# Patient Record
Sex: Female | Born: 1970 | Hispanic: Yes | Smoking: Never smoker
Health system: Southern US, Community
[De-identification: ages and names within clinical notes are randomized; demographics above are authoritative.]

## PROBLEM LIST (undated history)

## (undated) DIAGNOSIS — F419 Anxiety disorder, unspecified: Secondary | ICD-10-CM

## (undated) DIAGNOSIS — A6000 Herpesviral infection of urogenital system, unspecified: Secondary | ICD-10-CM

## (undated) DIAGNOSIS — F32A Depression, unspecified: Secondary | ICD-10-CM

## (undated) DIAGNOSIS — D649 Anemia, unspecified: Secondary | ICD-10-CM

## (undated) DIAGNOSIS — K219 Gastro-esophageal reflux disease without esophagitis: Secondary | ICD-10-CM

## (undated) DIAGNOSIS — F329 Major depressive disorder, single episode, unspecified: Secondary | ICD-10-CM

## (undated) HISTORY — DX: Anemia, unspecified: D64.9

## (undated) HISTORY — DX: Anxiety disorder, unspecified: F41.9

## (undated) HISTORY — PX: KNEE SURGERY: SHX244

## (undated) HISTORY — PX: PELVIC LAPAROSCOPY: SHX162

## (undated) HISTORY — DX: Gastro-esophageal reflux disease without esophagitis: K21.9

## (undated) HISTORY — DX: Depression, unspecified: F32.A

## (undated) HISTORY — DX: Herpesviral infection of urogenital system, unspecified: A60.00

## (undated) HISTORY — DX: Major depressive disorder, single episode, unspecified: F32.9

---

## 2001-07-13 ENCOUNTER — Other Ambulatory Visit: Admission: RE | Admit: 2001-07-13 | Discharge: 2001-07-13 | Payer: Self-pay | Admitting: Gynecology

## 2002-01-03 ENCOUNTER — Inpatient Hospital Stay (HOSPITAL_COMMUNITY): Admission: AD | Admit: 2002-01-03 | Discharge: 2002-01-05 | Payer: Self-pay | Admitting: Gynecology

## 2002-02-16 ENCOUNTER — Other Ambulatory Visit: Admission: RE | Admit: 2002-02-16 | Discharge: 2002-02-16 | Payer: Self-pay | Admitting: Gynecology

## 2003-02-25 ENCOUNTER — Other Ambulatory Visit: Admission: RE | Admit: 2003-02-25 | Discharge: 2003-02-25 | Payer: Self-pay | Admitting: Gynecology

## 2004-02-26 ENCOUNTER — Other Ambulatory Visit: Admission: RE | Admit: 2004-02-26 | Discharge: 2004-02-26 | Payer: Self-pay | Admitting: Gynecology

## 2005-03-02 ENCOUNTER — Other Ambulatory Visit: Admission: RE | Admit: 2005-03-02 | Discharge: 2005-03-02 | Payer: Self-pay | Admitting: Gynecology

## 2005-08-31 ENCOUNTER — Encounter (INDEPENDENT_AMBULATORY_CARE_PROVIDER_SITE_OTHER): Payer: Self-pay | Admitting: Specialist

## 2005-08-31 ENCOUNTER — Ambulatory Visit (HOSPITAL_BASED_OUTPATIENT_CLINIC_OR_DEPARTMENT_OTHER): Admission: RE | Admit: 2005-08-31 | Discharge: 2005-08-31 | Payer: Self-pay | Admitting: Gynecology

## 2006-04-27 ENCOUNTER — Other Ambulatory Visit: Admission: RE | Admit: 2006-04-27 | Discharge: 2006-04-27 | Payer: Self-pay | Admitting: Gynecology

## 2006-06-23 ENCOUNTER — Encounter: Admission: RE | Admit: 2006-06-23 | Discharge: 2006-06-23 | Payer: Self-pay | Admitting: Gynecology

## 2007-05-01 ENCOUNTER — Other Ambulatory Visit: Admission: RE | Admit: 2007-05-01 | Discharge: 2007-05-01 | Payer: Self-pay | Admitting: Gynecology

## 2007-06-26 ENCOUNTER — Encounter: Admission: RE | Admit: 2007-06-26 | Discharge: 2007-06-26 | Payer: Self-pay | Admitting: Gynecology

## 2008-07-12 HISTORY — PX: INTRAUTERINE DEVICE INSERTION: SHX323

## 2008-11-28 ENCOUNTER — Encounter: Payer: Self-pay | Admitting: Gynecology

## 2008-11-28 ENCOUNTER — Ambulatory Visit: Payer: Self-pay | Admitting: Gynecology

## 2008-11-28 ENCOUNTER — Other Ambulatory Visit: Admission: RE | Admit: 2008-11-28 | Discharge: 2008-11-28 | Payer: Self-pay | Admitting: Gynecology

## 2009-03-31 ENCOUNTER — Ambulatory Visit: Payer: Self-pay | Admitting: Gynecology

## 2009-04-28 ENCOUNTER — Ambulatory Visit: Payer: Self-pay | Admitting: Gynecology

## 2010-02-16 ENCOUNTER — Encounter: Admission: RE | Admit: 2010-02-16 | Discharge: 2010-02-16 | Payer: Self-pay | Admitting: Family Medicine

## 2010-08-13 ENCOUNTER — Other Ambulatory Visit (HOSPITAL_COMMUNITY)
Admission: RE | Admit: 2010-08-13 | Discharge: 2010-08-13 | Disposition: A | Discharge status: Home / Self Care | Payer: BC Managed Care – PPO | Source: Ambulatory Visit | Attending: Gynecology | Admitting: Gynecology

## 2010-08-13 ENCOUNTER — Encounter: Payer: BC Managed Care – PPO | Admitting: Gynecology

## 2010-08-13 ENCOUNTER — Other Ambulatory Visit: Payer: Self-pay | Admitting: Gynecology

## 2010-08-13 DIAGNOSIS — Z13220 Encounter for screening for lipoid disorders: Secondary | ICD-10-CM

## 2010-08-13 DIAGNOSIS — Z124 Encounter for screening for malignant neoplasm of cervix: Secondary | ICD-10-CM | POA: Insufficient documentation

## 2010-08-13 DIAGNOSIS — R5381 Other malaise: Secondary | ICD-10-CM

## 2010-08-13 DIAGNOSIS — B3731 Acute candidiasis of vulva and vagina: Secondary | ICD-10-CM

## 2010-08-13 DIAGNOSIS — R5383 Other fatigue: Secondary | ICD-10-CM

## 2010-08-13 DIAGNOSIS — Z01419 Encounter for gynecological examination (general) (routine) without abnormal findings: Secondary | ICD-10-CM

## 2010-08-13 DIAGNOSIS — Z833 Family history of diabetes mellitus: Secondary | ICD-10-CM

## 2010-08-13 DIAGNOSIS — B373 Candidiasis of vulva and vagina: Secondary | ICD-10-CM

## 2010-09-21 ENCOUNTER — Other Ambulatory Visit: Payer: Self-pay | Admitting: Gastroenterology

## 2010-09-21 DIAGNOSIS — R7989 Other specified abnormal findings of blood chemistry: Secondary | ICD-10-CM

## 2010-09-25 ENCOUNTER — Ambulatory Visit
Admission: RE | Admit: 2010-09-25 | Discharge: 2010-09-25 | Disposition: A | Discharge status: Home / Self Care | Payer: BC Managed Care – PPO | Source: Ambulatory Visit | Attending: Gastroenterology | Admitting: Gastroenterology

## 2010-09-25 DIAGNOSIS — R7989 Other specified abnormal findings of blood chemistry: Secondary | ICD-10-CM

## 2010-11-27 NOTE — Discharge Summary (Signed)
   Christy Haley, Christy Haley                         ACCOUNT NO.:  [id number]   MEDICAL RECORD NO.:  [id number]                   PATIENT TYPE:  NP   LOCATION:  9133                                 FACILITY:  [facility]   PHYSICIAN:  Devin M. Ciliberti, M.D.            DATE OF BIRTH:  Jan 15, 1971   DATE OF ADMISSION:  01/03/2002  DATE OF DISCHARGE:  01/05/2002                                 DISCHARGE SUMMARY   DISCHARGE DIAGNOSES:  1. Intrauterine pregnancy at term.  2. Spontaneous onset of labor.   PROCEDURE:  Normal spontaneous vaginal delivery of viable infant, over an  intact perineum with repair of second degree vaginal perineal tear.   HISTORY OF PRESENT ILLNESS:  The patient is a 40 year old gravida 3, para 1-  1-0-1, LMP April 05, 2001.  [facility] January 10, 2002.   LABORATORY DATA:  Blood type O-positive.  Antibody screen negative.  RPR and  HBsAg have been nonreactive and AFP normal.   HOSPITAL COURSE:  The patient was admitted on January 03, 2002, with  spontaneous onset of labor.  She progressed to complete dilatation,  delivered an Apgar 9/10 female infant, weight 7 pounds 6 ounces over intact  perineum with repair of second degree vaginal perineal tear.  Postpartum  course was benign.  She remained afebrile and had no difficulty voiding.  Was able to be discharged in satisfactory condition on her second postpartum  day.  CBC: hematocrit 31.6, hemoglobin 10.4, WBC 10.4, platelets 199.   DISPOSITION:  Followup in six weeks.  Continue prenatal vitamins, Motrin,  and Tylox for pain.     Elwyn Lade . Hancock, N.P.                Devin M. Ciliberti, M.D.    MKH/MEDQ  D:  02/08/2002  T:  02/14/2002  Job:  [id number]

## 2010-11-27 NOTE — Op Note (Signed)
NAME:  Christy Haley, Christy Haley     ACCOUNT NO.:  [id number]   MEDICAL RECORD NO.:  [id number]          PATIENT TYPE:  AMB   LOCATION:  [facility]                         FACILITY:  [facility]   PHYSICIAN:  Juan H. Lily Peer, M.D.DATE OF BIRTH:  1971/06/22   DATE OF PROCEDURE:  08/31/2005  DATE OF DISCHARGE:                                 OPERATIVE REPORT   SURGEON:  Juan H. Lily Peer, M.D.   ASSISTANT:  Ivor Costa. Farrel Gobble, M.D.   INDICATIONS:  A 40 year old, gravida 3, para 2, AB 1 with a persistent left  adnexal mass measuring 6.6 x 6.9 x 4.6 cm, echo-free and avascular.   PREOPERATIVE DIAGNOSIS:  Left adnexal mass.   POSTOPERATIVE DIAGNOSIS:  Left ovarian cyst.   OPERATION/PROCEDURE:  1.  Pelvic washings.  2.  Left ovarian cyst aspiration.  3.  Left ovarian cystectomy.   DESCRIPTION OF PROCEDURE:  After the patient was adequately counseled, she  was taken to the operating room where she underwent a successful general  endotracheal anesthesia.  Her abdomen, vagina and perineum were prepped and  draped in the usual sterile fashion.  A Foley catheter was then placed and  in an effort to monitor uterine output.  A ring forceps with a gauze was  placed into the vaginal vault due to the fact that the patient has a Mirena  IUD so a Hulka tenaculum was not utilized.  The patient was placed in the  high lithotomy position.  A small stab incision was made at the umbilicus  followed by insertion of a Veress needle.  Opening intraabdominal pressure  was 80 mmHg and approximately 3.5 L of carbon dioxide was placed in the  peritoneal cavity.  The Veress needle was removed and a 10 mm trocar was  inserted and under laparoscopic guidance, two additional 5 mm ports were  introduced approximately four fingerbreadths from the midline in the lower  abdomen.  A systematic inspection demonstrated a left ovarian cyst that  measured approximately 5 cm in size, normal tubes bilaterally with lush  fimbriated  end.  Contralateral ovary was normal.  There was amber-colored  fluid in the cul-de-sac which was aspirated after copious irrigation of the  pelvic cavity and fluid submitted for cytology.   Attention was then placed to the left utero-ovarian ligament which was  placed under tension and the ovary capsule was incised after the cyst had  been aspirated for approximately 7 mL of amber-colored fluid.  After the  cyst decompressed, the cyst wall was removed and excised and passed off the  operative field for histologic evaluation.  In the remaining ovarian bed,  small bleeders were cauterized.  After ascertaining adequate hemostasis, the  remainder of the pelvic cavity was copiously irrigated with normal saline  solution.  After the carbon dioxide was removed, the instruments were  removed.  The subumbilical fascia was closed with figure-of-eight and 0  Vicryl suture and a subcuticular stitch.  An 0 plain catgut was utilized.  Two 5 mm ports sites were reapproximated with Dermabond glue.  Marcaine  0.25% was utilized for postoperative analgesia for a total of 10 mL.  Of  note, the patient  had PSA  stockings.  She received 1 g of Cefotan for prophylaxis.  The patient  received 30 mg of Toradol and was transferred to the recovery room with  stable vital signs.  Blood loss was minimal.  Fluid resuscitation consisted  of 1500 mL of lactated Ringer's for an outpatient of approximately 200 mL.      Juan H. Lily Peer, M.D.  Electronically Signed     JHF/MEDQ  D:  08/31/2005  T:  08/31/2005  Job:  [id number]

## 2010-11-27 NOTE — H&P (Signed)
NAME:  Christy Haley, Christy Haley NO.:  [id number]   MEDICAL RECORD NO.:  [id number]          PATIENT TYPE:   LOCATION:                                 FACILITY:   PHYSICIAN:  Juan H. Lily Peer, M.D.     DATE OF BIRTH:   DATE OF ADMISSION:  08/31/2005  DATE OF DISCHARGE:                                HISTORY & PHYSICAL   DATE OF SCHEDULED SURGERY:  Tuesday, August 31, 2005, at 1 p.m. at [facility].   CHIEF COMPLAINT:  Left adnexal  mass.   HISTORY:  The patient is a 40 year old gravida 3, para 2, Ab 1 who was seen  in the office on August 22 for annual gynecological examination. The patient  with a Mirena IUD placed in September 2005 for contraception. At the time of  her exam, a left adnexal fullness was noted and the patient subsequently was  asked to return for an ultrasound, which she did on September 7. The  ultrasound demonstrated a thin-walled echo-free cyst measuring 30 x 35 x 35  mm and avascular on the left. The right ovary was normal and the IUD was in  place. She was asked to return back in 3 months, thinking that this perhaps  is a functional cyst. She returned back to the office on January 9  complaining of chronic left lower quadrant pain and the ultrasound had  demonstrated that the cyst had increased in size to a measurement of 66 x 69  x 46 mm, echo-free, and avascular. The patient is scheduled to undergo  laparoscopic left ovarian cystectomy with possible left salpingo-  oophorectomy.   PAST MEDICAL HISTORY:  She has been pregnant three times, has two children  delivered vaginally, one miscarriage. She has an IUD in place. She denies  any allergies. Grandmother with history of diabetes. Otherwise, she has been  healthy.   PHYSICAL EXAMINATION:  VITAL SIGNS:  The patient weighs 160 pounds, height 5  feet 3 inches tall. Blood pressure 120/62.  HEENT:  Unremarkable.  NECK:  Supple, trachea midline. No carotid bruits, no  thyromegaly.  LUNGS:  Clear to auscultation without rhonchi or wheezes.  HEART:  Regular rate and rhythm, no murmurs or gallop.  BREAST:  Exam not done.  ABDOMEN:  Soft and nontender without rebound or guarding.  PELVIC:  Bartholin, urethra, Skene glands within normal limits. Vagina and  cervix:  No gross lesion on inspection. Uterus:  Anteverted; normal size,  shape and consistency, and left adnexal.  RECTAL:  Confirmatory.   ASSESSMENT:  A 40 year old gravida 3 para 2 abortus 1 with Mirena  intrauterine device in place. At time of annual gynecological examination on  March 02, 2005, was found to have a left adnexal fullness, followed up with  an ultrasound on September 7 with dimensions consistent with a 3.5 cm left  ovarian cyst and avascular. The patient returned to the office on July 20, 2005, for a follow-up ultrasound and the cyst had doubled in size. It was  measuring 66 x 69 x 46 mm, echo-free, and avascular. The patient is  scheduled to undergo a laparoscopic left ovarian  cystectomy, possible left  salpingo-oophorectomy. The risks, benefits, and pros and cons were discussed  with the patient to include the risk of infection although she will receive  prophylactic antibiotic; the risk for deep venous thrombosis, she will have  PSA stockings; also, the risk for hemorrhage and possible need for blood  products and blood transfusion with a potential risk of anaphylactic  reaction, hepatitis, and AIDS; and finally, in the event of any technical  difficulty gaining entrance to the abdominal cavity laparoscopically, an  open laparotomy technique may need to be utilized to complete the surgery;  also, potential risk for trauma to internal organs as discussed as well. The  patient could potentially lose her left tube and ovary and will still have  her right tube and ovary remaining. All these issues were discussed with the  patient, all questions were answered, and will follow  accordingly.   PLAN:  The patient is scheduled for laparoscopic left ovarian cystectomy,  possible left salpingo-oophorectomy, Tuesday, August 31, 2005, at 1 p.m.  at [facility].      Juan H. Lily Peer, M.D.  Electronically Signed     JHF/MEDQ  D:  08/30/2005  T:  08/30/2005  Job:  [id number]

## 2011-04-12 ENCOUNTER — Encounter: Payer: Self-pay | Admitting: Gynecology

## 2011-04-12 ENCOUNTER — Other Ambulatory Visit (HOSPITAL_COMMUNITY)
Admission: RE | Admit: 2011-04-12 | Discharge: 2011-04-12 | Disposition: A | Discharge status: Home / Self Care | Payer: BC Managed Care – PPO | Source: Ambulatory Visit | Attending: Gynecology | Admitting: Gynecology

## 2011-04-12 ENCOUNTER — Ambulatory Visit (INDEPENDENT_AMBULATORY_CARE_PROVIDER_SITE_OTHER): Payer: BC Managed Care – PPO | Admitting: Gynecology

## 2011-04-12 ENCOUNTER — Encounter: Payer: Self-pay | Admitting: Anesthesiology

## 2011-04-12 VITALS — BP 114/72

## 2011-04-12 DIAGNOSIS — R87619 Unspecified abnormal cytological findings in specimens from cervix uteri: Secondary | ICD-10-CM

## 2011-04-12 DIAGNOSIS — Z01419 Encounter for gynecological examination (general) (routine) without abnormal findings: Secondary | ICD-10-CM | POA: Insufficient documentation

## 2011-04-12 NOTE — Progress Notes (Signed)
Patient 40 year old gravida 3 para 2 AB 1 who presented to the office today is instructed as a result of her Pap smear done at time of her annual exam with every second 2012 which demonstrated benign reactive reparative changes although negative for intraepithelial lesions or malignancy. She is doing well otherwise with regular cycles and has a Mirena IUD that was placed in 04/05/2009. She is to schedule mammogram the next few weeks. She is instructed to continue monthly self breast examination. Her Pap smear was repeated today. She'll return back in 6 months for routine schedule annual gynecological examination or when necessary. Of note the IUD string was seen at time of the Pap smear today.

## 2011-04-12 NOTE — Patient Instructions (Signed)
Recuerdate de hacer cita para el mammograma. Proxima cita para el annual con Dr. Lily Peer en Abril 2013

## 2011-04-14 ENCOUNTER — Other Ambulatory Visit: Payer: Self-pay | Admitting: Gynecology

## 2011-04-14 DIAGNOSIS — Z1231 Encounter for screening mammogram for malignant neoplasm of breast: Secondary | ICD-10-CM

## 2011-05-13 ENCOUNTER — Ambulatory Visit
Admission: RE | Admit: 2011-05-13 | Discharge: 2011-05-13 | Disposition: A | Discharge status: Home / Self Care | Payer: BC Managed Care – PPO | Source: Ambulatory Visit | Attending: Gynecology | Admitting: Gynecology

## 2011-05-13 ENCOUNTER — Ambulatory Visit: Payer: BC Managed Care – PPO

## 2011-05-13 DIAGNOSIS — Z1231 Encounter for screening mammogram for malignant neoplasm of breast: Secondary | ICD-10-CM

## 2011-10-12 ENCOUNTER — Encounter: Payer: BC Managed Care – PPO | Admitting: Gynecology

## 2012-01-06 DIAGNOSIS — K219 Gastro-esophageal reflux disease without esophagitis: Secondary | ICD-10-CM | POA: Insufficient documentation

## 2012-01-14 ENCOUNTER — Ambulatory Visit (INDEPENDENT_AMBULATORY_CARE_PROVIDER_SITE_OTHER): Payer: BC Managed Care – PPO | Admitting: Gynecology

## 2012-01-14 ENCOUNTER — Encounter: Payer: Self-pay | Admitting: Gynecology

## 2012-01-14 VITALS — BP 120/78 | Ht 62.25 in | Wt 142.0 lb

## 2012-01-14 DIAGNOSIS — Z8489 Family history of other specified conditions: Secondary | ICD-10-CM

## 2012-01-14 DIAGNOSIS — Z833 Family history of diabetes mellitus: Secondary | ICD-10-CM | POA: Insufficient documentation

## 2012-01-14 DIAGNOSIS — N898 Other specified noninflammatory disorders of vagina: Secondary | ICD-10-CM

## 2012-01-14 DIAGNOSIS — D72819 Decreased white blood cell count, unspecified: Secondary | ICD-10-CM

## 2012-01-14 DIAGNOSIS — R635 Abnormal weight gain: Secondary | ICD-10-CM

## 2012-01-14 DIAGNOSIS — Z01419 Encounter for gynecological examination (general) (routine) without abnormal findings: Secondary | ICD-10-CM

## 2012-01-14 LAB — COMPREHENSIVE METABOLIC PANEL
ALT: 12 U/L (ref 0–35)
AST: 15 U/L (ref 0–37)
Albumin: 4.2 g/dL (ref 3.5–5.2)
Alkaline Phosphatase: 34 U/L — ABNORMAL LOW (ref 39–117)
BUN: 22 mg/dL (ref 6–23)
CO2: 28 mEq/L (ref 19–32)
Calcium: 9.3 mg/dL (ref 8.4–10.5)
Chloride: 105 mEq/L (ref 96–112)
Creat: 0.68 mg/dL (ref 0.50–1.10)
Glucose, Bld: 87 mg/dL (ref 70–99)
Potassium: 4.6 mEq/L (ref 3.5–5.3)
Sodium: 139 mEq/L (ref 135–145)
Total Bilirubin: 0.5 mg/dL (ref 0.3–1.2)
Total Protein: 6.3 g/dL (ref 6.0–8.3)

## 2012-01-14 LAB — LIPID PANEL
Cholesterol: 139 mg/dL (ref 0–200)
HDL: 55 mg/dL (ref >39)
LDL Cholesterol: 76 mg/dL (ref 0–99)
Total CHOL/HDL Ratio: 2.5 Ratio
Triglycerides: 41 mg/dL (ref <150)
VLDL: 8 mg/dL (ref 0–40)

## 2012-01-14 LAB — WET PREP FOR TRICH, YEAST, CLUE
Trich, Wet Prep: NONE SEEN
Yeast Wet Prep HPF POC: NONE SEEN

## 2012-01-14 LAB — CBC WITH DIFFERENTIAL/PLATELET
Basophils Absolute: 0 10*3/uL (ref 0.0–0.1)
Basophils Relative: 0 % (ref 0–1)
Eosinophils Absolute: 0.1 10*3/uL (ref 0.0–0.7)
Eosinophils Relative: 2 % (ref 0–5)
HCT: 36.6 % (ref 36.0–46.0)
Hemoglobin: 12.1 g/dL (ref 12.0–15.0)
Lymphocytes Relative: 30 % (ref 12–46)
Lymphs Abs: 1 10*3/uL (ref 0.7–4.0)
MCH: 27.7 pg (ref 26.0–34.0)
MCHC: 33.1 g/dL (ref 30.0–36.0)
MCV: 83.8 fL (ref 78.0–100.0)
Monocytes Absolute: 0.3 10*3/uL (ref 0.1–1.0)
Monocytes Relative: 9 % (ref 3–12)
Neutro Abs: 1.9 10*3/uL (ref 1.7–7.7)
Neutrophils Relative %: 59 % (ref 43–77)
Platelets: 283 10*3/uL (ref 150–400)
RBC: 4.37 MIL/uL (ref 3.87–5.11)
RDW: 13.8 % (ref 11.5–15.5)
WBC: 3.1 10*3/uL — ABNORMAL LOW (ref 4.0–10.5)

## 2012-01-14 LAB — TSH: TSH: 1.246 u[IU]/mL (ref 0.350–4.500)

## 2012-01-14 NOTE — Patient Instructions (Addendum)
Mantenimiento de Engineer, maintenance (IT) las mujeres (Health Maintenance, Females) Un estilo de vida saludable y los cuidados preventivos pueden favorecer la salud y [place].   Haga exmenes regulares de la salud en general, dentales y de los ojos.   Consuma una dieta saludable. Los [employer], frutas, granos enteros, productos lcteos descremados y protenas magras contienen los nutrientes que usted necesita sin necesidad de consumir muchas caloras. Disminuya el consumo de alimentos con alto contenido de grasas slidas, azcar y sal agregadas. Si es necesario, pdaleinformacin acerca de una dieta [place] a su mdico.   La actividad fsica regular es una de las cosas ms importantes que puede hacer por su salud. Los adultos deben hacer al menos 150 minutos de ejercicios de intensidad moderada (cualquier actividad que aumente la frecuencia cardaca y lo haga transpirar) cada semana. Adems, la [employer]son de los adultos necesita ejercicios de fortalecimiento muscular 2  ms [employer].    Mantenga un peso saludable. El ndice de masa corporal [facility]) es una herramienta que identifica posibles problemas con [place]. Proporciona una estimacin de la grasa corporal basndose en el peso y la altura. El mdico podr determinar su [facility] y podr ayudarlo a Personnel officer o Pharmacologist un peso saludable. Para los adultos de 20 aos o ms:   Un [facility] menor a 18,5 se considera bajo peso.   Un [facility] entre 18,5 y 24,9 es normal.   Un [facility] entre 25 y 29,9 es sobrepeso.   Un IMC entre 30 o ms es obesidad.   Mantenga un nivel normal de lpidos y colesterol en sangre practicando actividad fsica y minimizando la ingesta de grasas saturadas. Consuma una dieta balanceada e incluya variedad de frutas y vegetales. Los [employer] de lpidos y Oncologist en sangre deben Games developer a los 20 aos y repetirse cada 5 aos. Si los niveles de colesterol son altos, tiene ms de 50 aos o tiene riesgo elevado de sufrir enfermedades  cardacas, Pension scheme manager controlarse con ms frecuencia.Si tiene [employer] de lpidos y colesterol, debe recibir tratamiento con medicamentos, si la dieta y el ejercicio no son efectivos.   Si fuma, consulte con [place] [employer] de las opciones para dejar de [place]. Si no lo hace, no comience.   Si est embarazada no beba alcohol. Si est amamantando, beba alcohol con prudencia. Si elige beber alcohol, no se exceda de 1 medida por da. Se considera una medida a 12 onzas (355 ml) de cerveza, 5 onzas (148 ml) de vino, o 1,5 onzas (44 ml) de licor.   Evite el alcohol y el consumo de drogas. No comparta agujas. Pida ayuda si necesita asistencia o instrucciones con respecto a abandonar el consumo de alcohol, cigarrillos o drogas.   La hipertensin arterial causa enfermedades cardacas y [place] el riesgo de ictus. Debe controlar su presin arterial al menos cada 1 o 2 aos. La presin arterial elevada que persiste debe tratarse con medicamentos si la prdida de peso y el ejercicio no son efectivos.   Si tiene entre 55 y 63 aos, consulte a su mdico si debe tomar aspirina para prevenir enfermedades cardacas.   Los anlisis para la diabetes incluyen la toma de [place] de sangre para controlar el nivel de azcar en la sangre durante el [employer]. Debe hacerlo cada 3 aos despus de los 45 aos si est dentro de su peso normal y sin factores de riesgo para la diabetes. Las pruebas deben comenzar a edades tempranas o llevarse a cabo con ms frecuencia  si tiene sobrepeso y al menos 1 factor de riesgo para la diabetes.   Las evaluaciones para Market researcher de mama son un mtodo preventivo fundamental para las mujeres. Debe practicar la "autoconciencia de las mamas". Esto significa que Product/process development scientist apariencia normal de sus mamas y [employer] siente y pudiendo incluir un autoexamen de Building control surveyor. Si detecta algn cambio, no importa cun pequeo sea, debe informarlo a su mdico. Las mujeres entre 20 y  40 aos deben hacer un examen clnico de las mamas como parte del examen regular de [place], cada 1 a 3 aos. Despus de los 40 aos deben [employer]. Deben hacerse una mamografa radografa de mamas ) cada ao, comenzando a los 40 aos. Las mujeres con Brewing technologist de cncer de mama deben hablar con el mdico para hacer un estudio gentico. Las que tienen ms riesgo deben Geographical information systems officer resonancia magntica y [employer] todos [place].   Un test de Pap se realiza para diagnosticar cncer de cuello de tero. Las mujeres deben [employer] un test de Pap a partir de los [address]. [employer] 21 y los 29 aos debe repetirse [address]. Luego de los 30 aos, debe realizarse un test de Pap cada tres aos siempre que los 3 estudios anteriores sean normales. Si le han realizado una histerectoma por un problema que no era cncer u otra enfermedad que podra causar cncer, ya no necesitar un test de Pap. Si tiene entre 65 y 43 aos y ha tenido Scientist, research (physical sciences) de Pap normal en los ltimos 10 aos, ya no ser Music therapist. Si ha recibido un tratamiento para Management consultant cervical o para una enfermedad que podra causar cncer, Musician un test de Pap y controles durante al menos 20 aos de concluir el Olivia Lopez de Gutierrez. Si no se ha Patent attorney con regularidad, Writer a evaluarse los factores de riesgo (como el tener un nuevo compaero sexual) para Occupational psychologist a Printmaker. Algunas mujeres sufren problemas mdicos que aumentan la probabilidad de Research officer, political party cervical. En estos casos, el mdico podr indicar que se realice el test de Pap con ms frecuencia.   La prueba del virus del Geneticist, molecular (VPH) es un anlisis adicional que puede usarse para Engineer, site de cuello de tero. Esta prueba busca la presencia del virus que causa los cambios en el cuello. Las clulas que se recolectan durante el test de Pap pueden usarse para el VPH. La prueba para el VPH puede  usarse para Development worker, community a mujeres de ms de 30 aos y debe usarse en mujeres de cualquier edad Cisco del test de Pap no sean claros. Despus de los 30 aos, las mujeres deben hacerse el anlisis para el VPH con la misma frecuencia que el test de Pap.   El cncer colorectal puede detectarse y con frecuencia puede prevenirse. La mayor parte de los estudios de rutina comienzan a los 50 aos y [place] 75 aos. Sin embargo, el mdico podr aconsejarle que lo haga antes, si tiene factores de riesgo para el cncer de colon. Una vez por ao, el profesional le dar un kit de prueba para Scientist, product/process development en la materia fecal. La utilizacin de un tubo con una pequea cmara en su extremo para examinar directamente el colon (sigmoidoscopa o colonoscopa), puede detectar formas temprana de cncer colorectal. Hable con su mdico si tiene 50 aos, cuando comience con los estudios de [place]. El examen directo del  colon debe repetirse cada 5 a 10 aos, hasta los 75 aos, excepto que se encuentren formas tempranas de plipos precancerosos o pequeos bultos.   Se recomienda realizar un anlisis de sangre para Engineer, manufacturing hepatitis C a todas las personas [address] 1945 y 1965, y a todo aquel que tenga un riesgo conocido de haber contrado esta enfermedad.   Practique el sexo seguro. Use condones y evite las prcticas sexuales riesgosas para disminuir el contagio de enfermedades de transmisin sexual. Las mujeres sexualmente activas de 25 aos o menos deben controlarse para descartar clamidia, que es una infeccin de transmisin sexual frecuente. Las [employer] que tengan mltiples compaeros tambin deben hacerse el anlisis para Engineer, manufacturing clamidia. Se recomienda realizar anlisis para detectar otras enfermedades de transmisin sexual si es sexualmente [place] y tiene riesgos.   La osteoporosis es una enfermedad en la que los huesos pierden los minerales y la fuerza por el avance de la edad. El  resultado pueden ser fracturas graves en los [place]. El riesgo de osteoporosis puede identificarse con Neomia Dear prueba de densidad sea. Las mujeres de ms de 65 aos y las que tengan riesgos de sufrir fracturas u osteoporosis deben pedir consejo a su mdico. Consulte a su mdico si debe tomar un suplemento de calcio o de vitamina D para reducir el riesgo de osteoporosis.  La menopausia se asocia a sntomas y riesgos fsicos. Se dispone de una terapia de reemplazo hormonal para disminuir los sntomas y [place]. Consulte a su mdico para saber si laVaginosis bacteriana (Bacterial Vaginosis) La vaginosis bacteriana es una infeccin vaginal en la que el equilibrio normal de las bacterias de la vagina se modifica. Este equilibrio normal se ve afectado por un desarrollo excesivo de ciertas bacterias. Hay diferentes tipos de bacteria que causan la vaginosis bacteriana. Es el problema vaginal ms comn en las mujeres de edad frtil. CAUSAS La causa de este trastorno no se conoce bien. Se produce como consecuencia de un aumento o desequilibrio de las bacterias nocivas.  Algunas actividades o conductas pueden poner en peligro el equilibrio normal de las bacterias en la vagina, y Astronomer. Entre ellas:  Tener un compaero sexual o mltiples compaeros sexuales.  Las duchas vaginales  Usar un dispositivo intrauterino (DIU) como mtodo anticonceptivo.  No se conoce el papel que juega la actividad sexual en el desarrollo de [place] VB. Sin embargo, las mujeres que nunca tuvieron relaciones sexuales raramente se infectan.  El contagio no se produce en asientos de baos, camas, piscinas o por tocar objetos.  SNTOMAS Flujo vaginal grisceo.  Olor parecido al pescado con la secrecin, en especial despus de Management consultant.  Picazn o irritacin de la vagina y la vulva.  Ardor o dolor al [employer].  Algunas mujeres no presentan ningn sntoma.  DIAGNSTICO El mdico realizar un examen vaginal para  diagnosticar una vaginosis bacteriana. El mdico le indicar anlisis de laboratorio y observar las muestras del lquido vaginal en el microscopio. Buscar bacterias y clulas anormales (clulas clave), pH mayor a 4.5 y [place] prueba de aminas positivo, todos ellos asociados al BV.  RIESGOS Y COMPLICACIONES Enfermedad plvica inflamatoria (EPI).  Infecciones luego de una ciruga ginecolgica.  VIH.  Virus del Herpes  TRATAMIENTO En algunos casos, la infeccin desaparece sin tratamiento. Sin embargo, todas las mujeres con sntomas de VB deben tratarse para evitar complicaciones, especialmente si se ha planificado una ciruga ginecolgica. Los compaeros varones generalmente no necesitan tratamiento. Sin embargo, puede contagiarse entre parejas femeninas, de [place]  el tratamiento se realiza para Dietitian.  La VB puede tratarse con medicamentos que destruyen grmenes (antibiticos). Estos se presentan en pldoras o en cremas vaginales. Tanto mujeres embarazadas como no embarazadas pueden usar ambos, pero se indican en dosis diferentes. Estos antibiticos no daan al beb.  La VB puede recurrir [employer]. Si esto ocurre, se prescribir un segundo tratamiento con antibiticos.  El tratamiento es importante en el caso de las mujeres [place]. Si no se trata, la VB puede causar Coca[employer]a, especialmente en [employer] que ha tenido un parto prematuro en el pasado. Todas las mujeres embarazadas que tienen sntomas de VB deben ser controladas y tratadas.  En los casos de recurrencia crnica, se prescribe un tratamiento con un gel vaginal dos veces por semana  INSTRUCCIONES PARA EL CUIDADO DOMICILIARIO Tome los medicamentos que le indic el mdico.  No mantenga relaciones sexuales Librarian, academic.  Comunique a sus compaeros sexuales que sufre una infeccin vaginal. Ellos deben concurrir para un control mdico si tienen problemas como una urticaria leve o picazn.    Practique el sexo seguro. Use preservativos. Tenga un solo compaero sexual.  PREVENCIN Algunos pasos bsicos de prevencin pueden ayudar a reducir el riesgo de desequilibrio de las bacterias vaginales y de sufrir VB. No mantener relaciones sexuales (abstinencia)  No utilice duchas vaginales.  Utilice todos los Cardinal Health han prescripto para el [place], aunque los sntomas hayan desaparecido.  Comunique a su compaero sexual que sufre una VB. De ese modo podr tratase, si es necesario, y podr Economist.  SOLICITE ATENCIN MDICA SI: Los sntomas no mejoran luego de [address]  [address] de [place].  Aumentan la secrecin, el dolor o la fiebre.  ASEGRESE QUE:  Comprende estas instrucciones.  Controlar su enfermedad.  Solicitar ayuda de inmediato si no mejora o empeora.  PARA MS INFORMACIN: Division de STD Prevention (DSTDP), Centers for Disease Control and Prevention (Centros para el control y la prevencin de enfermedades, CDC): SolutionApps.co.za American Social Health Association (ASHA): www.ashastd.org  Document Released: 10/05/2007 Document Revised: 06/17/2011  [facility] Patient Information 2012 [place], [place]. terapia de reemplazo hormonal es conveniente para usted.   Use una pantalla solar con un factor SPF de 30 o mayor. Aplique pantalla de Pietro Cassis y repetida a lo largo del Futures trader. Pngase al resguardo del sol cuando la sombra sea ms pequea que usted. Protjase usando mangas y [employer], un sombrero de ala ancha y gafas para el sol todo el ao, siempre que se encuentre en el exterior.   Informe a su mdico si aparecen nuevos lunares o los que tiene se modifican, especialmente en forma y color. Tambin notifique al mdico si un lunar es ms grande que el tamao de una goma de Paramedic.   Mantngase al da con las vacunas.  Document Released: 06/17/2011 [facility] Patient Information 2012 [place], [place].

## 2012-01-14 NOTE — Progress Notes (Signed)
Christy Haley 05-03-1971 [id number]   History:    41 y.o.  for annual gyn exam who stated that and she had recently seen her gastroenterologist and was told that she had leukopenia. We do not have those results. Patient has a Mirena IUD placed in 2010. She at time suffers from leukorrhea. Her cycles otherwise regular. She does suffer from mild melasma which she has for many years even before she had a Mirena IUD placed. She is exposed sunlight quite often. Her last mammogram was normal November 2012. Patient does her monthly self breast examination. Review of her record indicated she was weighing 129 is up to 142 now.  Past medical history,surgical history, family history and social history were all reviewed and documented in the EPIC chart.  Gynecologic History No LMP recorded. Patient is not currently having periods (Reason: IUD). Contraception: IUD Last Pap: 2012. Results were: normal Last mammogram: November 2012. Results were: normal  Obstetric History OB History    Grav Para Term Preterm Abortions TAB SAB Ect Mult Living   3 2   1  1   2      # Outc Date GA Lbr Len/2nd Wgt Sex Del Anes PTL Lv   1 PAR     F SVD      2 PAR     F SVD      3 SAB                ROS: A ROS was performed and pertinent positives and negatives are included in the history.  GENERAL: No fevers or chills. HEENT: No change in vision, no earache, sore throat or sinus congestion. NECK: No pain or stiffness. CARDIOVASCULAR: No chest pain or pressure. No palpitations. PULMONARY: No shortness of breath, cough or wheeze. GASTROINTESTINAL: No abdominal pain, nausea, vomiting or diarrhea, melena or bright red blood per rectum. GENITOURINARY: No urinary frequency, urgency, hesitancy or dysuria. MUSCULOSKELETAL: No joint or muscle pain, no back pain, no recent trauma. DERMATOLOGIC: No rash, no itching, no lesions. ENDOCRINE: No polyuria, polydipsia, no heat or cold intolerance. No recent change in weight. HEMATOLOGICAL: No  anemia or easy bruising or bleeding, leukopenia?Marland Kitchen NEUROLOGIC: No headache, seizures, numbness, tingling or weakness. PSYCHIATRIC: No depression, no loss of interest in normal activity or change in sleep pattern.     Exam: chaperone present  BP 120/78  Ht 5' 2.25" (1.581 m)  Wt 142 lb (64.411 kg)  BMI 25.76 kg/m2  Body mass index is 25.76 kg/(m^2).  General appearance : Well developed well nourished female. No acute distress HEENT: Neck supple, trachea midline, no carotid bruits, no thyroidmegaly Lungs: Clear to auscultation, no rhonchi or wheezes, or rib retractions  Heart: Regular rate and rhythm, no murmurs or gallops Breast:Examined in sitting and supine position were symmetrical in appearance, no palpable masses or tenderness,  no skin retraction, no nipple inversion, no nipple discharge, no skin discoloration, no axillary or supraclavicular lymphadenopathy Abdomen: no palpable masses or tenderness, no rebound or guarding Extremities: no edema or skin discoloration or tenderness  Pelvic:  Bartholin, Urethra, Skene Glands: Within normal limits             Vagina: No gross lesions or discharge, odor  Cervix: No gross lesions or discharge, IUD string seen  Uterus  anteverted, normal size, shape and consistency, non-tender and mobile  Adnexa  Without masses or tenderness  Anus and perineum  normal   Rectovaginal  normal sphincter tone without palpated masses or tenderness  Hemoccult not done   Wet prep: Is is to count bacteria and positive amine  Assessment/Plan:  41 y.o. female for annual exam we discussed today the new Pap smear screening guidelines and will not need one for 2 more years. Because of her weight gain in her family history of diabetes we'll do a fasting lipid profile today along with a comprehensive metabolic panel, CBC, urinalysis, and TSH. She was concerned about potential long-term affects from Nexium such as fractures and bone instability. We discussed  importance of calcium and vitamin D for osteoporosis prevention and her to continue to engage in weightbearing exercises for 45 minutes 3-4 times a week. She wanted to be referred to a different gastroenterologist in the name will be provided as well as for a new internist since her previous one recently retired. Patient will be prescribed Flagyl 500 mg to take 1 by mouth twice a day for 5 days for bacterial vaginosis. We'll wait for the results of the CBC and look at her white blood count and if it does indeed demonstrate severe leukopenia she will need to be referred to hematologist.   Ok Edwards MD, 9:04 AM 01/14/2012

## 2012-01-15 LAB — URINALYSIS W MICROSCOPIC + REFLEX CULTURE
Bacteria, UA: NONE SEEN
Bilirubin Urine: NEGATIVE
Casts: NONE SEEN
Crystals: NONE SEEN
Glucose, UA: NEGATIVE mg/dL
Hgb urine dipstick: NEGATIVE
Ketones, ur: NEGATIVE mg/dL
Leukocytes, UA: NEGATIVE
Nitrite: NEGATIVE
Protein, ur: NEGATIVE mg/dL
Specific Gravity, Urine: 1.017 (ref 1.005–1.030)
Squamous Epithelial / LPF: NONE SEEN
Urobilinogen, UA: 0.2 mg/dL (ref 0.0–1.0)
pH: 6.5 (ref 5.0–8.0)

## 2012-01-19 ENCOUNTER — Telehealth: Payer: Self-pay | Admitting: *Deleted

## 2012-01-19 MED ORDER — METRONIDAZOLE 500 MG PO TABS: 500.0000 mg | ORAL_TABLET | Freq: Two times a day (BID) | ORAL | Status: AC

## 2012-01-19 NOTE — Telephone Encounter (Signed)
pharmacy never received flagyl 500 mg 1 po x 5 days.

## 2012-01-26 LAB — HM PAP SMEAR: HM Pap smear: NORMAL

## 2012-01-27 ENCOUNTER — Encounter: Payer: Self-pay | Admitting: Physician Assistant

## 2012-01-27 DIAGNOSIS — K219 Gastro-esophageal reflux disease without esophagitis: Secondary | ICD-10-CM

## 2012-04-05 ENCOUNTER — Ambulatory Visit: Payer: BC Managed Care – PPO | Admitting: Family Medicine

## 2012-04-26 LAB — HM MAMMOGRAPHY: HM Mammogram: NORMAL

## 2012-04-27 ENCOUNTER — Other Ambulatory Visit: Payer: Self-pay | Admitting: Gynecology

## 2012-04-27 DIAGNOSIS — Z1231 Encounter for screening mammogram for malignant neoplasm of breast: Secondary | ICD-10-CM

## 2012-05-30 ENCOUNTER — Ambulatory Visit
Admission: RE | Admit: 2012-05-30 | Discharge: 2012-05-30 | Disposition: A | Discharge status: Home / Self Care | Payer: BC Managed Care – PPO | Source: Ambulatory Visit | Attending: Gynecology | Admitting: Gynecology

## 2012-05-30 DIAGNOSIS — Z1231 Encounter for screening mammogram for malignant neoplasm of breast: Secondary | ICD-10-CM

## 2012-12-07 ENCOUNTER — Encounter: Payer: Self-pay | Admitting: Internal Medicine

## 2012-12-07 ENCOUNTER — Ambulatory Visit (INDEPENDENT_AMBULATORY_CARE_PROVIDER_SITE_OTHER): Payer: BC Managed Care – PPO | Admitting: Internal Medicine

## 2012-12-07 ENCOUNTER — Ambulatory Visit (INDEPENDENT_AMBULATORY_CARE_PROVIDER_SITE_OTHER)
Admission: RE | Admit: 2012-12-07 | Discharge: 2012-12-07 | Disposition: A | Discharge status: Home / Self Care | Payer: BC Managed Care – PPO | Source: Ambulatory Visit | Attending: Internal Medicine | Admitting: Internal Medicine

## 2012-12-07 ENCOUNTER — Other Ambulatory Visit (INDEPENDENT_AMBULATORY_CARE_PROVIDER_SITE_OTHER): Payer: BC Managed Care – PPO

## 2012-12-07 VITALS — BP 104/70 | HR 55 | Temp 98.6°F | Resp 16 | Ht 62.0 in | Wt 140.0 lb

## 2012-12-07 DIAGNOSIS — D72819 Decreased white blood cell count, unspecified: Secondary | ICD-10-CM

## 2012-12-07 DIAGNOSIS — Z0000 Encounter for general adult medical examination without abnormal findings: Secondary | ICD-10-CM

## 2012-12-07 DIAGNOSIS — IMO0002 Reserved for concepts with insufficient information to code with codable children: Secondary | ICD-10-CM

## 2012-12-07 DIAGNOSIS — D509 Iron deficiency anemia, unspecified: Secondary | ICD-10-CM

## 2012-12-07 DIAGNOSIS — Z23 Encounter for immunization: Secondary | ICD-10-CM

## 2012-12-07 DIAGNOSIS — M5416 Radiculopathy, lumbar region: Secondary | ICD-10-CM

## 2012-12-07 LAB — CBC WITH DIFFERENTIAL/PLATELET
Basophils Absolute: 0 10*3/uL (ref 0.0–0.1)
Basophils Relative: 0.2 % (ref 0.0–3.0)
Eosinophils Absolute: 0 10*3/uL (ref 0.0–0.7)
Eosinophils Relative: 0.7 % (ref 0.0–5.0)
HCT: 36.1 % (ref 36.0–46.0)
Hemoglobin: 12.3 g/dL (ref 12.0–15.0)
Lymphocytes Relative: 20.8 % (ref 12.0–46.0)
Lymphs Abs: 1.1 10*3/uL (ref 0.7–4.0)
MCHC: 34.1 g/dL (ref 30.0–36.0)
MCV: 83.5 fl (ref 78.0–100.0)
Monocytes Absolute: 0.4 10*3/uL (ref 0.1–1.0)
Monocytes Relative: 7.5 % (ref 3.0–12.0)
Neutro Abs: 3.6 10*3/uL (ref 1.4–7.7)
Neutrophils Relative %: 70.8 % (ref 43.0–77.0)
Platelets: 253 10*3/uL (ref 150.0–400.0)
RBC: 4.32 Mil/uL (ref 3.87–5.11)
RDW: 13.1 % (ref 11.5–14.6)
WBC: 5.1 10*3/uL (ref 4.5–10.5)

## 2012-12-07 LAB — COMPREHENSIVE METABOLIC PANEL
ALT: 20 U/L (ref 0–35)
AST: 23 U/L (ref 0–37)
Albumin: 4.2 g/dL (ref 3.5–5.2)
Alkaline Phosphatase: 37 U/L — ABNORMAL LOW (ref 39–117)
BUN: 19 mg/dL (ref 6–23)
CO2: 27 mEq/L (ref 19–32)
Calcium: 9.3 mg/dL (ref 8.4–10.5)
Chloride: 105 mEq/L (ref 96–112)
Creatinine, Ser: 0.8 mg/dL (ref 0.4–1.2)
GFR: 85 mL/min (ref >60.00)
Glucose, Bld: 92 mg/dL (ref 70–99)
Potassium: 3.8 mEq/L (ref 3.5–5.1)
Sodium: 138 mEq/L (ref 135–145)
Total Bilirubin: 0.7 mg/dL (ref 0.3–1.2)
Total Protein: 6.7 g/dL (ref 6.0–8.3)

## 2012-12-07 LAB — LIPID PANEL
Cholesterol: 154 mg/dL (ref 0–200)
HDL: 75.6 mg/dL (ref >39.00)
LDL Cholesterol: 74 mg/dL (ref 0–99)
Total CHOL/HDL Ratio: 2
Triglycerides: 22 mg/dL (ref 0.0–149.0)
VLDL: 4.4 mg/dL (ref 0.0–40.0)

## 2012-12-07 LAB — FERRITIN: Ferritin: 52.8 ng/mL (ref 10.0–291.0)

## 2012-12-07 LAB — TSH: TSH: 0.8 u[IU]/mL (ref 0.35–5.50)

## 2012-12-07 LAB — IBC PANEL
Iron: 107 ug/dL (ref 42–145)
Saturation Ratios: 35.7 % (ref 20.0–50.0)
Transferrin: 213.9 mg/dL (ref 212.0–360.0)

## 2012-12-07 NOTE — Assessment & Plan Note (Signed)
I will check her plain film to see if she has an occult fracture, ddd, spurring, etc She will continue nsaids I have asked her to start PT

## 2012-12-07 NOTE — Assessment & Plan Note (Signed)
Exam done Vaccines were updated Labs ordered Pt ed material was given 

## 2012-12-07 NOTE — Patient Instructions (Signed)
Back Pain, Adult Low back pain is very common. About 1 in 5 people have back pain.The cause of low back pain is rarely dangerous. The pain often gets better over time.About half of people with a sudden onset of back pain feel better in just 2 weeks. About 8 in 10 people feel better by 6 weeks.  CAUSES Some common causes of back pain include:  Strain of the muscles or ligaments supporting the spine.  Wear and tear (degeneration) of the spinal discs.  Arthritis.  Direct injury to the back. DIAGNOSIS Most of the time, the direct cause of low back pain is not known.However, back pain can be treated effectively even when the exact cause of the pain is unknown.Answering your caregiver's questions about your overall health and symptoms is one of the most accurate ways to make sure the cause of your pain is not dangerous. If your caregiver needs more information, he or she may order lab work or imaging tests (X-rays or MRIs).However, even if imaging tests show changes in your back, this usually does not require surgery. HOME CARE INSTRUCTIONS For many people, back pain returns.Since low back pain is rarely dangerous, it is often a condition that people can learn to manageon their own.   Remain active. It is stressful on the back to sit or stand in one place. Do not sit, drive, or stand in one place for more than 30 minutes at a time. Take short walks on level surfaces as soon as pain allows.Try to increase the length of time you walk each day.  Do not stay in bed.Resting more than 1 or 2 days can delay your recovery.  Do not avoid exercise or work.Your body is made to move.It is not dangerous to be active, even though your back may hurt.Your back will likely heal faster if you return to being active before your pain is gone.  Pay attention to your body when you bend and lift. Many people have less discomfortwhen lifting if they bend their knees, keep the load close to their bodies,and  avoid twisting. Often, the most comfortable positions are those that put less stress on your recovering back.  Find a comfortable position to sleep. Use a firm mattress and lie on your side with your knees slightly bent. If you lie on your back, put a pillow under your knees.  Only take over-the-counter or prescription medicines as directed by your caregiver. Over-the-counter medicines to reduce pain and inflammation are often the most helpful.Your caregiver may prescribe muscle relaxant drugs.These medicines help dull your pain so you can more quickly return to your normal activities and healthy exercise.  Put ice on the injured area.  Put ice in a plastic bag.  Place a towel between your skin and the bag.  Leave the ice on for 15-20 minutes, 3-4 times a day for the first 2 to 3 days. After that, ice and heat may be alternated to reduce pain and spasms.  Ask your caregiver about trying back exercises and gentle massage. This may be of some benefit.  Avoid feeling anxious or stressed.Stress increases muscle tension and can worsen back pain.It is important to recognize when you are anxious or stressed and learn ways to manage it.Exercise is a great option. SEEK MEDICAL CARE IF:  You have pain that is not relieved with rest or medicine.  You have pain that does not improve in 1 week.  You have new symptoms.  You are generally not feeling well. SEEK   IMMEDIATE MEDICAL CARE IF:   You have pain that radiates from your back into your legs.  You develop new bowel or bladder control problems.  You have unusual weakness or numbness in your arms or legs.  You develop nausea or vomiting.  You develop abdominal pain.  You feel faint. Document Released: 06/28/2005 Document Revised: 12/28/2011 Document Reviewed: 11/16/2010 [facility] Patient Information 2014 [place], [place]. Preventive Care for Adults, Female A healthy lifestyle and preventive care can promote health and wellness.  Preventive health guidelines for women include the following key practices.  A routine yearly physical is a good way to check with your caregiver about your health and preventive screening. It is a chance to share any concerns and updates on your health, and to receive a thorough exam.  Visit your dentist for a routine exam and preventive care every 6 months. Brush your teeth twice a day and floss once a day. Good oral hygiene prevents tooth decay and gum disease.  The frequency of eye exams is based on your age, health, family medical history, use of contact lenses, and other factors. Follow your caregiver's recommendations for frequency of eye exams.  Eat a healthy diet. Foods like vegetables, fruits, whole grains, low-fat dairy products, and lean protein foods contain the nutrients you need without too many calories. Decrease your intake of foods high in solid fats, added sugars, and salt. Eat the right amount of calories for you.Get information about a proper diet from your caregiver, if necessary.  Regular physical exercise is one of the most important things you can do for your health. Most adults should get at least 150 minutes of moderate-intensity exercise (any activity that increases your heart rate and causes you to sweat) each week. In addition, most adults need muscle-strengthening exercises on 2 or more days a week.  Maintain a healthy weight. The body mass index (BMI) is a screening tool to identify possible weight problems. It provides an estimate of body fat based on height and weight. Your caregiver can help determine your BMI, and can help you achieve or maintain a healthy weight.For adults 20 years and older:  A BMI below 18.5 is considered underweight.  A BMI of 18.5 to 24.9 is normal.  A BMI of 25 to 29.9 is considered overweight.  A BMI of 30 and above is considered obese.  Maintain normal blood lipids and cholesterol levels by exercising and minimizing your intake of  saturated fat. Eat a balanced diet with plenty of fruit and vegetables. Blood tests for lipids and cholesterol should begin at age 57 and be repeated every 5 years. If your lipid or cholesterol levels are high, you are over 50, or you are at high risk for heart disease, you may need your cholesterol levels checked more frequently.Ongoing high lipid and cholesterol levels should be treated with medicines if diet and exercise are not effective.  If you smoke, find out from your caregiver how to quit. If you do not use tobacco, do not start.  If you are pregnant, do not drink alcohol. If you are breastfeeding, be very cautious about drinking alcohol. If you are not pregnant and choose to drink alcohol, do not exceed 1 drink per day. One drink is considered to be 12 ounces (355 mL) of beer, 5 ounces (148 mL) of wine, or 1.5 ounces (44 mL) of liquor.  Avoid use of street drugs. Do not share needles with anyone. Ask for help if you need support or instructions about stopping  the use of drugs.  High blood pressure causes heart disease and increases the risk of stroke. Your blood pressure should be checked at least every 1 to 2 years. Ongoing high blood pressure should be treated with medicines if weight loss and exercise are not effective.  If you are 43 to 42 years old, ask your caregiver if you should take aspirin to prevent strokes.  Diabetes screening involves taking a blood sample to check your fasting blood sugar level. This should be done once every 3 years, after age 1, if you are within normal weight and without risk factors for diabetes. Testing should be considered at a younger age or be carried out more frequently if you are overweight and have at least 1 risk factor for diabetes.  Breast cancer screening is essential preventive care for women. You should practice "breast self-awareness." This means understanding the normal appearance and feel of your breasts and may include breast  self-examination. Any changes detected, no matter how small, should be reported to a caregiver. Women in their 86s and 30s should have a clinical breast exam (CBE) by a caregiver as part of a regular health exam every 1 to 3 years. After age 70, women should have a CBE every year. Starting at age 69, women should consider having a mammography (breast X-ray test) every year. Women who have a family history of breast cancer should talk to their caregiver about genetic screening. Women at a high risk of breast cancer should talk to their caregivers about having magnetic resonance imaging (MRI) and a mammography every year.  The Pap test is a screening test for cervical cancer. A Pap test can show cell changes on the cervix that might become cervical cancer if left untreated. A Pap test is a procedure in which cells are obtained and examined from the lower end of the uterus (cervix).  Women should have a Pap test starting at age 52.  Between ages 35 and 73, Pap tests should be repeated every 2 years.  Beginning at age 37, you should have a Pap test every 3 years as long as the past 3 Pap tests have been normal.  Some women have medical problems that increase the chance of getting cervical cancer. Talk to your caregiver about these problems. It is especially important to talk to your caregiver if a new problem develops soon after your last Pap test. In these cases, your caregiver may recommend more frequent screening and Pap tests.  The above recommendations are the same for women who have or have not gotten the vaccine for human papillomavirus (HPV).  If you had a hysterectomy for a problem that was not cancer or a condition that could lead to cancer, then you no longer need Pap tests. Even if you no longer need a Pap test, a regular exam is a good idea to make sure no other problems are starting.  If you are between ages 30 and 49, and you have had normal Pap tests going back 10 years, you no longer  need Pap tests. Even if you no longer need a Pap test, a regular exam is a good idea to make sure no other problems are starting.  If you have had past treatment for cervical cancer or a condition that could lead to cancer, you need Pap tests and screening for cancer for at least 20 years after your treatment.  If Pap tests have been discontinued, risk factors (such as a new sexual partner) need to  be reassessed to determine if screening should be resumed.  The HPV test is an additional test that may be used for cervical cancer screening. The HPV test looks for the virus that can cause the cell changes on the cervix. The cells collected during the Pap test can be tested for HPV. The HPV test could be used to screen women aged 8 years and older, and should be used in women of any age who have unclear Pap test results. After the age of 50, women should have HPV testing at the same frequency as a Pap test.  Colorectal cancer can be detected and often prevented. Most routine colorectal cancer screening begins at the age of 57 and continues through age 97. However, your caregiver may recommend screening at an earlier age if you have risk factors for colon cancer. On a yearly basis, your caregiver may provide home test kits to check for hidden blood in the stool. Use of a small camera at the end of a tube, to directly examine the colon (sigmoidoscopy or colonoscopy), can detect the earliest forms of colorectal cancer. Talk to your caregiver about this at age 53, when routine screening begins. Direct examination of the colon should be repeated every 5 to 10 years through age 5, unless early forms of pre-cancerous polyps or small growths are found.  Hepatitis C blood testing is recommended for all people born from [birth year] through 1965 and any individual with known risks for hepatitis C.  Practice safe sex. Use condoms and avoid high-risk sexual practices to reduce the spread of sexually transmitted infections  (STIs). STIs include gonorrhea, chlamydia, syphilis, trichomonas, herpes, HPV, and human immunodeficiency virus (HIV). Herpes, HIV, and HPV are viral illnesses that have no cure. They can result in disability, cancer, and death. Sexually active women aged 5 and younger should be checked for chlamydia. Older women with new or multiple partners should also be tested for chlamydia. Testing for other STIs is recommended if you are sexually active and at increased risk.  Osteoporosis is a disease in which the bones lose minerals and strength with aging. This can result in serious bone fractures. The risk of osteoporosis can be identified using a bone density scan. Women ages 55 and over and women at risk for fractures or osteoporosis should discuss screening with their caregivers. Ask your caregiver whether you should take a calcium supplement or vitamin D to reduce the rate of osteoporosis.  Menopause can be associated with physical symptoms and risks. Hormone replacement therapy is available to decrease symptoms and risks. You should talk to your caregiver about whether hormone replacement therapy is right for you.  Use sunscreen with sun protection factor (SPF) of 30 or more. Apply sunscreen liberally and repeatedly throughout the day. You should seek shade when your shadow is shorter than you. Protect yourself by wearing long sleeves, pants, a wide-brimmed hat, and sunglasses year round, whenever you are outdoors.  Once a month, do a whole body skin exam, using a mirror to look at the skin on your back. Notify your caregiver of new moles, moles that have irregular borders, moles that are larger than a pencil eraser, or moles that have changed in shape or color.  Stay current with required immunizations.  Influenza. You need a dose every fall (or winter). The composition of the flu vaccine changes each year, so being vaccinated once is not enough.  Pneumococcal polysaccharide. You need 1 to 2 doses if  you smoke cigarettes or if you  have certain chronic medical conditions. You need 1 dose at age 41 (or older) if you have never been vaccinated.  Tetanus, diphtheria, pertussis (Tdap, Td). Get 1 dose of Tdap vaccine if you are younger than age 80, are over 48 and have contact with an infant, are a Research scientist (physical sciences), are pregnant, or simply want to be protected from whooping cough. After that, you need a Td booster dose every 10 years. Consult your caregiver if you have not had at least 3 tetanus and diphtheria-containing shots sometime in your life or have a deep or dirty wound.  HPV. You need this vaccine if you are a woman age 69 or younger. The vaccine is given in 3 doses over 6 months.  Measles, mumps, rubella (MMR). You need at least 1 dose of MMR if you were born in 1957 or later. You may also need a second dose.  Meningococcal. If you are age 52 to 28 and a first-year college student living in a residence hall, or have one of several medical conditions, you need to get vaccinated against meningococcal disease. You may also need additional booster doses.  Zoster (shingles). If you are age 14 or older, you should get this vaccine.  Varicella (chickenpox). If you have never had chickenpox or you were vaccinated but received only 1 dose, talk to your caregiver to find out if you need this vaccine.  Hepatitis A. You need this vaccine if you have a specific risk factor for hepatitis A virus infection or you simply wish to be protected from this disease. The vaccine is usually given as 2 doses, 6 to 18 months apart.  Hepatitis B. You need this vaccine if you have a specific risk factor for hepatitis B virus infection or you simply wish to be protected from this disease. The vaccine is given in 3 doses, usually over 6 months. Preventive Services / Frequency Ages 85 to 47  Blood pressure check.** / Every 1 to 2 years.  Lipid and cholesterol check.** / Every 5 years beginning at age 66.  Clinical  breast exam.** / Every 3 years for women in their 15s and 30s.  Pap test.** / Every 2 years from ages 64 through 88. Every 3 years starting at age 73 through age 55 or 83 with a history of 3 consecutive normal Pap tests.  HPV screening.** / Every 3 years from ages 58 through ages 38 to 72 with a history of 3 consecutive normal Pap tests.  Hepatitis C blood test.** / For any individual with known risks for hepatitis C.  Skin self-exam. / Monthly.  Influenza immunization.** / Every year.  Pneumococcal polysaccharide immunization.** / 1 to 2 doses if you smoke cigarettes or if you have certain chronic medical conditions.  Tetanus, diphtheria, pertussis (Tdap, Td) immunization. / A one-time dose of Tdap vaccine. After that, you need a Td booster dose every 10 years.  HPV immunization. / 3 doses over 6 months, if you are 69 and younger.  Measles, mumps, rubella (MMR) immunization. / You need at least 1 dose of MMR if you were born in 1957 or later. You may also need a second dose.  Meningococcal immunization. / 1 dose if you are age 39 to 67 and a first-year college student living in a residence hall, or have one of several medical conditions, you need to get vaccinated against meningococcal disease. You may also need additional booster doses.  Varicella immunization.** / Consult your caregiver.  Hepatitis A immunization.** / Consult  your caregiver. 2 doses, 6 to 18 months apart.  Hepatitis B immunization.** / Consult your caregiver. 3 doses usually over 6 months. Ages 40 to 75  Blood pressure check.** / Every 1 to 2 years.  Lipid and cholesterol check.** / Every 5 years beginning at age 63.  Clinical breast exam.** / Every year after age 8.  Mammogram.** / Every year beginning at age 58 and continuing for as long as you are in good health. Consult with your caregiver.  Pap test.** / Every 3 years starting at age 73 through age 29 or 59 with a history of 3 consecutive normal Pap  tests.  HPV screening.** / Every 3 years from ages 71 through ages 7 to 75 with a history of 3 consecutive normal Pap tests.  Fecal occult blood test (FOBT) of stool. / Every year beginning at age 43 and continuing until age 54. You may not need to do this test if you get a colonoscopy every 10 years.  Flexible sigmoidoscopy or colonoscopy.** / Every 5 years for a flexible sigmoidoscopy or every 10 years for a colonoscopy beginning at age 33 and continuing until age 49.  Hepatitis C blood test.** / For all people born from [birth year] through 1965 and any individual with known risks for hepatitis C.  Skin self-exam. / Monthly.  Influenza immunization.** / Every year.  Pneumococcal polysaccharide immunization.** / 1 to 2 doses if you smoke cigarettes or if you have certain chronic medical conditions.  Tetanus, diphtheria, pertussis (Tdap, Td) immunization.** / A one-time dose of Tdap vaccine. After that, you need a Td booster dose every 10 years.  Measles, mumps, rubella (MMR) immunization. / You need at least 1 dose of MMR if you were born in 1957 or later. You may also need a second dose.  Varicella immunization.** / Consult your caregiver.  Meningococcal immunization.** / Consult your caregiver.  Hepatitis A immunization.** / Consult your caregiver. 2 doses, 6 to 18 months apart.  Hepatitis B immunization.** / Consult your caregiver. 3 doses, usually over 6 months. Ages 38 and over  Blood pressure check.** / Every 1 to 2 years.  Lipid and cholesterol check.** / Every 5 years beginning at age 39.  Clinical breast exam.** / Every year after age 54.  Mammogram.** / Every year beginning at age 6 and continuing for as long as you are in good health. Consult with your caregiver.  Pap test.** / Every 3 years starting at age 16 through age 39 or 19 with a 3 consecutive normal Pap tests. Testing can be stopped between 65 and 70 with 3 consecutive normal Pap tests and no abnormal Pap or HPV  tests in the past 10 years.  HPV screening.** / Every 3 years from ages 54 through ages 75 or 9 with a history of 3 consecutive normal Pap tests. Testing can be stopped between 65 and 70 with 3 consecutive normal Pap tests and no abnormal Pap or HPV tests in the past 10 years.  Fecal occult blood test (FOBT) of stool. / Every year beginning at age 50 and continuing until age 50. You may not need to do this test if you get a colonoscopy every 10 years.  Flexible sigmoidoscopy or colonoscopy.** / Every 5 years for a flexible sigmoidoscopy or every 10 years for a colonoscopy beginning at age 78 and continuing until age 37.  Hepatitis C blood test.** / For all people born from [birth year] through 1965 and any individual with known risks for hepatitis  C.  Osteoporosis screening.** / A one-time screening for women ages 1 and over and women at risk for fractures or osteoporosis.  Skin self-exam. / Monthly.  Influenza immunization.** / Every year.  Pneumococcal polysaccharide immunization.** / 1 dose at age 34 (or older) if you have never been vaccinated.  Tetanus, diphtheria, pertussis (Tdap, Td) immunization. / A one-time dose of Tdap vaccine if you are over 65 and have contact with an infant, are a Research scientist (physical sciences), or simply want to be protected from whooping cough. After that, you need a Td booster dose every 10 years.  Varicella immunization.** / Consult your caregiver.  Meningococcal immunization.** / Consult your caregiver.  Hepatitis A immunization.** / Consult your caregiver. 2 doses, 6 to 18 months apart.  Hepatitis B immunization.** / Check with your caregiver. 3 doses, usually over 6 months. ** Family history and personal history of risk and conditions may change your caregiver's recommendations. Document Released: 08/24/2001 Document Revised: 09/20/2011 Document Reviewed: 11/23/2010 [facility] Patient Information 2014 [place], [place].

## 2012-12-07 NOTE — Progress Notes (Signed)
Subjective:    Patient ID: Christy Haley, female    DOB: June 05, 1971, 42 y.o.   MRN: [id number]  Anemia Presents for follow-up visit. Symptoms include malaise/fatigue. There has been no abdominal pain, anorexia, bruising/bleeding easily, confusion, fever, leg swelling, light-headedness, pallor, palpitations, paresthesias, pica or weight loss. Signs of blood loss that are not present include hematemesis, hematochezia, melena, menorrhagia and vaginal bleeding. Compliance problems include medication side effects.  Compliance with medications is 0-25%. Side effects of medications include GI discomfort.  Back Pain This is a recurrent problem. Episode onset: 3 months. The problem occurs intermittently. The problem has been gradually worsening since onset. The pain is present in the lumbar spine. Quality: "pinching" The pain radiates to the left thigh. The pain is at a severity of 3/10. The pain is mild. The pain is worse during the day. The symptoms are aggravated by position. Pertinent negatives include no abdominal pain, bladder incontinence, bowel incontinence, chest pain, dysuria, fever, headaches, leg pain, numbness, paresis, paresthesias, pelvic pain, perianal numbness, tingling, weakness or weight loss. Risk factors include recent trauma (low back injury 3 months ago). She has tried NSAIDs for the symptoms. The treatment provided mild relief.      Review of Systems  Constitutional: Positive for malaise/fatigue and fatigue. Negative for fever, chills, weight loss, diaphoresis, activity change, appetite change and unexpected weight change.  HENT: Negative.   Eyes: Negative.   Respiratory: Negative.  Negative for cough and shortness of breath.   Cardiovascular: Negative.  Negative for chest pain, palpitations and leg swelling.  Gastrointestinal: Negative.  Negative for nausea, vomiting, abdominal pain, diarrhea, constipation, blood in stool, melena, hematochezia, anorexia, hematemesis and bowel  incontinence.  Endocrine: Negative.   Genitourinary: Negative.  Negative for bladder incontinence, dysuria, hematuria, vaginal bleeding, enuresis, difficulty urinating, pelvic pain and menorrhagia.  Musculoskeletal: Positive for back pain.  Skin: Negative.  Negative for pallor.  Allergic/Immunologic: Negative.   Neurological: Negative.  Negative for dizziness, tingling, weakness, light-headedness, numbness, headaches and paresthesias.  Hematological: Negative.  Negative for adenopathy. Does not bruise/bleed easily.  Psychiatric/Behavioral: Positive for sleep disturbance. Negative for suicidal ideas, hallucinations, behavioral problems, confusion, self-injury, decreased concentration and agitation. The patient is nervous/anxious. The patient is not hyperactive.        Objective:   Physical Exam  Vitals reviewed. Constitutional: She is oriented to person, place, and time. She appears well-developed and well-nourished. No distress.  HENT:  Head: Normocephalic and atraumatic.  Mouth/Throat: Oropharynx is clear and moist. No oropharyngeal exudate.  Eyes: Conjunctivae are normal. Right eye exhibits no discharge. Left eye exhibits no discharge. No scleral icterus.  Neck: Normal range of motion. Neck supple. No JVD present. No tracheal deviation present. No thyromegaly present.  Cardiovascular: Normal rate, regular rhythm, normal heart sounds and intact distal pulses.  Exam reveals no gallop and no friction rub.   No murmur heard. Pulmonary/Chest: Effort normal and breath sounds normal. No stridor. No respiratory distress. She has no wheezes. She has no rales. She exhibits no tenderness.  Abdominal: Soft. Bowel sounds are normal. She exhibits no distension and no mass. There is no tenderness. There is no rebound and no guarding.  Musculoskeletal: Normal range of motion. She exhibits no edema and no tenderness.  Lymphadenopathy:    She has no cervical adenopathy.  Neurological: She is oriented to  person, place, and time.  Skin: Skin is warm and dry. No rash noted. She is not diaphoretic. No erythema. No pallor.  Psychiatric: Her speech is normal and  behavior is normal. Judgment and thought content normal. Her mood appears anxious. Her affect is not angry, not blunt, not labile and not inappropriate. Cognition and memory are normal. She does not exhibit a depressed mood.     Lab Results  Component Value Date   WBC 3.1* 01/14/2012   HGB 12.1 01/14/2012   HCT 36.6 01/14/2012   PLT 283 01/14/2012   GLUCOSE 87 01/14/2012   CHOL 139 01/14/2012   TRIG 41 01/14/2012   HDL 55 01/14/2012   LDLCALC 76 01/14/2012   ALT 12 01/14/2012   AST 15 01/14/2012   NA 139 01/14/2012   K 4.6 01/14/2012   CL 105 01/14/2012   CREATININE 0.68 01/14/2012   BUN 22 01/14/2012   CO2 28 01/14/2012   TSH 1.246 01/14/2012       Assessment & Plan:

## 2012-12-07 NOTE — Assessment & Plan Note (Signed)
I will recheck her CBC and her iron level today 

## 2012-12-15 ENCOUNTER — Telehealth: Payer: Self-pay | Admitting: Internal Medicine

## 2012-12-15 NOTE — Telephone Encounter (Signed)
Received 2 page Office Note from [facility] for Dr. Sanda Linger on 12/15/2012  asw  Sent up for Dr. Yetta Barre to review 12/15/2012 asw

## 2013-04-12 ENCOUNTER — Encounter: Payer: Self-pay | Admitting: Gynecology

## 2013-04-12 ENCOUNTER — Ambulatory Visit (INDEPENDENT_AMBULATORY_CARE_PROVIDER_SITE_OTHER): Payer: BC Managed Care – PPO | Admitting: Gynecology

## 2013-04-12 VITALS — BP 120/78 | Ht 62.0 in | Wt 135.4 lb

## 2013-04-12 DIAGNOSIS — L659 Nonscarring hair loss, unspecified: Secondary | ICD-10-CM | POA: Insufficient documentation

## 2013-04-12 DIAGNOSIS — N951 Menopausal and female climacteric states: Secondary | ICD-10-CM | POA: Insufficient documentation

## 2013-04-12 DIAGNOSIS — Z01419 Encounter for gynecological examination (general) (routine) without abnormal findings: Secondary | ICD-10-CM

## 2013-04-12 DIAGNOSIS — R29898 Other symptoms and signs involving the musculoskeletal system: Secondary | ICD-10-CM

## 2013-04-12 DIAGNOSIS — M6289 Other specified disorders of muscle: Secondary | ICD-10-CM

## 2013-04-12 DIAGNOSIS — R634 Abnormal weight loss: Secondary | ICD-10-CM | POA: Insufficient documentation

## 2013-04-12 DIAGNOSIS — Z23 Encounter for immunization: Secondary | ICD-10-CM

## 2013-04-12 LAB — HEMOGLOBIN A1C
Hgb A1c MFr Bld: 5.7 % — ABNORMAL HIGH (ref <5.7)
Mean Plasma Glucose: 117 mg/dL — ABNORMAL HIGH (ref <117)

## 2013-04-12 LAB — CBC WITH DIFFERENTIAL/PLATELET
Basophils Absolute: 0 10*3/uL (ref 0.0–0.1)
Basophils Relative: 0 % (ref 0–1)
Eosinophils Absolute: 0 10*3/uL (ref 0.0–0.7)
Eosinophils Relative: 1 % (ref 0–5)
HCT: 35.3 % — ABNORMAL LOW (ref 36.0–46.0)
Hemoglobin: 11.9 g/dL — ABNORMAL LOW (ref 12.0–15.0)
Lymphocytes Relative: 29 % (ref 12–46)
Lymphs Abs: 0.9 10*3/uL (ref 0.7–4.0)
MCH: 27.7 pg (ref 26.0–34.0)
MCHC: 33.7 g/dL (ref 30.0–36.0)
MCV: 82.3 fL (ref 78.0–100.0)
Monocytes Absolute: 0.3 10*3/uL (ref 0.1–1.0)
Monocytes Relative: 9 % (ref 3–12)
Neutro Abs: 1.8 10*3/uL (ref 1.7–7.7)
Neutrophils Relative %: 61 % (ref 43–77)
Platelets: 291 10*3/uL (ref 150–400)
RBC: 4.29 MIL/uL (ref 3.87–5.11)
RDW: 14.3 % (ref 11.5–15.5)
WBC: 3 10*3/uL — ABNORMAL LOW (ref 4.0–10.5)

## 2013-04-12 LAB — TSH: TSH: 1.274 u[IU]/mL (ref 0.350–4.500)

## 2013-04-12 NOTE — Progress Notes (Addendum)
Christy Haley [birth year] [id number]   History:    42 y.o.  for annual gyn exam with several complaints today. The patient stated the reason was she combs her hair she notices that she should some here at times. She also for the past several months has complained of times of night sweats. She denies any swings her ability. She does have a Marine IUD that was placing 2010 so she is not having a menstrual cycle. Patient would no prior history of abnormal Pap smears. Patient states that she has had history of anemia in the past. She also feels tiredness and fatigue at times. She is going to visit my colleague which is going to be her new PCP Dr.Paz next week. Patient's last mammogram was last November was normal with the exception of the breast were dense.  Patient with history of laparoscopic left ovarian cystectomy for benign ovarian cyst in 2007. The and a  Past medical history,surgical history, family history and social history were all reviewed and documented in the EPIC chart.  Gynecologic History No LMP recorded. Patient is not currently having periods (Reason: IUD). Contraception: IUD Last Pap: 2012. Results were: normal Last mammogram: see above. Results were: see above  Obstetric History OB History  Gravida Para Term Preterm AB SAB TAB Ectopic Multiple Living  3 2   1 1    2     # Outcome Date GA Lbr Len/2nd Weight Sex Delivery Anes PTL Lv  3 SAB           2 PAR     F SVD     1 PAR     F SVD          ROS: A ROS was performed and pertinent positives and negatives are included in the history.  GENERAL: No fevers or chills. HEENT: No change in vision, no earache, sore throat or sinus congestion. NECK: No pain or stiffness. CARDIOVASCULAR: No chest pain or pressure. No palpitations. PULMONARY: No shortness of breath, cough or wheeze. GASTROINTESTINAL: No abdominal pain, nausea, vomiting or diarrhea, melena or bright red blood per rectum. GENITOURINARY: No urinary frequency, urgency,  hesitancy or dysuria. MUSCULOSKELETAL: No joint or muscle pain, no back pain, no recent trauma. DERMATOLOGIC: No rash, no itching, no lesions. ENDOCRINE: No polyuria, polydipsia, no heat or cold intolerance. No recent change in weight. HEMATOLOGICAL: No anemia or easy bruising or bleeding. NEUROLOGIC: No headache, seizures, numbness, tingling or weakness. PSYCHIATRIC: No depression, no loss of interest in normal activity or change in sleep pattern.   complaining of tiredness, fatigue, hair loss and occasional night sweats  Exam: chaperone present  BP 120/78  Ht 5\' 2"  (1.575 m)  Wt 135 lb 6.4 oz (61.417 kg)  BMI 24.76 kg/m2  Body mass index is 24.76 kg/(m^2).  General appearance : Well developed well nourished female. No acute distress HEENT: Neck supple, trachea midline, no carotid bruits, no thyroidmegaly Lungs: Clear to auscultation, no rhonchi or wheezes, or rib retractions  Heart: Regular rate and rhythm, no murmurs or gallops Breast:Examined in sitting and supine position were symmetrical in appearance, no palpable masses or tenderness,  no skin retraction, no nipple inversion, no nipple discharge, no skin discoloration, no axillary or supraclavicular lymphadenopathy Abdomen: no palpable masses or tenderness, no rebound or guarding Extremities: no edema or skin discoloration or tenderness  Pelvic:  Bartholin, Urethra, Skene Glands: Within normal limits             Vagina: No gross lesions or discharge  Cervix: No gross lesions or discharge, IUD string seen  Uterus  anteverted, normal size, shape and consistency, non-tender and mobile  Adnexa  Without masses or tenderness  Anus and perineum  normal   Rectovaginal  normal sphincter tone without palpated masses or tenderness             Hemoccult none indicated     Assessment/Plan:  42 y.o. female for annual exam with vague symptoms of tiredness, fatigue, occasional night sweats and alopecia. Patient today is nonfasting. We will  rule out the possibility of hypothyroidism by taking her to states today. Patient states her mother recent menopausal in her mid 12s. We'll get a baseline FSH in the event that she may be perimenopausal early. Literature and information on the perimenopause was provided in [place]. We will check her CBC to rule out anemia. We'll check a vitamin D level because of her muscle tiredness and fatigue. We'll also check a hemoglobin A1c. She was reminded to go next week to see her primary care physician the fasting state that he may do a full lipid profile at that time and he will have access to these office note as well. Patient received the flu vaccine today. She was reminded to schedule her mammogram for next month and to request a 3-D because review of her last mammogram in 2013 demonstrated that her breasts were heterogeneously dense and had a call back for additional views which were normal.    Ok Edwards MD, 8:59 AM 04/12/2013

## 2013-04-12 NOTE — Patient Instructions (Addendum)
Vacuna antigripal (vacuna antigripal inactivada) 2013 2014, Lo que debe saber  (Influenza Vaccine [Flu Vaccine, Inactivated] 2013 2014, What You Need to Know) PORQU VACUNARSE?   La influenza ("gripe") es una enfermedad contagiosa que se propaga por los [place] en invierno, por lo general entre octubre y [place].  La causa de la gripe es el virus de la influenza, y se puede contagiar por la tos, al estornudar y por el contacto cercano.  Cualquier persona puede Writer gripe, Biomedical engineer el riesgo es mayor entre los nios. Los sntomas aparecen rpidamente y pueden durar [address]. Pueden ser:  Grant Ruts o escalofros.  Dolor de Advertising copywriter.  Dolores musculares.  La fatiga.  Tos.  Dolor de [place].  Secrecin o congestin nasal. La gripe puede hacer que algunas personas se enfermen ms que otros. Entre [employer] se incluyen a los nios pequeos, las Smith International de 65 aos, las mujeres embarazadas y las personas con Runner, broadcasting/film/video, como enfermedades cardacas, pulmonares o renales, o que tienen un sistema inmunolgico debilitado. La vacuna contra la gripe es especialmente importante para estas personas y para todos los que estn en estrecho contacto con ellos.  La gripe tambin puede causar neumona y Theme park manager las afecciones existentes. En los nios, puede provocar diarrea y convulsiones.  Cada ao miles de [employer] [place] debido a la gripe y muchos ms deben ser hospitalizados.  La vacuna contra la gripe es la mejor proteccin que existe contra la gripe y sus complicaciones. La vacuna contra la gripe tambin ayuda a prevenir la propagacin de la gripe de Neomia Dear persona a Educational psychologist.  VACUNA INACTIVADA CONTRA LA GRIPE  Hay dos tipos de vacunas contra la gripe:   Usted recibir la vacuna de la gripe inactivada, que no contiene virus vivo. Se administra en forma de inyeccin con Marella Bile y se llama la "vacuna antigripal".  Otro tipo de vacuna con virus vivos,  atenuados (debilitados), se aplica en forma de aerosol en las fosas nasales. Esta vacuna se describe en el apartado Informacin sobre las vacunas. Se recomienda aplicarse la vacuna contra la gripe todos los [place]. Los nios [employer] 6 meses y los 8 aos de edad deben recibir 2 dosis Dispensing optician que se vacunen.  Los virus de la gripe [place] constantemente. Cada ao, la vacuna contra la gripe se actualiza para proteger contra los virus que tienen ms probabilidades de causar la enfermedad ese ao. Aunque la vacuna no puede prevenir todos los casos de gripe, es nuestra mejor defensa contra la enfermedad. Vacuna contra la gripe inactivada protege contra 3 o 4 virus diferentes.  Se tarda aproximadamente 2 semanas para desarrollar la proteccin despus de la vacunacin y la proteccin dura entre algunos meses y un ao.  Muchas veces se confunden con la gripe algunas enfermedades que no son causadas por el virus de la gripe. La vacuna contra la gripe no previene estas enfermedades. Slo se puede prevenir la gripe.  Para las personas de ms de 65 aos, se dispone de una vacuna contra la gripe de "dosis elevada". La persona que aplica la vacuna puede darle ms informacin al respecto.  Algunas de las vacunas contra la gripe inactivada contienen una cantidad muy pequea de un conservante a base de mercurio llamado timerosal. Algunos estudios han demostrado que el timerosal en las vacunas no es perjudicial, pero se dispone de vacunas contra la gripe que no contienen el conservante.  ALGUNAS PERSONAS NO DEBEN RECIBIR ESTA VACUNA Informe a la  persona que le aplica la vacuna:   Si sufre alguna alergia grave (que pone en peligro la vida). Si alguna vez tuvo una reaccin alrgica potencialmente mortal despus de Neomia Dear dosis de la vacuna contra la gripe, o tuvo una alergia grave a cualquiera de los componentes de [place], es posible que se le recomiende no recibir una dosis. La [employer]son de las vacunas contra la gripe,  aunque no todas, contienen una pequea cantidad de [place].  Si alguna vez ha sufrido el sndrome de Pension scheme manager (una enfermedad paralizante grave tambin llamada GBS). Algunas personas con antecedentes de GBS no deben recibir esta vacuna. Debe comentarlo con su mdico.  Si no se siente bien. Podran sugerirle que espere hasta sentirse mejor. Pero debe volver. RIESGOS DE UNA REACCIN A LA VACUNA Con la vacuna, como cualquier medicamento, existe la posibilidad de sufrir efectos secundarios. Suelen ser leves y desaparecen por s solos.  Los efectos secundarios graves son [place], pero son Lynnae Sandhoff raros. Vacuna de la gripe inactivada no contiene el virus vivo de la gripe, la gripe por lo tanto enfermarse por recibir la vacuna no es posible.  Episodios de desmayo leves y sntomas relacionados (tales como sacudidas) pueden presentarse despus de cualquier procedimiento mdico, incluyendo la vacunacin. Si permanece sentado o recostado durante 15 minutos despus de la vacunacin puede ayudar a [employer] Corporation y las lesiones causadas por las cadas. Informe al mdico si se siente mareado o aturdido, tiene Allied Waste Industries visin o zumbidos en los odos.  Problemas leves luego de recibir la vacuna de la gripe inactivada:   Barista, enrojecimiento o Paramedic en el que le aplicaron la vacuna.  Ronquera; dolor, inflamacin o picazn en los ojos o tos.  Grant Ruts.  Dolores.  Dolor de [place].  Picazn.  Fatiga. Si estos problemas ocurren, en general comienzan poco despus de vacunarse y duran 1  2 das.  Problemas moderados luego de recibir la vacuna de la gripe inactivada:   Los nios que reciben la vacuna contra la gripe inactivada y Research scientist (medical) antineumoccica (PCV13) al mismo tiempo, pueden tener un mayor riesgo de sufrir convulsiones causadas por fiebre. Consulte a su mdico para obtener ms informacin. Informe a su mdico si un nio que est recibiendo la vacuna contra  la gripe ha tenido una convulsin. Problemas graves luego de recibir la vacuna inactivada contra la gripe:   Neomia Dear reaccin alrgica grave puede ocurrir despus de la administracin de cualquier vacuna (se estima en menos de 1 en un milln de dosis).  Hay una pequea posibilidad de que la vacuna de la gripe inactivada est asociada con el sndrome de Guillain-Barr (GBS), no ms de 1 o 2 casos por milln de personas vacunadas. Es Chief Operating Officer que el riesgo de sufrir complicaciones graves por la gripe, que puede prevenirse con la vacunacin. Se controla permanentemente la seguridad de las vacunas. Para obtener ms informacin, consulte [email or web address]/  QU PASA SI HAY UNA REACCIN GRAVE?  Qu signos debo buscar?   Observe todo lo que le preocupe, como signos de una reaccin alrgica grave, fiebre muy alta o cambios en el comportamiento. Los signos de Runner, broadcasting/film/video grave pueden incluir urticaria, hinchazn de la cara y la garganta, dificultad para respirar, ritmo cardaco acelerado, mareos y debilidad. Pueden comenzar entre unos pocos minutos y algunas horas despus de la vacunacin.  Qu debo hacer?   Si usted piensa que se trata de una reaccin alrgica grave o de otra emergencia  que no puede esperar, llame al 911 o lleve a la persona al hospital ms cercano. De lo contrario, llame a su mdico.  Despus, la reaccin debe informarse a la "Vaccine Adverse Event Reporting System" (Sistema de informacin sobre efectos adversos de las vacunas -VAERS). Su mdico puede presentar este informe, o puede hacerlo usted mismo a travs del sitio web de VAERS, en www.vaers.LAgents.no, o llamando al 253-010-8719. VAERS es slo para informar reacciones. No brindan consejo mdico.  PROGRAMA NACIONAL DE COMPENSACIN DE DAOS POR VACUNAS  El National Vaccine Injury Compensation Program (VICP) es un programa federal que fue creado para compensar a las personas que puedan haber sufrido daos al  recibir ciertas vacunas.  Aquellas personas que consideren que han sufrido un dao como consecuencia de una vacuna y quieren saber ms acerca del programa y como presentar Roslynn Amble, [place] llamar al [phone] o visitar su sitio web en SpiritualWord.at.  CMO PUEDO OBTENER MS INFORMACIN?   Consulte a su mdico.  Comunquese con el servicio de salud de su localidad o [address].  Comunquese con los Centros para el control y la prevencin de Child psychotherapist for Disease Control and Prevention , CDC).  Llame al 219-193-3973 (1-800-CDC-INFO) o  Visite la pgina web de los CDC en BiotechRoom.com.cy. CDC Inactivated Influenza Vaccine Interim VIS (02/04/12)  Document Released: 09/24/2008 Document Revised: 03/22/2012 ExitCare Patient Information 2014 [place], [place]. Perimenopausia (Perimenopause) La perimenopausia es el perodo en el que el organismo se prepara para la menopausia (ausencia de perodo menstrual durante 12 meses). Es un proceso natural. Puede comenzar entre 2 y 8 aos antes y Engineer, technical sales 1 ao despus de la menopausia. Durante este perodo los ovarios pueden o no producir vulos. Cada mes, los ovarios varan en la produccin de estrgenos y Education officer, museum. Esto causa perodos menstruales irregulares, dificultad para quedar embarazada, hemorragias vaginales entre perodos y sntomas molestos. CAUSAS  Produccin irregular de hormonas ovricas, estrgenos y Education officer, museum, y falta de ovulacin.  Entre otras causas se incluyen:  Tumor en la glndula pituitaria, ubicada en el cerebro.  Enfermedades que A[employer]ren Corporation ovarios.  Tratamientos de radioterapia.  Quimioterapia.  Causas desconocidas.  El fumar y consumir alcohol en exceso puede ocasionar una menopausia prematura. SNTOMAS  Sofocos.  Sudoracin nocturna.  Perodos menstruales irregulares.  Disminucin del impulso sexual.  Sequedad vaginal.  Cefaleas.  Trastornos del  [place] de nimo.  Depresin.  Problemas de memoria.  Irritabilidad.  Cansancio.  Aumento de [place].  Dificultad para quedar embarazada.  Comienza a perderse la masa sea (osteoporosis).  Comienzan a endurecerse las arterias (aterosclerosis). DIAGNSTICO El medico har el diagnstico basndose en su edad, la historia de los ciclos menstruales y sus sntomas. Realizar un examen fsico para notar cualquier cambio en el cuerpo, especialmente en los rganos femeninos. Los [employer] Corporation de las hormonas femeninas pueden o no ser de [place], segn la cantidad y Research officer, trade union en que se producen. Sin embargo, otros anlisis de hormonas pueden ser de [place] (por ejemplo el de hormona tiroidea) para Armed forces logistics/support/administrative officer. TRATAMIENTO La decisin de recibir tratamiento durante la perimenopausia debe tomarse en conjunto entre usted y su mdico, segn el grado en que los sntomas la afecten a usted y a su estilo de vida. Existen varios tipos de tratamiento disponibles como:  Tratamiento de los sntomas individuales con un medicamento especfico para ese sntoma (por ejemplo un tranquilizante para la depresin).  Hierbas que pueden ayudar en algunos sntomas especficos.  Psicoterapia.  Terapia grupal.  No recibir tratamiento. INSTRUCCIONES  PARA EL CUIDADO DOMICILIARIO  Antes de Science writer con su mdico haga una lista con los [place] de sus perodos menstruales (cuando ocurrieron, cun abundantes son, cuanto tiempo pasa entre perodos y cunto duran), con sus sntomas y Airline pilot en que comenzaron.  Tome los otros medicamentos que le indic el profesional que lo asiste.  Duerma y descanse.  Practique ejercicios.  Consuma una dieta rica en calcio (buena para los [place]) y soja (acta como un estrgeno).  No fume.  Evite las bebidas alcohlicas.  El consumo de vitamina E puede ayudar en ciertos casos.  Tome suplementos de calcio y vitamina D para evitar la prdida de masa sea.  En algunos  casos es de gran ayuda la terapia grupal.  En algunos casos puede ser de utilidad la acupuntura. SOLICITE ANTENCIN MDICA SI:  Tiene alguno de los sntomas mencionados y quiere saber si est en la perimenopausia.  Quiere consejo y tratamiento para alguno de los sntomas mencionados.  Necesita ser derivada a un especialista (gineclogo, psiquiatra o psiclogo). SOLICITE ATENCIN MDICA DE INMEDIATO SI:  Presenta una hemorragia vaginal abundante.  Presenta el perodo dura ms de 8 das.  Presenta los perodos aparecen en intervalos menores a 21 das.  Presenta tiene una hemorragia despus de Sales promotion account executive.  Presenta sufre una depresin severa.  Presenta siente dolor al [employer].  Presenta tiene dolores de [place] intensos.  Presenta desarrolla trastornos visuales. Document Released: 06/28/2005 Document Revised: 09/20/2011 [facility] Patient Information 2014 [place], [place].

## 2013-04-12 NOTE — Addendum Note (Signed)
Addended by: Bertram Savin A on: 04/12/2013 09:37 AM   Modules accepted: Orders

## 2013-04-13 ENCOUNTER — Encounter: Payer: Self-pay | Admitting: Gynecology

## 2013-04-13 ENCOUNTER — Other Ambulatory Visit: Payer: Self-pay | Admitting: Gynecology

## 2013-04-13 DIAGNOSIS — D649 Anemia, unspecified: Secondary | ICD-10-CM

## 2013-04-13 DIAGNOSIS — R7309 Other abnormal glucose: Secondary | ICD-10-CM

## 2013-04-13 LAB — VITAMIN D 25 HYDROXY (VIT D DEFICIENCY, FRACTURES): Vit D, 25-Hydroxy: 45 ng/mL (ref 30–89)

## 2013-04-13 LAB — FOLLICLE STIMULATING HORMONE: FSH: 13.6 m[IU]/mL

## 2013-04-23 ENCOUNTER — Telehealth: Payer: Self-pay

## 2013-04-23 NOTE — Telephone Encounter (Signed)
No answer/no voice mail  See Dr Lily Peer for gyn [facility] 05/2012 PAP 01/2012 Flu vaccine UTD

## 2013-04-24 ENCOUNTER — Encounter: Payer: Self-pay | Admitting: Internal Medicine

## 2013-04-24 ENCOUNTER — Ambulatory Visit (INDEPENDENT_AMBULATORY_CARE_PROVIDER_SITE_OTHER): Payer: BC Managed Care – PPO | Admitting: Internal Medicine

## 2013-04-24 VITALS — BP 106/68 | HR 83 | Temp 98.3°F | Wt 134.0 lb

## 2013-04-24 DIAGNOSIS — R7309 Other abnormal glucose: Secondary | ICD-10-CM

## 2013-04-24 DIAGNOSIS — D72819 Decreased white blood cell count, unspecified: Secondary | ICD-10-CM

## 2013-04-24 DIAGNOSIS — D509 Iron deficiency anemia, unspecified: Secondary | ICD-10-CM

## 2013-04-24 DIAGNOSIS — R739 Hyperglycemia, unspecified: Secondary | ICD-10-CM

## 2013-04-24 DIAGNOSIS — K219 Gastro-esophageal reflux disease without esophagitis: Secondary | ICD-10-CM

## 2013-04-24 MED ORDER — ESOMEPRAZOLE MAGNESIUM 40 MG PO CPDR: 40.0000 mg | DELAYED_RELEASE_CAPSULE | Freq: Every day | ORAL | Status: DC

## 2013-04-24 NOTE — Progress Notes (Signed)
  Subjective:    Patient ID: Christy Haley, female    DOB: 07/04/1971, 42 y.o.   MRN: [id number]  HPI New patient to me. Patient is concerned about all that labs drawn throughout the year and would like to review them w/ me. (was told she had leukopenia, suppressed TSH, anemia and needed to take  iron) Also has a long history of GERD, used to see Dr. Loreta Ave, was taking Nexium 40 mg with very good results; she is somehow concerned because she never had EGD although Dr Loreta Ave told pt that was not really necessary. Lately, she tried OTC Nexium which is only 20 mg and did not do as well as the 40 mg tablet.  Past Medical History  Diagnosis Date  . GERD (gastroesophageal reflux disease)    Past Surgical History  Procedure Laterality Date  . Pelvic laparoscopy      ovarian cystectomy  . Intrauterine device insertion  2010    Mirena   History   Social History  . Marital Status: Married    Spouse Name: N/A    Number of Children: 2  . Years of Education: N/A   Occupational History  . Arts administrator     Social History Main Topics  . Smoking status: Never Smoker   . Smokeless tobacco: Never Used  . Alcohol Use: No  . Drug Use: No  . Sexual Activity: Yes    Birth Control/ Protection: IUD     Comment: Mirena IUD 03/31/2009   Other Topics Concern  . Not on file   Social History Narrative   Original from [place]   Family History  Problem Relation Age of Onset  . Diabetes Maternal Grandmother   . Arthritis Mother   . Hypertension Mother   . Heart disease Neg Hx   . Hyperlipidemia Neg Hx   . Kidney disease Neg Hx   . Stroke Neg Hx   . Alcohol abuse Neg Hx   . Cancer Neg Hx   . COPD Neg Hx   . Early death Neg Hx     Review of Systems Denies dysphasia or odynophagia No nausea, vomiting, diarrhea blood in the stools Patient has no periods, has an IUD.    Objective:   Physical Exam BP 106/68  Pulse 83  Temp(Src) 98.3 F (36.8 C)  Wt 134 lb (60.782 kg)  BMI 24.5 kg/m2   SpO2 98% General -- alert, well-developed, NAD.  Neck --no thyromegaly  Lungs -- normal respiratory effort, no intercostal retractions, no accessory muscle use, and normal breath sounds.  Heart-- normal rate, regular rhythm, no murmur.  Abdomen-- Not distended, good bowel sounds,soft, non-tender.  Extremities-- no pretibial edema bilaterally  Neurologic--  alert & oriented X3. Speech normal, gait normal, strength normal in all extremities.  Psych-- Cognition and judgment appear intact. Cooperative with normal attention span and concentration. No anxious appearing , no depressed appearing.       Assessment & Plan:   All labs available in the computer review with her.Today , I spent more than 30 min with the patient, >50% of the time counseling about  all her lab results, multiple questions answered to the best of my ability

## 2013-04-24 NOTE — Patient Instructions (Signed)
Next visit in 6 to 8 months  for a check up

## 2013-04-24 NOTE — Assessment & Plan Note (Addendum)
  Hemoglobin is a slightly low, iron is normal, she does not have iron deficiency anemia, I don't recommend iron supplementation.

## 2013-04-24 NOTE — Assessment & Plan Note (Signed)
Recent A1c 5.7,  the patient's daughter has prediabetes and she knows very well how to eat healthy (went to a class). Plan:  Exercise Eat healthy Retake an A1c when she comes back

## 2013-04-24 NOTE — Assessment & Plan Note (Signed)
Symptoms we'll control as long as she takes Nexium, no red flag symptoms, I don't think she needs EGD at this point.  Plan: refill Nexium, follow up in 6-8 months, will call if she has dysphagia or increased symptoms

## 2013-04-24 NOTE — Assessment & Plan Note (Signed)
Has mild leukopenia,  stable, I recommend to check a CBC yearly.

## 2013-05-08 ENCOUNTER — Ambulatory Visit: Payer: BC Managed Care – PPO

## 2013-05-08 ENCOUNTER — Ambulatory Visit (INDEPENDENT_AMBULATORY_CARE_PROVIDER_SITE_OTHER): Payer: BC Managed Care – PPO | Admitting: Family Medicine

## 2013-05-08 VITALS — BP 100/60 | HR 54 | Temp 98.5°F | Resp 18 | Ht 62.0 in | Wt 133.0 lb

## 2013-05-08 DIAGNOSIS — M25572 Pain in left ankle and joints of left foot: Secondary | ICD-10-CM

## 2013-05-08 DIAGNOSIS — M25579 Pain in unspecified ankle and joints of unspecified foot: Secondary | ICD-10-CM

## 2013-05-08 DIAGNOSIS — S93409A Sprain of unspecified ligament of unspecified ankle, initial encounter: Secondary | ICD-10-CM

## 2013-05-08 MED ORDER — MELOXICAM 7.5 MG PO TABS: 7.5000 mg | ORAL_TABLET | Freq: Every day | ORAL | Status: DC

## 2013-05-08 MED ORDER — TRAMADOL HCL 50 MG PO TABS: 50.0000 mg | ORAL_TABLET | Freq: Three times a day (TID) | ORAL | Status: DC | PRN

## 2013-05-08 NOTE — Progress Notes (Signed)
[facility] [address] [place] [address] [phone]  Date:  05/08/2013   Name:  Alverta Caccamo   DOB:  1971-01-16   MRN:  [id number]  PCP:  Willow Ora, MD    Chief Complaint: Ankle Injury   History of Present Illness:  Chanley Mcenery is a 42 y.o. very pleasant female patient who presents with the following:  Last night she was jogging outdoors and twisted her left ankle. She inverted the ankle when she stpeed on something uneven.  She did fall down and also sustained a few scrapes on her hands.  However nothing hurts now except for her ankle.  She is able to walk but she has pain and is moving slowly.  She thinks she has sprained her ankle  IUD, no chance of pregnancy   Patient Active Problem List   Diagnosis Date Noted  . Hyperglycemia 04/24/2013  . Muscle tiredness 04/12/2013  . Alopecia 04/12/2013  . Loss of weight 04/12/2013  . Hot flushes, perimenopausal 04/12/2013  . Iron deficiency anemia 12/07/2012  . Left lumbar radiculitis 12/07/2012  . Routine general medical examination at a health care facility 12/07/2012  . Leukopenia 01/14/2012  . GERD 01/06/2012    Past Medical History  Diagnosis Date  . GERD (gastroesophageal reflux disease)     Past Surgical History  Procedure Laterality Date  . Pelvic laparoscopy      ovarian cystectomy  . Intrauterine device insertion  2010    Mirena    History  Substance Use Topics  . Smoking status: Never Smoker   . Smokeless tobacco: Never Used  . Alcohol Use: No    Family History  Problem Relation Age of Onset  . Diabetes Maternal Grandmother   . Arthritis Mother   . Hypertension Mother   . Heart disease Neg Hx   . Hyperlipidemia Neg Hx   . Kidney disease Neg Hx   . Stroke Neg Hx   . Alcohol abuse Neg Hx   . Cancer Neg Hx   . COPD Neg Hx   . Early death Neg Hx     No Known Allergies  Medication list has been reviewed and updated.  Current Outpatient  Prescriptions on File Prior to Visit  Medication Sig Dispense Refill  . esomeprazole (NEXIUM) 40 MG capsule Take 1 capsule (40 mg total) by mouth daily before breakfast.  90 capsule  2  . levonorgestrel (MIRENA) 20 MCG/24HR IUD 1 each by Intrauterine route once.        . Multiple Vitamin (MULTIVITAMIN) tablet Take 1 tablet by mouth daily.         No current facility-administered medications on file prior to visit.    Review of Systems:  As per HPI- otherwise negative.   Physical Examination: Filed Vitals:   05/08/13 0902  BP: 100/60  Pulse: 54  Temp: 98.5 F (36.9 C)  Resp: 18   Filed Vitals:   05/08/13 0902  Height: 5\' 2"  (1.575 m)  Weight: 133 lb (60.328 kg)   Body mass index is 24.32 kg/(m^2). Ideal Body Weight: Weight in (lb) to have BMI = 25: 136.4  GEN: WDWN, NAD, Non-toxic, A & O x 3, looks well HEENT: Atraumatic, Normocephalic. Neck supple. No masses, No LAD. Ears and Nose: No external deformity. CV: RRR, No M/G/R. No JVD. No thrill. No extra heart sounds. PULM: CTA B, no wheezes, crackles, rhonchi. No retractions. No resp. distress. No accessory muscle use. ABD: S, NT, ND, +BS.  No rebound. No HSM. EXTR: No c/c/e NEURO slow gait, favoring left ankle PSYCH: Normally interactive. Conversant. Not depressed or anxious appearing.  Calm demeanor.  Left ankle: tender and slightly swollen over the lateral malleolus.  Foot is normal, achilles intact.  Knee is normal.  No bruise, wound, or redness  [facility] reading (PRIMARY) by  Dr. Patsy Lager. Left ankle: tiny avulsion lateral ankle.  Otherwise negative FINDINGS: There is mild soft tissue swelling involving the lateral aspect of the ankle. Joint spaces are preserved. No disruption of mortise is evident. No fracture of tibia or fibula is seen. There is a tiny calcific density seen adjacent to the lateral aspect of the talus. This could be small accessory ossicle but I cannot exclude a small chip avulsion fracture. No evidence  of fracture seen. No dislocation is evident.  IMPRESSION: Mild soft tissue swelling involving lateral aspect of the ankle. There is a tiny calcific density seen adjacent to the lateral aspect of the talus. This could be small accessory ossicle but I cannot exclude a small chip avulsion fracture. Suggest conservative management for small chip avulsion fracture.  Assessment and Plan: Sprain of ankle, unspecified site - Plan: DG Ankle Complete Left, meloxicam (MOBIC) 7.5 MG tablet, traMADol (ULTRAM) 50 MG tablet  Pain in left ankle  Left ankle sprain and possible small avulsion fracture.  Placed in a tall CAM boot, given crutches and rx for mobic and tramadol.  Cautioned regarding sedation with tramadol.   Plan recheck in one week.   See patient instructions for more details.     Signed Abbe Amsterdam, MD

## 2013-05-08 NOTE — Patient Instructions (Addendum)
Use crutches as needed for support.  Ice and elevate your leg as needed.  If you are not feeling better in the next week or so please let me know.  Wear the boot when you are up and around   Use the mobic as needed for swelling and pain.  You may also use the tramadol for pain- however remember this can cause drowsiness so do not use it when you need to drive.    Please come and see [place] in about one week for a recheck

## 2013-05-11 ENCOUNTER — Other Ambulatory Visit: Payer: Self-pay

## 2013-05-11 DIAGNOSIS — Z1231 Encounter for screening mammogram for malignant neoplasm of breast: Secondary | ICD-10-CM

## 2013-05-15 ENCOUNTER — Ambulatory Visit: Payer: BC Managed Care – PPO

## 2013-05-15 ENCOUNTER — Ambulatory Visit (INDEPENDENT_AMBULATORY_CARE_PROVIDER_SITE_OTHER): Payer: BC Managed Care – PPO | Admitting: Family Medicine

## 2013-05-15 VITALS — BP 114/60 | HR 54 | Temp 98.1°F | Resp 18

## 2013-05-15 DIAGNOSIS — S99912D Unspecified injury of left ankle, subsequent encounter: Secondary | ICD-10-CM

## 2013-05-15 DIAGNOSIS — Z5189 Encounter for other specified aftercare: Secondary | ICD-10-CM

## 2013-05-15 DIAGNOSIS — S93402D Sprain of unspecified ligament of left ankle, subsequent encounter: Secondary | ICD-10-CM

## 2013-05-15 DIAGNOSIS — S93409A Sprain of unspecified ligament of unspecified ankle, initial encounter: Secondary | ICD-10-CM

## 2013-05-15 NOTE — Progress Notes (Signed)
Reviewed documentation and xray.  Radiology overread states small acute versus chronic avulsion fracture fragment suspected lateral to the talus near the lateral malleolus.  UTD states that "Small chip fractures (Type I) of the lateral process of the talus that are non-displaced can be treated by non-surgical clinicians experienced in the management of lower extremity fractures. These fractures are treated with a short-leg cast and non-weightbearing or limited weightbearing for four weeks, followed by a weight-bearing cast for two weeks. Thereafter, patients can be weaned from the cast boot as their symptoms improve. Once the patient is pain-free, the patient should begin an appropriate rehabilitation program. If pain persists, a CT scan should be obtained to assess the fracture for possible nonunion. If the CT demonstrates healing, the patient continues with weight bearing as tolerated in the cast boot. If signs of healing are not evident or pain persists at three months, the patient should be referred to an appropriate surgeon."  Cons switching pt from tall to short CAM boot but continue limited weightbearing x 3 additional wks. Recheck xray in 3 wks and then restart weight-bearing in short CAM x 2 additional weeks. Norberto Sorenson, MD MPH

## 2013-05-15 NOTE — Progress Notes (Signed)
Subjective:    Patient ID: Christy Haley, female    DOB: 10/15/1970, 42 y.o.   MRN: [id number]  HPI   Christy Haley is a very pleasant 42 yr old female here for follow up on a left ankle injury that occurred 1 wk ago.  At that time xrays indicated possible tiny chip fracture of the talus vs accessory ossicle.  Christy Haley has been in a tall cam walker all week.  She reports for the first 3 days she basically just elevated the ankle and it felt better.  For the last 3-4 days she's been trying to bear more weight but still has pain - primarily in achilles area.  Feels like she has improved, but not has much as she had hoped.  Admits, though, that she is nervous to bear weight and may be able to do more than she thinks she can.  She currently has no swelling or bruising of the ankle.  She is using meloxicam and tramadol prn pain.  Admits that the cam walker is causing her to alter her gait which has increased pain in her right knee.  She is interested in physical therapy as soon as possible.     Review of Systems  Constitutional: Negative.   Respiratory: Negative.   Cardiovascular: Negative.   Musculoskeletal: Positive for arthralgias (left ankle) and gait problem. Negative for joint swelling.  Skin: Negative.   Neurological: Negative.        Objective:   Physical Exam  Vitals reviewed. Constitutional: She is oriented to person, place, and time. She appears well-developed and well-nourished. No distress.  HENT:  Head: Normocephalic and atraumatic.  Pulmonary/Chest: Effort normal.  Musculoskeletal:       Left knee: Normal.       Left ankle: She exhibits decreased range of motion. She exhibits no swelling, no ecchymosis and normal pulse. Tenderness (mild, particularly over achilles). No lateral malleolus, no medial malleolus, no AITFL, no CF ligament, no head of 5th metatarsal and no proximal fibula tenderness found. Achilles tendon normal. Achilles tendon exhibits normal Thompson's  test results.  Neurological: She is alert and oriented to person, place, and time. She has normal strength. No sensory deficit.  Skin: Skin is warm and dry.  Psychiatric: She has a normal mood and affect. Her behavior is normal.    [facility] reading (PRIMARY) by  Dr. Clelia Croft - accessory ossicle still visible, unchanged from 1 wk ago; no other abnormality      Assessment & Plan:  Left ankle sprain, subsequent encounter - Plan: Ambulatory referral to Physical Therapy  Ankle injury, left, subsequent encounter - Plan: DG Ankle Complete Left   Christy Haley is a very pleasant 42 yr old female here for follow up on LEFT ankle sprain that occurred 1 wk ago.  At that time xray overread could not rule out a small chip fx vs accessory ossicle at the lateral ankle.  Repeat x-rays today show no change in this area.  She is completely non-tender in this area.  Clinically, I am more inclined to think this is an accessory ossicle rather than fracture.  On exam, she is mildly tender over the achilles, but I think this may be due to immobilization in the CAM walker.  She is able to bear weight but is nervous to do so.  Will transition her to sweedo brace.  Encouraged weight bearing and increasing activity as tolerated.  She definitely needs to be doing gentle ROM and stretching daily.  Christy Haley requests Christy Haley  with Christy Haley, and I have placed a referral for this.  Provided her with theraband and detailed instructions on home exercises.  Christy Haley to follow up if worsening or not improving greatly within the next 2 wks.    Loleta Dicker MHS, PA-C [facility] 11/4/20148:42 PM

## 2013-05-16 ENCOUNTER — Telehealth: Payer: Self-pay | Admitting: Physician Assistant

## 2013-05-16 NOTE — Telephone Encounter (Signed)
Spoke with pt regarding radiology over read.  "IMPRESSION:  Stable radiographic appearance of the ankle.  Small acute versus chronic avulsion fracture fragment suspected  lateral to the talus near the lateral malleolus."  Will keep her minimally weight bearing in CAM walker, and will refer to ortho.  Pt is already established at [facility] with Dr. Dion Saucier.  Will have TL call and schedule evaluation.

## 2013-06-13 ENCOUNTER — Ambulatory Visit: Payer: BC Managed Care – PPO

## 2013-06-13 ENCOUNTER — Ambulatory Visit
Admission: RE | Admit: 2013-06-13 | Discharge: 2013-06-13 | Disposition: A | Discharge status: Home / Self Care | Payer: BC Managed Care – PPO | Source: Ambulatory Visit

## 2013-06-13 DIAGNOSIS — Z1231 Encounter for screening mammogram for malignant neoplasm of breast: Secondary | ICD-10-CM

## 2013-07-28 ENCOUNTER — Ambulatory Visit (INDEPENDENT_AMBULATORY_CARE_PROVIDER_SITE_OTHER): Payer: BC Managed Care – PPO | Admitting: Physician Assistant

## 2013-07-28 VITALS — BP 120/70 | HR 61 | Temp 98.7°F | Resp 16 | Ht 62.0 in | Wt 128.0 lb

## 2013-07-28 DIAGNOSIS — G4700 Insomnia, unspecified: Secondary | ICD-10-CM

## 2013-07-28 DIAGNOSIS — F32A Depression, unspecified: Secondary | ICD-10-CM

## 2013-07-28 DIAGNOSIS — F3289 Other specified depressive episodes: Secondary | ICD-10-CM

## 2013-07-28 DIAGNOSIS — F329 Major depressive disorder, single episode, unspecified: Secondary | ICD-10-CM

## 2013-07-28 DIAGNOSIS — R51 Headache: Secondary | ICD-10-CM

## 2013-07-28 MED ORDER — SERTRALINE HCL 50 MG PO TABS: 50.0000 mg | ORAL_TABLET | Freq: Every day | ORAL | Status: DC

## 2013-07-28 MED ORDER — CLONAZEPAM 0.5 MG PO TABS: 0.5000 mg | ORAL_TABLET | Freq: Every day | ORAL | Status: DC

## 2013-07-28 NOTE — Progress Notes (Signed)
   Subjective:    Patient ID: Christy Haley, female    DOB: 09/15/1970, [age] y.o.   MRN: [id number]  HPI 43 year old female presents for evaluation of 1 month history of worsening depression, anxiety, and insomnia.  Symptoms started about 1 month ago when she started with divorce proceedings with her husband.  She has 1 child - a daughter who is [id number].  Admits she has been having a lot of trouble falling asleep, staying asleep, and has had decreased appetite.  No hx of depression or anxiety - has never been on any medications for this.  Does believe she needs to start something today.  Admits she is crying at least daily, sometimes more than once a day.  Has lost her appetite and does not enjoy going out with her daughter anymore.  Denies SI/HI.  Has tried a "sleeping pill" of her mother in laws that did not work. States she still woke up after 3 hours of sleep.  No other treatments tried.  Complains of headaches that are related to stress. Intermittent in nature and are relieved with OTC medications.  Patient is otherwise healthy with no other concerns today.      Review of Systems  Constitutional: Positive for appetite change (decreased).  Gastrointestinal: Negative for nausea and vomiting.  Psychiatric/Behavioral: Negative for suicidal ideas and self-injury. The patient is nervous/anxious.        Objective:   Physical Exam  Constitutional: She is oriented to person, place, and time. She appears well-developed and well-nourished.  HENT:  Head: Normocephalic and atraumatic.  Right Ear: External ear normal.  Left Ear: External ear normal.  Eyes: Conjunctivae are normal.  Neck: Normal range of motion.  Cardiovascular: Normal rate.   Pulmonary/Chest: Effort normal.  Neurological: She is alert and oriented to person, place, and time.  Psychiatric: Her behavior is normal. Judgment and thought content normal.  Tearful through exam          Assessment & Plan:  Depression - Plan:  clonazePAM (KLONOPIN) 0.5 MG tablet, sertraline (ZOLOFT) 50 MG tablet  Plan to start Zoloft 50 mg daily  Klonopin qhs for insomnia. If not allowing her to stay asleep through the whole night, can either increase dose or add trazodone.  Recheck in 4-6 weeks, sooner if worse.

## 2013-08-24 ENCOUNTER — Ambulatory Visit (INDEPENDENT_AMBULATORY_CARE_PROVIDER_SITE_OTHER): Payer: BC Managed Care – PPO | Admitting: Physician Assistant

## 2013-08-24 VITALS — BP 124/80 | HR 60 | Temp 98.1°F | Resp 16 | Ht 62.5 in | Wt 127.2 lb

## 2013-08-24 DIAGNOSIS — F3289 Other specified depressive episodes: Secondary | ICD-10-CM

## 2013-08-24 DIAGNOSIS — G4700 Insomnia, unspecified: Secondary | ICD-10-CM

## 2013-08-24 DIAGNOSIS — F329 Major depressive disorder, single episode, unspecified: Secondary | ICD-10-CM

## 2013-08-24 DIAGNOSIS — F32A Depression, unspecified: Secondary | ICD-10-CM

## 2013-08-24 MED ORDER — CLONAZEPAM 0.5 MG PO TABS: 0.5000 mg | ORAL_TABLET | Freq: Every day | ORAL | Status: DC

## 2013-08-24 MED ORDER — BUPROPION HCL ER (XL) 150 MG PO TB24: 150.0000 mg | ORAL_TABLET | Freq: Every day | ORAL | Status: DC

## 2013-08-24 NOTE — Progress Notes (Signed)
   Subjective:    Patient ID: Christy Haley, female    DOB: 06-Jul-1971, 43 y.o.   MRN: [id number]  HPI 43 year old female presents for recheck on depression after starting Zoloft 50 mg daily and Klonopin 0.5 mg qhs for sleep. Started medications on 1/17 - states overall she does think they are helping, especially the Klonopin. She is able to sleep well through the night and is waking rested.  Tried for 1 night to not take the medicine and was up all night with racing thoughts and anxiety.   While the zoloft does seem to be helping her mood, she admits that it does seem to make her jittery and feel like she is "on caffeine."  Does admit that she is crying less and has her appetite back. Still has "bad days" but admits there are fewer of these.   She has 2 sisters who have depression treated with Wellbutrin XL 150 mg daily.  She is interested in trying this. Despite the fact that the Zoloft seems to be helping her mood, she does not like how it is making her feel.  She has taken it daily for 1 month and the symptoms are not improving.    Divorce is still progressing - she does feel like this is going to be a healthy change but admits it is a hardship since she has been married for 23 years. They have 1 daughter who is at college at [employer].  Has a mother and sister who liver in [place].   Is interested in counseling but can't afford it due to high deductible plan. She is instead going to church more which is strengthening her support system.  No SI/HI.     Review of Systems  Cardiovascular: Negative for chest pain.  Gastrointestinal: Negative for nausea and vomiting.  Psychiatric/Behavioral: Negative for suicidal ideas and sleep disturbance.       Objective:   Physical Exam  Constitutional: She is oriented to person, place, and time. She appears well-developed and well-nourished.  HENT:  Head: Normocephalic and atraumatic.  Right Ear: External ear normal.  Left Ear: External ear  normal.  Eyes: Conjunctivae are normal.  Neck: Normal range of motion.  Cardiovascular: Normal rate.   Pulmonary/Chest: Effort normal.  Neurological: She is alert and oriented to person, place, and time.  Psychiatric: She has a normal mood and affect. Her behavior is normal. Judgment and thought content normal.          Assessment & Plan:   Depression - Plan: buPROPion (WELLBUTRIN XL) 150 MG 24 hr tablet, clonazePAM (KLONOPIN) 0.5 MG tablet  Insomnia  D/C Zoloft by taken 1/2 tablet until gone. Then start Wellbutrin 150 mg XL daily.  Ok to switch to immediate release if not covered by insurance. Trial for 4-6 weeks then recheck.  Refilled Klonopin 0.5 mg to take at bedtime. Ok to refill x 1 month then needs recheck.

## 2013-09-20 ENCOUNTER — Telehealth: Payer: Self-pay

## 2013-09-20 DIAGNOSIS — F329 Major depressive disorder, single episode, unspecified: Secondary | ICD-10-CM

## 2013-09-20 DIAGNOSIS — F32A Depression, unspecified: Secondary | ICD-10-CM

## 2013-09-20 MED ORDER — CLONAZEPAM 0.5 MG PO TABS: 0.5000 mg | ORAL_TABLET | Freq: Every day | ORAL | Status: DC

## 2013-09-20 NOTE — Telephone Encounter (Signed)
Rx refilled. Need to recheck in 1 month

## 2013-09-20 NOTE — Telephone Encounter (Signed)
NEEDS REFILL OF CLONAZEPAM

## 2013-09-21 NOTE — Telephone Encounter (Signed)
Faxed. Notified pt done and needs OV next mos for RF on VM.

## 2013-10-05 ENCOUNTER — Ambulatory Visit (INDEPENDENT_AMBULATORY_CARE_PROVIDER_SITE_OTHER): Payer: BC Managed Care – PPO | Admitting: Physician Assistant

## 2013-10-05 VITALS — BP 118/64 | HR 64 | Temp 98.7°F | Resp 16 | Ht 61.0 in | Wt 126.0 lb

## 2013-10-05 DIAGNOSIS — F32A Depression, unspecified: Secondary | ICD-10-CM

## 2013-10-05 DIAGNOSIS — F3289 Other specified depressive episodes: Secondary | ICD-10-CM

## 2013-10-05 DIAGNOSIS — F329 Major depressive disorder, single episode, unspecified: Secondary | ICD-10-CM

## 2013-10-05 NOTE — Progress Notes (Signed)
   Subjective:    Patient ID: Christy Haley, female    DOB: November 06, 1970, 43 y.o.   MRN: [id number]  HPI [age] year old female presents for recheck of anxiety/depression due to ongoing divorce.  At last OV she was switched from Zoloft to Wellbutrin per patient's request.  Admits she is doing well on Wellbutrin - is tolerated without any side effects.  Does still have "sad days" and is "crying a lot" but does think the medicine is working.   Has not seen a counselor yet because she is concerned about her high deductible, but does think that she needs to make an appointment.  Would like names of counselors in the area because she believes this will help her.  She is sleeping well.   Had a recent visit from her older daughter who came home from college for her spring break. Admits she felt bad because her daughter noticed that being in the house "feels depressing." Patient is upset because her symptoms seem to be affecting her children. Does still have her 43 year old daughter at home.       Review of Systems  Constitutional: Negative for unexpected weight change.  Psychiatric/Behavioral: Negative for suicidal ideas, behavioral problems and confusion. The patient is not nervous/anxious.        Objective:   Physical Exam  Constitutional: She is oriented to person, place, and time. She appears well-developed and well-nourished.  HENT:  Head: Normocephalic and atraumatic.  Right Ear: External ear normal.  Left Ear: External ear normal.  Eyes: Conjunctivae are normal.  Neck: Normal range of motion.  Cardiovascular: Normal rate.   Pulmonary/Chest: Effort normal.  Neurological: She is alert and oriented to person, place, and time.  Psychiatric: She has a normal mood and affect. Her behavior is normal. Judgment and thought content normal.          Assessment & Plan:   Depression  Continue current treatment plan. I recommended that she increase Wellbutrin from 150 mg to 300 mg daily.  She does not wish to do that right now.  Would like to try counseling first and will let me know via phone call how she is doing.  Ok to increase to Wellbutrin XL 300 mg if patient desires. Continue Klonopin 0.5 mg at bedtime as this is working well.  Recheck in 6 months, sooner if worse.

## 2013-10-05 NOTE — Patient Instructions (Addendum)
[facility] [place] [phone]  Vonna Kotykancy Guttman [phone]  [facility] [phone]  [facility] -Alan RipperClaire Huprich [phone]

## 2013-10-22 ENCOUNTER — Telehealth: Payer: Self-pay

## 2013-10-22 DIAGNOSIS — F329 Major depressive disorder, single episode, unspecified: Secondary | ICD-10-CM

## 2013-10-22 DIAGNOSIS — F32A Depression, unspecified: Secondary | ICD-10-CM

## 2013-10-22 MED ORDER — CLONAZEPAM 0.5 MG PO TABS: 0.5000 mg | ORAL_TABLET | Freq: Every day | ORAL | Status: DC

## 2013-10-22 MED ORDER — BUPROPION HCL ER (XL) 150 MG PO TB24: 150.0000 mg | ORAL_TABLET | Freq: Every day | ORAL | Status: DC

## 2013-10-22 NOTE — Telephone Encounter (Signed)
Pt is calling for a refill on klonpin and wellbutrin

## 2013-10-22 NOTE — Telephone Encounter (Signed)
Rx printed

## 2013-10-22 NOTE — Telephone Encounter (Signed)
Faxed klonopin Rx. Notified pt.

## 2013-11-29 ENCOUNTER — Telehealth: Payer: Self-pay

## 2013-11-29 DIAGNOSIS — F32A Depression, unspecified: Secondary | ICD-10-CM

## 2013-11-29 DIAGNOSIS — F329 Major depressive disorder, single episode, unspecified: Secondary | ICD-10-CM

## 2013-11-29 NOTE — Telephone Encounter (Signed)
Pt is calling for refill on klonopin

## 2013-12-04 ENCOUNTER — Ambulatory Visit (INDEPENDENT_AMBULATORY_CARE_PROVIDER_SITE_OTHER): Payer: BC Managed Care – PPO | Admitting: Emergency Medicine

## 2013-12-04 VITALS — BP 120/70 | HR 60 | Temp 98.0°F | Resp 18 | Ht 61.0 in | Wt 118.0 lb

## 2013-12-04 DIAGNOSIS — F3289 Other specified depressive episodes: Secondary | ICD-10-CM

## 2013-12-04 DIAGNOSIS — F411 Generalized anxiety disorder: Secondary | ICD-10-CM

## 2013-12-04 DIAGNOSIS — F329 Major depressive disorder, single episode, unspecified: Secondary | ICD-10-CM

## 2013-12-04 DIAGNOSIS — F32A Depression, unspecified: Secondary | ICD-10-CM

## 2013-12-04 MED ORDER — CLONAZEPAM 0.5 MG PO TABS: 0.5000 mg | ORAL_TABLET | Freq: Every day | ORAL | Status: DC

## 2013-12-04 MED ORDER — BUPROPION HCL ER (XL) 150 MG PO TB24: 300.0000 mg | ORAL_TABLET | Freq: Every day | ORAL | Status: DC

## 2013-12-04 NOTE — Progress Notes (Addendum)
   Subjective:    Patient ID: Christy Haley, female    DOB: [birth year], 44 y.o.   MRN: [id number]  HPI 43 yo female with complaint of anxiety increasing despite current medications.  On Wellbutrin and Clonazepam for past 5-6 months with improved sleep and less depression, but continued anxiety.  Tolerating medications wihtout other issues.  No SI/HI.  PPMH:  GERD, Anemia  SH:  Divorced, non smoker   Review of Systems  Constitutional: Negative for fever and chills.  Psychiatric/Behavioral: Negative for suicidal ideas, behavioral problems, confusion, sleep disturbance, self-injury, decreased concentration and agitation. The patient is nervous/anxious.        Objective:   Physical Exam Blood pressure 120/70, pulse 60, temperature 98 F (36.7 C), temperature source Oral, resp. rate 18, height 5\' 1"  (1.549 m), weight 118 lb (53.524 kg), SpO2 100.00%. Body mass index is 22.31 kg/(m^2). Well-developed, well nourished female who is awake, alert and oriented, in NAD. HEENT: [place]/AT, PERRL, EOMI.  Sclera and conjunctiva are clear.   OP is clear. Neck: supple, non-tender, no lymphadenopathy, thyromegaly. Heart: RRR, no murmur Lungs: normal effort, CTA Extremities: no cyanosis, clubbing or edema. Skin: warm and dry without rash. Psychologic: good mood and appropriate affect, normal speech and behavior.     Assessment & Plan:  Depression and anxiety.  Recommended increase wellbutrin dose to 300mg .  Follow up in 2-4 weeks.  Continue going to therapist.   I have reviewed and agree with documentation. Robert P. Merla Riches, M.D.

## 2013-12-04 NOTE — Telephone Encounter (Signed)
Rx printed.  Meds ordered this encounter  Medications  . clonazePAM (KLONOPIN) 0.5 MG tablet    Sig: Take 1 tablet (0.5 mg total) by mouth at bedtime.    Dispense:  30 tablet    Refill:  0    Order Specific Question:  Supervising Provider    Answer:  DOOLITTLE, ROBERT P [[id number]]

## 2013-12-04 NOTE — Telephone Encounter (Signed)
Will one of you please print this klonopin refill for Christy Haley's patient?  Pt called 4 days ago, but message was just now routed to [place].  Per last OV note - continue current medications, pt to follow up in Sept 2015.  Thanks

## 2013-12-06 NOTE — Telephone Encounter (Signed)
Called in Rx and notified pt on VM. 

## 2014-01-02 ENCOUNTER — Telehealth: Payer: Self-pay

## 2014-01-02 DIAGNOSIS — F32A Depression, unspecified: Secondary | ICD-10-CM

## 2014-01-02 DIAGNOSIS — F329 Major depressive disorder, single episode, unspecified: Secondary | ICD-10-CM

## 2014-01-02 MED ORDER — CLONAZEPAM 0.5 MG PO TABS: 0.5000 mg | ORAL_TABLET | Freq: Every day | ORAL | Status: DC

## 2014-01-02 NOTE — Telephone Encounter (Signed)
Pt would like a refill on clonazepam. Best#[phone]

## 2014-01-02 NOTE — Telephone Encounter (Signed)
Called in. Notified pt done and needs OV. Pt agreed.

## 2014-01-02 NOTE — Telephone Encounter (Signed)
Rx printed.  Please advise patient that she needs to be seen before this can be filled again.  Meds ordered this encounter  Medications  . clonazePAM (KLONOPIN) 0.5 MG tablet    Sig: Take 1 tablet (0.5 mg total) by mouth at bedtime. Need office visit for additional refills.    Dispense:  30 tablet    Refill:  0    Order Specific Question:  Supervising Provider    Answer:  DOOLITTLE, ROBERT P [[id number]]

## 2014-02-15 ENCOUNTER — Ambulatory Visit (INDEPENDENT_AMBULATORY_CARE_PROVIDER_SITE_OTHER): Payer: BC Managed Care – PPO | Admitting: Physician Assistant

## 2014-02-15 VITALS — BP 126/80 | HR 60 | Temp 98.5°F | Resp 17 | Ht 61.5 in | Wt 130.0 lb

## 2014-02-15 DIAGNOSIS — F3289 Other specified depressive episodes: Secondary | ICD-10-CM

## 2014-02-15 DIAGNOSIS — F329 Major depressive disorder, single episode, unspecified: Secondary | ICD-10-CM

## 2014-02-15 DIAGNOSIS — F32A Depression, unspecified: Secondary | ICD-10-CM

## 2014-02-15 MED ORDER — BUPROPION HCL ER (XL) 150 MG PO TB24: 150.0000 mg | ORAL_TABLET | Freq: Every day | ORAL | Status: DC

## 2014-02-15 MED ORDER — CLONAZEPAM 0.5 MG PO TABS: 0.5000 mg | ORAL_TABLET | Freq: Every day | ORAL | Status: DC

## 2014-02-15 NOTE — Patient Instructions (Signed)
Porfirio Oarhelle Jeffery, PA-C Benny LennertSarah Weber, PA-C Deboraha Sprangebbie Gessner, NP

## 2014-02-16 NOTE — Progress Notes (Signed)
   Subjective:    Patient ID: Christy Haley, female    DOB: 06-May-1971, 43 y.o.   MRN: [id number]  HPI 43 year old female presents today for recheck of her anxiety/depression. She is taking Wellbutrin XL 150 mg daily. She was here on 12/04/13 and saw Dr. Lorri FrederickMcGrath for increased frequency of panic attacks. He recommend she increase her Wellbutrin to 150 mg bid. She reports she has not done this, but does seem to be doing better. She continues to see her counselor who she reports is helping her a great deal. She has learned many non-pharmacologic coping mechanisms that work well for her. Continues to use the Klonopin 0.25-0.5 mg daily. Generally only needs 0.25 mg, but will take the other half "if it's a bad day."  She is no longer crying and is now back to her normal weight due to appetite has returned. Overall, she is satisfied with the progress she is making. She does hope to be able to wean off medications at some point in the future, but she is not ready yet.     Review of Systems  Constitutional: Negative for appetite change.  Gastrointestinal: Negative for nausea and vomiting.  Neurological: Negative for headaches.  Psychiatric/Behavioral: Negative for suicidal ideas.       Objective:   Physical Exam  Constitutional: She is oriented to person, place, and time. She appears well-developed and well-nourished.  HENT:  Head: Normocephalic and atraumatic.  Right Ear: External ear normal.  Left Ear: External ear normal.  Eyes: Conjunctivae are normal.  Neck: Normal range of motion.  Cardiovascular: Normal rate.   Pulmonary/Chest: Effort normal.  Neurological: She is alert and oriented to person, place, and time.  Psychiatric: She has a normal mood and affect. Her behavior is normal. Judgment and thought content normal.          Assessment & Plan:  Depression - Plan: buPROPion (WELLBUTRIN XL) 150 MG 24 hr tablet, clonazePAM (KLONOPIN) 0.5 MG tablet  Plan to continue current  treatment plan. Wellbutrin XL 150 mg daily and Klonopin 0.25-0.5 mg daily Recheck in 6 months or sooner if needed.

## 2014-03-25 ENCOUNTER — Telehealth: Payer: Self-pay

## 2014-03-25 DIAGNOSIS — F329 Major depressive disorder, single episode, unspecified: Secondary | ICD-10-CM

## 2014-03-25 DIAGNOSIS — F32A Depression, unspecified: Secondary | ICD-10-CM

## 2014-03-25 MED ORDER — CLONAZEPAM 0.5 MG PO TABS: 0.5000 mg | ORAL_TABLET | Freq: Every day | ORAL | Status: DC

## 2014-03-25 NOTE — Telephone Encounter (Signed)
rx printed.  Meds ordered this encounter  Medications  . clonazePAM (KLONOPIN) 0.5 MG tablet    Sig: Take 1 tablet (0.5 mg total) by mouth at bedtime.    Dispense:  30 tablet    Refill:  0    Order Specific Question:  Supervising Provider    Answer:  DOOLITTLE, ROBERT P [[id number]]

## 2014-03-25 NOTE — Telephone Encounter (Signed)
Pt calling requesting refill on clonazepam rx.   Best# [phone]

## 2014-03-26 NOTE — Telephone Encounter (Signed)
rx faxed pt notified.

## 2014-05-01 ENCOUNTER — Telehealth: Payer: Self-pay

## 2014-05-01 DIAGNOSIS — F329 Major depressive disorder, single episode, unspecified: Secondary | ICD-10-CM

## 2014-05-01 DIAGNOSIS — F32A Depression, unspecified: Secondary | ICD-10-CM

## 2014-05-01 MED ORDER — CLONAZEPAM 0.5 MG PO TABS: 0.2500 mg | ORAL_TABLET | Freq: Every evening | ORAL | Status: DC | PRN

## 2014-05-01 NOTE — Telephone Encounter (Signed)
LMOM that rx was faxed and to f/u in Feb

## 2014-05-01 NOTE — Telephone Encounter (Signed)
Pt is calling for a refill on klonopin Please call pt to advise

## 2014-05-01 NOTE — Telephone Encounter (Signed)
Seen 02/2014 by Ms. Marte, PA-C. Plan follow-up in 6 months (08/2014).  Meds ordered this encounter  Medications  . clonazePAM (KLONOPIN) 0.5 MG tablet    Sig: Take 0.5-1 tablets (0.25-0.5 mg total) by mouth at bedtime as needed for anxiety.    Dispense:  30 tablet    Refill:  0    Order Specific Question:  Supervising Provider    Answer:  DOOLITTLE, ROBERT P [[id number]]

## 2014-05-06 ENCOUNTER — Other Ambulatory Visit: Payer: Self-pay

## 2014-05-06 DIAGNOSIS — Z1231 Encounter for screening mammogram for malignant neoplasm of breast: Secondary | ICD-10-CM

## 2014-05-24 ENCOUNTER — Encounter: Payer: BC Managed Care – PPO | Admitting: Gynecology

## 2014-05-28 ENCOUNTER — Encounter: Payer: Self-pay | Admitting: Gynecology

## 2014-05-28 ENCOUNTER — Other Ambulatory Visit (HOSPITAL_COMMUNITY)
Admission: RE | Admit: 2014-05-28 | Discharge: 2014-05-28 | Disposition: A | Discharge status: Home / Self Care | Payer: BC Managed Care – PPO | Source: Ambulatory Visit | Attending: Gynecology | Admitting: Gynecology

## 2014-05-28 ENCOUNTER — Ambulatory Visit (INDEPENDENT_AMBULATORY_CARE_PROVIDER_SITE_OTHER): Payer: BC Managed Care – PPO | Admitting: Gynecology

## 2014-05-28 VITALS — BP 122/78 | Ht 62.0 in | Wt 127.0 lb

## 2014-05-28 DIAGNOSIS — Z01419 Encounter for gynecological examination (general) (routine) without abnormal findings: Secondary | ICD-10-CM | POA: Diagnosis present

## 2014-05-28 DIAGNOSIS — Z1151 Encounter for screening for human papillomavirus (HPV): Secondary | ICD-10-CM | POA: Insufficient documentation

## 2014-05-28 DIAGNOSIS — Z23 Encounter for immunization: Secondary | ICD-10-CM

## 2014-05-28 LAB — TSH: TSH: 1.214 u[IU]/mL (ref 0.350–4.500)

## 2014-05-28 LAB — CBC WITH DIFFERENTIAL/PLATELET
Basophils Absolute: 0 10*3/uL (ref 0.0–0.1)
Basophils Relative: 0 % (ref 0–1)
Eosinophils Absolute: 0.1 10*3/uL (ref 0.0–0.7)
Eosinophils Relative: 3 % (ref 0–5)
HCT: 36.9 % (ref 36.0–46.0)
Hemoglobin: 12.1 g/dL (ref 12.0–15.0)
Lymphocytes Relative: 26 % (ref 12–46)
Lymphs Abs: 0.7 10*3/uL (ref 0.7–4.0)
MCH: 27.6 pg (ref 26.0–34.0)
MCHC: 32.8 g/dL (ref 30.0–36.0)
MCV: 84.2 fL (ref 78.0–100.0)
MPV: 9.5 fL (ref 9.4–12.4)
Monocytes Absolute: 0.3 10*3/uL (ref 0.1–1.0)
Monocytes Relative: 9 % (ref 3–12)
Neutro Abs: 1.7 10*3/uL (ref 1.7–7.7)
Neutrophils Relative %: 62 % (ref 43–77)
Platelets: 306 10*3/uL (ref 150–400)
RBC: 4.38 MIL/uL (ref 3.87–5.11)
RDW: 14.1 % (ref 11.5–15.5)
WBC: 2.8 10*3/uL — ABNORMAL LOW (ref 4.0–10.5)

## 2014-05-28 LAB — COMPREHENSIVE METABOLIC PANEL
ALT: 18 U/L (ref 0–35)
AST: 20 U/L (ref 0–37)
Albumin: 4.4 g/dL (ref 3.5–5.2)
Alkaline Phosphatase: 41 U/L (ref 39–117)
BUN: 17 mg/dL (ref 6–23)
CO2: 31 mEq/L (ref 19–32)
Calcium: 9.7 mg/dL (ref 8.4–10.5)
Chloride: 102 mEq/L (ref 96–112)
Creat: 0.88 mg/dL (ref 0.50–1.10)
Glucose, Bld: 85 mg/dL (ref 70–99)
Potassium: 4.3 mEq/L (ref 3.5–5.3)
Sodium: 139 mEq/L (ref 135–145)
Total Bilirubin: 0.6 mg/dL (ref 0.2–1.2)
Total Protein: 6.8 g/dL (ref 6.0–8.3)

## 2014-05-28 LAB — LIPID PANEL
Cholesterol: 155 mg/dL (ref 0–200)
HDL: 80 mg/dL (ref >39)
LDL Cholesterol: 64 mg/dL (ref 0–99)
Total CHOL/HDL Ratio: 1.9 Ratio
Triglycerides: 56 mg/dL (ref <150)
VLDL: 11 mg/dL (ref 0–40)

## 2014-05-28 NOTE — Patient Instructions (Signed)
Influenza Virus Vaccine injection (Fluarix) Qu es este medicamento? La VACUNA ANTIGRIPAL ayuda a disminuir el riesgo de contraer la influenza, tambin conocida como la gripe. La vacuna solo ayuda a protegerle contra algunas cepas de influenza. Esta vacuna no ayuda a reducir el riesgo de contraer influenza pandmica H1N1. Este medicamento puede ser utilizado para otros usos; si tiene alguna pregunta consulte con su proveedor de atencin mdica o con su farmacutico. MARCAS COMERCIALES DISPONIBLES: Fluarix, Fluzone Qu le debo informar a mi profesional de la salud antes de tomar este medicamento? Necesita saber si usted presenta alguno de los siguientes problemas o situaciones: -trastorno de sangrado como hemofilia -fiebre o infeccin -sndrome de Guillain-Barre u otros problemas neurolgicos -problemas del sistema inmunolgico -infeccin por el virus de la inmunodeficiencia humana (VIH) o SIDA -niveles bajos de plaquetas en la sangre -esclerosis mltiple -una reaccin alrgica o inusual a las vacunas antigripales, a los huevos, protenas de pollo, al ltex, a la gentamicina, a otros medicamentos, alimentos, colorantes o conservantes -si est embarazada o buscando quedar embarazada -si est amamantando a un beb Cmo debo utilizar este medicamento? Esta vacuna se administra mediante inyeccin por va intramuscular. Lo administra un profesional de la salud. Recibir una copia de informacin escrita sobre la vacuna antes de cada vacuna. Asegrese de leer este folleto cada vez cuidadosamente. Este folleto puede cambiar con frecuencia. Hable con su pediatra para informarse acerca del uso de este medicamento en nios. Puede requerir atencin especial. Sobredosis: Pngase en contacto inmediatamente con un centro toxicolgico o una sala de urgencia si usted cree que haya tomado demasiado medicamento. ATENCIN: Este medicamento es solo para usted. No comparta este medicamento con nadie. Qu sucede  si me olvido de una dosis? No se aplica en este caso. Qu puede interactuar con este medicamento? -quimioterapia o radioterapia -medicamentos que suprimen el sistema inmunolgico, tales como etanercept, anakinra, infliximab y adalimumab -medicamentos que tratan o previenen cogulos sanguneos, como warfarina -fenitona -medicamentos esteroideos, como la prednisona o la cortisona -teofilina -vacunas Puede ser que esta lista no menciona todas las posibles interacciones. Informe a su profesional de la salud de todos los productos a base de hierbas, medicamentos de venta libre o suplementos nutritivos que est tomando. Si usted fuma, consume bebidas alcohlicas o si utiliza drogas ilegales, indqueselo tambin a su profesional de la salud. Algunas sustancias pueden interactuar con su medicamento. A qu debo estar atento al usar este medicamento? Informe a su mdico o a su profesional de la salud sobre todos los efectos secundarios que persistan despus de 3 das. Llame a su proveedor de atencin mdica si se presentan sntomas inusuales dentro de las 6 semanas posteriores a la vacunacin. Es posible que todava pueda contraer la gripe, pero la enfermedad no ser tan fuerte como normalmente. No puede contraer la gripe de esta vacuna. La vacuna antigripal no le protege contra resfros u otras enfermedades que pueden causar fiebre. Debe vacunarse cada ao. Qu efectos secundarios puedo tener al utilizar este medicamento? Efectos secundarios que debe informar a su mdico o a su profesional de la salud tan pronto como sea posible: -reacciones alrgicas como erupcin cutnea, picazn o urticarias, hinchazn de la cara, labios o lengua Efectos secundarios que, por lo general, no requieren atencin mdica (debe informarlos a su mdico o a su profesional de la salud si persisten o si son molestos): -fiebre -dolor de cabeza -molestias y dolores musculares -dolor, sensibilidad, enrojecimiento o hinchazn en  el lugar de la inyeccin -cansancio o debilidad Puede ser que esta lista   no menciona todos los posibles efectos secundarios. Comunquese a su mdico por asesoramiento mdico sobre los efectos secundarios. Usted puede informar los efectos secundarios a la FDA por telfono al 1-800-FDA-1088. Dnde debo guardar mi medicina? Esta vacuna se administra solamente en clnicas, farmacias, consultorio mdico u otro consultorio de un profesional de la salud y no necesitar guardarlo en su domicilio. ATENCIN: Este folleto es un resumen. Puede ser que no cubra toda la posible informacin. Si usted tiene preguntas acerca de esta medicina, consulte con su mdico, su farmacutico o su profesional de la salud.  2015, Elsevier/Gold Standard. (2009-12-30 15:31:40)  

## 2014-05-28 NOTE — Addendum Note (Signed)
Addended by: Berna SpareASTILLO, Etienne Millward A on: 05/28/2014 08:34 AM   Modules accepted: Orders, SmartSet

## 2014-05-28 NOTE — Progress Notes (Signed)
Christy Haley 1970-12-07 [id number]   History:    43 y.o.  for annual gyn exam with no complaints today.patient was recently started on Klonopin for depression and anxiety by her PCP early this year. She is doing well otherwise. She is fasting today for blood work. Patient with no past history of any abnormal Pap smears. She had a Mirena IUD placed in 2010 days to be removed within the next month. She's having very light if any menstrual cycles.patient the past has had history of anemia. She has not followed up with her PCP is Dr.Paz.Patient with history of laparoscopic left ovarian cystectomy for benign ovarian cyst in 2007    Past medical history,surgical history, family history and social history were all reviewed and documented in the EPIC chart.  Gynecologic History No LMP recorded. Patient is not currently having periods (Reason: IUD). Contraception: IUD Last Pap: 2012. Results were: normal Last mammogram: 2014. Results were: dense she will have three-dimensional mammogram next week  Obstetric History OB History  Gravida Para Term Preterm AB SAB TAB Ectopic Multiple Living  3 2   1 1    2     # Outcome Date GA Lbr Len/2nd Weight Sex Delivery Anes PTL Lv  3 SAB           2 Para     F Vag-Spont     1 Para     F Vag-Spont          ROS: A ROS was performed and pertinent positives and negatives are included in the history.  GENERAL: No fevers or chills. HEENT: No change in vision, no earache, sore throat or sinus congestion. NECK: No pain or stiffness. CARDIOVASCULAR: No chest pain or pressure. No palpitations. PULMONARY: No shortness of breath, cough or wheeze. GASTROINTESTINAL: No abdominal pain, nausea, vomiting or diarrhea, melena or bright red blood per rectum. GENITOURINARY: No urinary frequency, urgency, hesitancy or dysuria. MUSCULOSKELETAL: No joint or muscle pain, no back pain, no recent trauma. DERMATOLOGIC: No rash, no itching, no lesions. ENDOCRINE: No  polyuria, polydipsia, no heat or cold intolerance. No recent change in weight. HEMATOLOGICAL: No anemia or easy bruising or bleeding. NEUROLOGIC: No headache, seizures, numbness, tingling or weakness. PSYCHIATRIC: No depression, no loss of interest in normal activity or change in sleep pattern.     Exam: chaperone present  BP 122/78 mmHg  Ht 5\' 2"  (1.575 m)  Wt 127 lb (57.607 kg)  BMI 23.22 kg/m2  Body mass index is 23.22 kg/(m^2).  General appearance : Well developed well nourished female. No acute distress HEENT: Neck supple, trachea midline, no carotid bruits, no thyroidmegaly Lungs: Clear to auscultation, no rhonchi or wheezes, or rib retractions  Heart: Regular rate and rhythm, no murmurs or gallops Breast:Examined in sitting and supine position were symmetrical in appearance, no palpable masses or tenderness,  no skin retraction, no nipple inversion, no nipple discharge, no skin discoloration, no axillary or supraclavicular lymphadenopathy Abdomen: no palpable masses or tenderness, no rebound or guarding Extremities: no edema or skin discoloration or tenderness  Pelvic:  Bartholin, Urethra, Skene Glands: Within normal limits             Vagina: No gross lesions or discharge  Cervix: pigmented lesion at 10:00 position of the ectocervix, IUD string seen  Uterus  anteverted, normal size, shape and consistency, non-tender and mobile  Adnexa  Without masses or tenderness  Anus and perineum  normal   Rectovaginal  normal sphincter tone without palpated masses  or tenderness             Hemoccult not indicated     Assessment/Plan:  43 y.o. female for annual exam Return back to the office next week for colposcopic evaluation of her cervix. A Pap smear was done today as well. This appears to be a pigmented area at the 10:00 position of the cervix. Questionable blood vessel questionable endometriosis questionable melanoma. Patient will return back in December to change her Mirena IUD. The  following labs were ordered today: CBC, fasting lipid profile, compresses a metabolic panel, TSH, and urinalysis. Patient did receive the vaccine today. Patient scheduled for mammogram next week. She will request a three-dimensional mammogram due to the fact her breasts on previous mammogram had demonstrated that there was dense. We discussed importance of calcium vitamin D and regular exercise for osteoporosis prevention.   Ok EdwardsFERNANDEZ,Christy Haley, 8:24 AM 05/28/2014

## 2014-05-29 ENCOUNTER — Telehealth: Payer: Self-pay | Admitting: *Deleted

## 2014-05-29 LAB — URINALYSIS W MICROSCOPIC + REFLEX CULTURE
Bilirubin Urine: NEGATIVE
Casts: NONE SEEN
Crystals: NONE SEEN
Glucose, UA: NEGATIVE mg/dL
Hgb urine dipstick: NEGATIVE
Ketones, ur: NEGATIVE mg/dL
Leukocytes, UA: NEGATIVE
Nitrite: NEGATIVE
Protein, ur: NEGATIVE mg/dL
Specific Gravity, Urine: 1.02 (ref 1.005–1.030)
Urobilinogen, UA: 0.2 mg/dL (ref 0.0–1.0)
pH: 6 (ref 5.0–8.0)

## 2014-05-29 LAB — CYTOLOGY - PAP

## 2014-05-29 NOTE — Telephone Encounter (Signed)
-----   Message from Keenan BachelorKatherine R Annas sent at 05/29/2014 12:25 PM EST ----- Regarding: referral to Hematologist Per Dr. Glenetta HewJF "Please inform patient that I would like to refer her to the hematologist/oncologist for further evaluation of her leukopenia (low white blood cell count.) Please make consult appointment for her. Debarah CrapeClaudia or Vassar CollegeBlanca may need to translate information. Thanks"  Patient informed. Patient will wait for them to call her.

## 2014-05-29 NOTE — Telephone Encounter (Signed)
referral faxed to cancer center they will contact pt to schedule.

## 2014-05-30 LAB — URINE CULTURE: Colony Count: 25000

## 2014-06-03 ENCOUNTER — Telehealth: Payer: Self-pay | Admitting: Hematology and Oncology

## 2014-06-03 NOTE — Telephone Encounter (Signed)
LEFT MESSGAE FOR PATIENT AND GAVE NP APPT FOR 12/01 @ 9:45 W/DR. GORSUCH.  WELCOME PACKET MAILED W/[facility].

## 2014-06-04 ENCOUNTER — Telehealth: Payer: Self-pay

## 2014-06-04 DIAGNOSIS — F32A Depression, unspecified: Secondary | ICD-10-CM

## 2014-06-04 DIAGNOSIS — F329 Major depressive disorder, single episode, unspecified: Secondary | ICD-10-CM

## 2014-06-04 NOTE — Telephone Encounter (Signed)
Patient needs refill of Clonazepam faxed to her pharmacy [facility]   Her number is [phone]

## 2014-06-05 MED ORDER — CLONAZEPAM 0.5 MG PO TABS: 0.2500 mg | ORAL_TABLET | Freq: Every evening | ORAL | Status: DC | PRN

## 2014-06-05 NOTE — Telephone Encounter (Signed)
rx printed.  Meds ordered this encounter  Medications  . clonazePAM (KLONOPIN) 0.5 MG tablet    Sig: Take 0.5-1 tablets (0.25-0.5 mg total) by mouth at bedtime as needed for anxiety.    Dispense:  30 tablet    Refill:  0    Order Specific Question:  Supervising Provider    Answer:  DOOLITTLE, ROBERT P [[id number]]

## 2014-06-05 NOTE — Telephone Encounter (Signed)
rx faxed  Pt notified

## 2014-06-10 NOTE — Telephone Encounter (Signed)
Appointment 06/11/14 @ 9:45 pm

## 2014-06-11 ENCOUNTER — Ambulatory Visit: Payer: BC Managed Care – PPO

## 2014-06-11 ENCOUNTER — Ambulatory Visit (HOSPITAL_BASED_OUTPATIENT_CLINIC_OR_DEPARTMENT_OTHER): Payer: BC Managed Care – PPO | Admitting: Hematology and Oncology

## 2014-06-11 ENCOUNTER — Telehealth: Payer: Self-pay | Admitting: Hematology and Oncology

## 2014-06-11 ENCOUNTER — Encounter: Payer: Self-pay | Admitting: Hematology and Oncology

## 2014-06-11 VITALS — BP 124/71 | HR 72 | Temp 98.4°F | Resp 18 | Ht 62.0 in | Wt 129.9 lb

## 2014-06-11 DIAGNOSIS — D72819 Decreased white blood cell count, unspecified: Secondary | ICD-10-CM

## 2014-06-11 LAB — CBC WITH DIFFERENTIAL/PLATELET
BASO%: 0.3 % (ref 0.0–2.0)
Basophils Absolute: 0 10*3/uL (ref 0.0–0.1)
EOS%: 1.2 % (ref 0.0–7.0)
Eosinophils Absolute: 0 10*3/uL (ref 0.0–0.5)
HCT: 38.7 % (ref 34.8–46.6)
HGB: 12.5 g/dL (ref 11.6–15.9)
LYMPH%: 22.7 % (ref 14.0–49.7)
MCH: 27.6 pg (ref 25.1–34.0)
MCHC: 32.2 g/dL (ref 31.5–36.0)
MCV: 85.7 fL (ref 79.5–101.0)
MONO#: 0.3 10*3/uL (ref 0.1–0.9)
MONO%: 7.9 % (ref 0.0–14.0)
NEUT#: 2.5 10*3/uL (ref 1.5–6.5)
NEUT%: 67.9 % (ref 38.4–76.8)
Platelets: 302 10*3/uL (ref 145–400)
RBC: 4.51 10*6/uL (ref 3.70–5.45)
RDW: 13.3 % (ref 11.2–14.5)
WBC: 3.7 10*3/uL — ABNORMAL LOW (ref 3.9–10.3)
lymph#: 0.8 10*3/uL — ABNORMAL LOW (ref 0.9–3.3)

## 2014-06-11 LAB — MORPHOLOGY
PLT EST: ADEQUATE
RBC Comments: NORMAL

## 2014-06-11 LAB — CHCC SMEAR

## 2014-06-11 NOTE — Telephone Encounter (Signed)
Pt confirmed labs/ov per 12/01 POF, gave pt AVS.... KJ °

## 2014-06-11 NOTE — Assessment & Plan Note (Signed)
The cause of leukopenia is unknown. I will order additional workup for this. In the meantime, she is not symptomatic

## 2014-06-11 NOTE — Progress Notes (Signed)
[place] CONSULT NOTE  Patient Care Team: Colon Branch, MD as PCP - General (Internal Medicine)  CHIEF COMPLAINTS/PURPOSE OF CONSULTATION:   chronic leukopenia  HISTORY OF PRESENTING ILLNESS:  Christy Haley 43 y.o. female is here because of chronic leukopenia.  She was found to have abnormal CBC from routine blood work. The  White blood cell count ranged from 2.8-5.1 She denies prior blood or platelet transfusions.  denies history of recurrent infection.  Denies history of atypical infections She denies diagnosis of autoimmune disease or connective tissue disorder.   MEDICAL HISTORY:  Past Medical History  Diagnosis Date  . GERD (gastroesophageal reflux disease)   . Depression   . Anxiety     SURGICAL HISTORY: Past Surgical History  Procedure Laterality Date  . Pelvic laparoscopy      ovarian cystectomy  . Intrauterine device insertion  2010    Mirena    SOCIAL HISTORY: History   Social History  . Marital Status: Married    Spouse Name: N/A    Number of Children: 2  . Years of Education: N/A   Occupational History  . Public librarian     Social History Main Topics  . Smoking status: Never Smoker   . Smokeless tobacco: Never Used  . Alcohol Use: No  . Drug Use: No  . Sexual Activity: Yes    Birth Control/ Protection: IUD     Comment: Mirena IUD 03/31/2009   Other Topics Concern  . Not on file   Social History Narrative   Original from [place]    FAMILY HISTORY: Family History  Problem Relation Age of Onset  . Diabetes Maternal Grandmother   . Arthritis Mother   . Hypertension Mother   . Heart disease Neg Hx   . Hyperlipidemia Neg Hx   . Kidney disease Neg Hx   . Stroke Neg Hx   . Alcohol abuse Neg Hx   . Cancer Neg Hx   . COPD Neg Hx   . Early death Neg Hx     ALLERGIES:  has No Known Allergies.  MEDICATIONS:  Current Outpatient Prescriptions  Medication Sig Dispense Refill  . buPROPion (WELLBUTRIN XL) 150 MG 24 hr  tablet Take 1 tablet (150 mg total) by mouth daily. 90 tablet 1  . clonazePAM (KLONOPIN) 0.5 MG tablet Take 0.5-1 tablets (0.25-0.5 mg total) by mouth at bedtime as needed for anxiety. 30 tablet 0  . levonorgestrel (MIRENA) 20 MCG/24HR IUD 1 each by Intrauterine route once.      . mirtazapine (REMERON) 7.5 MG tablet Take 7.5 mg by mouth at bedtime.    . Multiple Vitamin (MULTIVITAMIN) tablet Take 1 tablet by mouth daily.       No current facility-administered medications for this visit.    REVIEW OF SYSTEMS:   Constitutional: Denies fevers, chills or abnormal night sweats Eyes: Denies blurriness of vision, double vision or watery eyes Ears, nose, mouth, throat, and face: Denies mucositis or sore throat Respiratory: Denies cough, dyspnea or wheezes Cardiovascular: Denies palpitation, chest discomfort or lower extremity swelling Gastrointestinal:  Denies nausea, heartburn or change in bowel habits Skin: Denies abnormal skin rashes Lymphatics: Denies new lymphadenopathy or easy bruising Neurological:Denies numbness, tingling or new weaknesses Behavioral/Psych: Mood is stable, no new changes  All other systems were reviewed with the patient and are negative.  PHYSICAL EXAMINATION: ECOG PERFORMANCE STATUS: 0 - Asymptomatic  Filed Vitals:   06/11/14 1022  BP: 124/71  Pulse: 72  Temp: 98.4 F (  36.9 C)  Resp: 18   Filed Weights   06/11/14 1022  Weight: 129 lb 14.4 oz (58.922 kg)    GENERAL:alert, no distress and comfortable SKIN: skin color, texture, turgor are normal, no rashes or significant lesions EYES: normal, conjunctiva are pink and non-injected, sclera clear OROPHARYNX:no exudate, no erythema and lips, buccal mucosa, and tongue normal  NECK: supple, thyroid normal size, non-tender, without nodularity LYMPH:  no palpable lymphadenopathy in the cervical, axillary or inguinal LUNGS: clear to auscultation and percussion with normal breathing effort HEART: regular rate &  rhythm and no murmurs and no lower extremity edema ABDOMEN:abdomen soft, non-tender and normal bowel sounds Musculoskeletal:no cyanosis of digits and no clubbing  PSYCH: alert & oriented x 3 with fluent speech NEURO: no focal motor/sensory deficits  LABORATORY DATA:  I have reviewed the data as listed Recent Results (from the past 2160 hour(s))  Cytology - PAP     Status: None   Collection Time: 05/28/14 12:00 AM  Result Value Ref Range   CYTOLOGY - PAP PAP RESULT   CBC with Differential     Status: Abnormal   Collection Time: 05/28/14  8:27 AM  Result Value Ref Range   WBC 2.8 (L) 4.0 - 10.5 K/uL   RBC 4.38 3.87 - 5.11 MIL/uL   Hemoglobin 12.1 12.0 - 15.0 g/dL   HCT 36.9 36.0 - 46.0 %   MCV 84.2 78.0 - 100.0 fL   MCH 27.6 26.0 - 34.0 pg   MCHC 32.8 30.0 - 36.0 g/dL   RDW 14.1 11.5 - 15.5 %   Platelets 306 150 - 400 K/uL   MPV 9.5 9.4 - 12.4 fL   Neutrophils Relative % 62 43 - 77 %   Neutro Abs 1.7 1.7 - 7.7 K/uL   Lymphocytes Relative 26 12 - 46 %   Lymphs Abs 0.7 0.7 - 4.0 K/uL   Monocytes Relative 9 3 - 12 %   Monocytes Absolute 0.3 0.1 - 1.0 K/uL   Eosinophils Relative 3 0 - 5 %   Eosinophils Absolute 0.1 0.0 - 0.7 K/uL   Basophils Relative 0 0 - 1 %   Basophils Absolute 0.0 0.0 - 0.1 K/uL   Smear Review Criteria for review not met   Comprehensive metabolic panel     Status: None   Collection Time: 05/28/14  8:27 AM  Result Value Ref Range   Sodium 139 135 - 145 mEq/L   Potassium 4.3 3.5 - 5.3 mEq/L   Chloride 102 96 - 112 mEq/L   CO2 31 19 - 32 mEq/L   Glucose, Bld 85 70 - 99 mg/dL   BUN 17 6 - 23 mg/dL   Creat 0.88 0.50 - 1.10 mg/dL   Total Bilirubin 0.6 0.2 - 1.2 mg/dL   Alkaline Phosphatase 41 39 - 117 U/L   AST 20 0 - 37 U/L   ALT 18 0 - 35 U/L   Total Protein 6.8 6.0 - 8.3 g/dL   Albumin 4.4 3.5 - 5.2 g/dL   Calcium 9.7 8.4 - 10.5 mg/dL  Lipid panel     Status: None   Collection Time: 05/28/14  8:27 AM  Result Value Ref Range   Cholesterol 155 0 -  200 mg/dL    Comment: ATP III Classification:       < 200        mg/dL        Desirable      200 - 239  mg/dL        Borderline High      >= 240        mg/dL        High      Triglycerides 56 <150 mg/dL   HDL 80 >39 mg/dL   Total CHOL/HDL Ratio 1.9 Ratio   VLDL 11 0 - 40 mg/dL   LDL Cholesterol 64 0 - 99 mg/dL    Comment:   Total Cholesterol/HDL Ratio:CHD Risk                        Coronary Heart Disease Risk Table                                        Men       Women          1/2 Average Risk              3.4        3.3              Average Risk              5.0        4.4           2X Average Risk              9.6        7.1           3X Average Risk             23.4       11.0 Use the calculated Patient Ratio above and the CHD Risk table  to determine the patient's CHD Risk. ATP III Classification (LDL):       < 100        mg/dL         Optimal      179 - 129     mg/dL         Near or Above Optimal      130 - 159     mg/dL         Borderline High      160 - 189     mg/dL         High       > 217        mg/dL         Very High     TSH     Status: None   Collection Time: 05/28/14  8:27 AM  Result Value Ref Range   TSH 1.214 0.350 - 4.500 uIU/mL  Urinalysis w microscopic + reflex cultur     Status: Abnormal   Collection Time: 05/28/14  8:28 AM  Result Value Ref Range   Color, Urine YELLOW YELLOW   APPearance CLEAR CLEAR   Specific Gravity, Urine 1.020 1.005 - 1.030   pH 6.0 5.0 - 8.0   Glucose, UA NEG NEG mg/dL   Bilirubin Urine NEG NEG   Ketones, ur NEG NEG mg/dL   Hgb urine dipstick NEG NEG   Protein, ur NEG NEG mg/dL   Urobilinogen, UA 0.2 0.0 - 1.0 mg/dL   Nitrite NEG NEG   Leukocytes, UA NEG NEG   Squamous Epithelial / LPF FEW RARE   Crystals NONE SEEN NONE SEEN   Casts NONE SEEN NONE SEEN  WBC, UA 0-2 <3 WBC/hpf   RBC / HPF 0-2 <3 RBC/hpf   Bacteria, UA FEW (A) RARE  Urine culture     Status: None   Collection Time: 05/28/14  8:28 AM  Result Value  Ref Range   Colony Count 25,000 COLONIES/ML    Organism ID, Bacteria Multiple bacterial morphotypes present, none    Organism ID, Bacteria predominant. Suggest appropriate recollection if     Organism ID, Bacteria clinically indicated.   HIV antibody (with reflex)     Status: None (Preliminary result)   Collection Time: 06/11/14 10:56 AM  Result Value Ref Range   HIV 1&2 Ab, 4th Generation NONREACTIVE NONREACTIVE    Comment:  A NONREACTIVE HIV Ag/Ab result does not exclude HIV infection sincethe time frame for seroconversion is variable. If acute HIV infectionis suspected, a HIV-1 RNA Qualitative TMA test is recommended. HIV-1/2 Antibody Diff         Not indicated.HIV-1 RNA,  Qual TMA           Not indicated. PLEASE NOTE: This information has been disclosed to you from [place] confidentiality may be protected by state law. If your staterequires such protection, then the state law prohibits you from [facility] further  disclosure of the information without the specific writtenconsent of the person to whom it pertains, or as otherwise permittedby law. A general authorization for the release of medical or otherinformation is NOT sufficient for this purpose. The  performance of this assay has not been clinically validated inpatients less than 78 years old.   Rheumatoid factor     Status: None (Preliminary result)   Collection Time: 06/11/14 10:56 AM  Result Value Ref Range   Rhuematoid fact SerPl-aCnc <10 <=14 IU/mL    Comment:                           Interpretive Table                    Low Positive: 15 - 41 IU/mL                    High Positive:  >= 42 IU/mL  In addition to the RF result, and clinical symptoms including joint involvement, the 2010 ACR Classification  Criteria for scoring/diagnosing Rheumatoid Arthritis include the results of the following tests:  CRP (25366), ESR (15010), and CCP (APCA) (44034). www.rheumatology.org/practice/clinical/classification/ra/ra_2010.asp    Hepatitis C antibody     Status: None (Preliminary result)   Collection Time: 06/11/14 10:56 AM  Result Value Ref Range   HCV Ab NEGATIVE NEGATIVE  Vitamin B12     Status: Abnormal (Preliminary result)   Collection Time: 06/11/14 10:56 AM  Result Value Ref Range   Vitamin B-12 1149 (H) 211 - 911 pg/mL  CBC with Differential     Status: Abnormal   Collection Time: 06/11/14 10:56 AM  Result Value Ref Range   WBC 3.7 (L) 3.9 - 10.3 10e3/uL   NEUT# 2.5 1.5 - 6.5 10e3/uL   HGB 12.5 11.6 - 15.9 g/dL   HCT 38.7 34.8 - 46.6 %   Platelets 302 145 - 400 10e3/uL   MCV 85.7 79.5 - 101.0 fL   MCH 27.6 25.1 - 34.0 pg   MCHC 32.2 31.5 - 36.0 g/dL   RBC 4.51 3.70 - 5.45 10e6/uL   RDW 13.3 11.2 - 14.5 %   lymph# 0.8 (L) 0.9 - 3.3 10e3/uL   MONO# 0.3  0.1 - 0.9 10e3/uL   Eosinophils Absolute 0.0 0.0 - 0.5 10e3/uL   Basophils Absolute 0.0 0.0 - 0.1 10e3/uL   NEUT% 67.9 38.4 - 76.8 %   LYMPH% 22.7 14.0 - 49.7 %   MONO% 7.9 0.0 - 14.0 %   EOS% 1.2 0.0 - 7.0 %   BASO% 0.3 0.0 - 2.0 %  Morphology     Status: None   Collection Time: 06/11/14 10:56 AM  Result Value Ref Range   RBC Comments Within Normal Limits Within Normal Limits   White Cell Comments C/W auto diff    PLT EST Adequate Adequate  Smear     Status: None   Collection Time: 06/11/14 10:56 AM  Result Value Ref Range   Smear Result Smear Available     ASSESSMENT & PLAN Leukopenia  The cause of leukopenia is unknown. I will order additional workup for this. In the meantime, she is not symptomatic

## 2014-06-11 NOTE — Progress Notes (Signed)
Checked in new pt with no financial concerns at this time.  Pt is here for a hematology concern so financial assistance may not be needed but she has Raquel's card for any questions or concerns. ° °

## 2014-06-12 LAB — SEDIMENTATION RATE: Sed Rate: 1 mm/hr (ref 0–22)

## 2014-06-12 LAB — RHEUMATOID FACTOR: Rhuematoid fact SerPl-aCnc: <10 IU/mL (ref <=14)

## 2014-06-12 LAB — ANA: Anti Nuclear Antibody(ANA): NEGATIVE

## 2014-06-12 LAB — HEPATITIS C ANTIBODY: HCV Ab: NEGATIVE

## 2014-06-12 LAB — HIV ANTIBODY (ROUTINE TESTING W REFLEX): HIV 1&2 Ab, 4th Generation: NONREACTIVE

## 2014-06-12 LAB — VITAMIN B12: Vitamin B-12: 1149 pg/mL — ABNORMAL HIGH (ref 211–911)

## 2014-06-14 ENCOUNTER — Ambulatory Visit
Admission: RE | Admit: 2014-06-14 | Discharge: 2014-06-14 | Disposition: A | Discharge status: Home / Self Care | Payer: BC Managed Care – PPO | Source: Ambulatory Visit

## 2014-06-14 DIAGNOSIS — Z1231 Encounter for screening mammogram for malignant neoplasm of breast: Secondary | ICD-10-CM

## 2014-06-18 ENCOUNTER — Ambulatory Visit (HOSPITAL_BASED_OUTPATIENT_CLINIC_OR_DEPARTMENT_OTHER): Payer: BC Managed Care – PPO | Admitting: Hematology and Oncology

## 2014-06-18 ENCOUNTER — Encounter: Payer: Self-pay | Admitting: Hematology and Oncology

## 2014-06-18 VITALS — BP 125/65 | HR 63 | Temp 98.1°F | Resp 18 | Ht 62.0 in | Wt 132.1 lb

## 2014-06-18 DIAGNOSIS — D509 Iron deficiency anemia, unspecified: Secondary | ICD-10-CM

## 2014-06-18 DIAGNOSIS — D72819 Decreased white blood cell count, unspecified: Secondary | ICD-10-CM

## 2014-06-18 NOTE — Progress Notes (Signed)
[facility] OFFICE PROGRESS NOTE  Christy OraJose Paz, MD SUMMARY OF HEMATOLOGIC HISTORY:  She was found to have abnormal CBC from routine blood work. The  White blood cell count ranged from 2.8-5.1 She denies prior blood or platelet transfusions.  denies history of recurrent infection.  Denies history of atypical infections She denies diagnosis of autoimmune disease or connective tissue disorder. In December 2012, she had extensive workup to exclude nutritional deficiency, chronic viral infection and autoimmune condition.  INTERVAL HISTORY: Christy Bravorene Teston-Rodriguez 43 y.o. female returns for further follow-up. She feels well.  I have reviewed the past medical history, past surgical history, social history and family history with the patient and they are unchanged from previous note.  ALLERGIES:  has No Known Allergies.  MEDICATIONS:  Current Outpatient Prescriptions  Medication Sig Dispense Refill  . buPROPion (WELLBUTRIN XL) 150 MG 24 hr tablet Take 1 tablet (150 mg total) by mouth daily. 90 tablet 1  . clonazePAM (KLONOPIN) 0.5 MG tablet Take 0.5-1 tablets (0.25-0.5 mg total) by mouth at bedtime as needed for anxiety. 30 tablet 0  . levonorgestrel (MIRENA) 20 MCG/24HR IUD 1 each by Intrauterine route once.      . mirtazapine (REMERON) 7.5 MG tablet Take 7.5 mg by mouth at bedtime.    . Multiple Vitamin (MULTIVITAMIN) tablet Take 1 tablet by mouth daily.       No current facility-administered medications for this visit.     REVIEW OF SYSTEMS:   Constitutional: Denies fevers, chills or night sweats Eyes: Denies blurriness of vision Ears, nose, mouth, throat, and face: Denies mucositis or sore throat Respiratory: Denies cough, dyspnea or wheezes Cardiovascular: Denies palpitation, chest discomfort or lower extremity swelling Gastrointestinal:  Denies nausea, heartburn or change in bowel habits Skin: Denies abnormal skin rashes Lymphatics: Denies new lymphadenopathy or easy  bruising Neurological:Denies numbness, tingling or new weaknesses Behavioral/Psych: Mood is stable, no new changes  All other systems were reviewed with the patient and are negative.  PHYSICAL EXAMINATION: ECOG PERFORMANCE STATUS: 0 - Asymptomatic  Filed Vitals:   06/18/14 0914  BP: 125/65  Pulse: 63  Temp: 98.1 F (36.7 C)  Resp: 18   Filed Weights   06/18/14 0914  Weight: 132 lb 1.6 oz (59.92 kg)    GENERAL:alert, no distress and comfortable Musculoskeletal:no cyanosis of digits and no clubbing  NEURO: alert & oriented x 3 with fluent speech, no focal motor/sensory deficits  LABORATORY DATA:  I have reviewed the data as listed No results found for this or any previous visit (from the past 48 hour(s)).  Lab Results  Component Value Date   WBC 3.7* 06/11/2014   HGB 12.5 06/11/2014   HCT 38.7 06/11/2014   MCV 85.7 06/11/2014   PLT 302 06/11/2014    ASSESSMENT & PLAN:  Leukopenia Extensive workup including vitamin B-12 level, screening for autoimmune disease and infectious disease were all negative. The most likely cause of leukopenia is likely constitutional. No further workup is needed. The patient does not need long-term follow-up here.  Iron deficiency anemia This has resolved. The patient can continue multivitamin as needed.   All questions were answered. The patient knows to call the clinic with any problems, questions or concerns. No barriers to learning was detected.  I spent 15 minutes counseling the patient face to face. The total time spent in the appointment was 20 minutes and more than 50% was on counseling.     Christy Larivee, MD 06/18/2014 9:30 AM

## 2014-06-18 NOTE — Assessment & Plan Note (Signed)
This has resolved. The patient can continue multivitamin as needed.

## 2014-06-18 NOTE — Assessment & Plan Note (Signed)
Extensive workup including vitamin B-12 level, screening for autoimmune disease and infectious disease were all negative. The most likely cause of leukopenia is likely constitutional. No further workup is needed. The patient does not need long-term follow-up here.

## 2014-06-21 ENCOUNTER — Ambulatory Visit (INDEPENDENT_AMBULATORY_CARE_PROVIDER_SITE_OTHER): Payer: BC Managed Care – PPO | Admitting: Gynecology

## 2014-06-21 ENCOUNTER — Encounter: Payer: Self-pay | Admitting: Gynecology

## 2014-06-21 VITALS — BP 120/76

## 2014-06-21 DIAGNOSIS — N3000 Acute cystitis without hematuria: Secondary | ICD-10-CM

## 2014-06-21 DIAGNOSIS — N889 Noninflammatory disorder of cervix uteri, unspecified: Secondary | ICD-10-CM

## 2014-06-21 LAB — URINALYSIS W MICROSCOPIC + REFLEX CULTURE
Bilirubin Urine: NEGATIVE
Casts: NONE SEEN
Crystals: NONE SEEN
Glucose, UA: NEGATIVE mg/dL
Hgb urine dipstick: NEGATIVE
Ketones, ur: NEGATIVE mg/dL
Nitrite: NEGATIVE
Protein, ur: NEGATIVE mg/dL
RBC / HPF: NONE SEEN RBC/hpf (ref <3)
Specific Gravity, Urine: 1.01 (ref 1.005–1.030)
Urobilinogen, UA: 0.2 mg/dL (ref 0.0–1.0)
pH: 6 (ref 5.0–8.0)

## 2014-06-21 MED ORDER — CIPROFLOXACIN HCL 250 MG PO TABS: 250.0000 mg | ORAL_TABLET | Freq: Two times a day (BID) | ORAL | Status: DC

## 2014-06-21 MED ORDER — PHENAZOPYRIDINE HCL 200 MG PO TABS: 200.0000 mg | ORAL_TABLET | Freq: Three times a day (TID) | ORAL | Status: DC | PRN

## 2014-06-21 NOTE — Progress Notes (Addendum)
   Patient presented to the office for colposcopic evaluation. Patient was seen in the office in November 17 of this year whereby incidental finding during pelvic exam demonstrated a pigmented lesion of the ectocervix at the 11:00 position. A Pap smear was done at that time and the results came back normal. She is here for colposcopic evaluation and biopsy. Patient currently has a Mirena IUD that needs to be changed she will return next month to have this done. Review of her record and indicated that she had been referred to the hematologist oncologist as a result of her leukopenia and the conclusion is as follows:  " Extensive workup including vitamin B-12 level, screening for autoimmune disease and infectious disease were all negative. The most likely cause of leukopenia is likely constitutional. No further workup is needed. The patient does not need long-term follow-up here."      Patient underwent a detail colposcopic evaluation of the external genitalia, perineum and perirectal region with no lesion seen. The speculum was introduced into the vagina. A systematic inspection of the vaginal mucosa, fornix, and cervix was undertaken. The pigmented lesion flat at the 11:00 position was once again visualized it did not appear to be vascular but still there was a concern with of this was venous or an endometriotic implant or melanoma. For this reason patient was counseled and with the use of a Kevorkian biopsy instrument this area was biopsied immediately a chocolate-like material extruded as the whole lesion was removed and submitted for histological evaluation. Silver nitrate was used for hemostasis. The IUD string was present.  Assessment/plan: #1 leukopenia extensive workup by hematologist oncologist no significant to be constitutional no further workup #2 pigmented area of the ectocervix 11:00 position highly suspicious for endometriosis will await pathology report and notify patient. Pictures were  shown and no further treatment would be needed if indeed endometriosis and she is asymptomatic. Her sister has history of endometriosis. #3 patient will return back next month to change her IUD #4 patient was she came in was complaining of 2 days of urinary frequency and burning. Her urinalysis today demonstrated 11-20 WBC and many bacteria. Based on her symptoms she'll be treated for UTIs we wait for her urine culture. She will be prescribed Cipro 250 mg one by mouth twice a day for 3 days and Pyridium 200 mg 3 times a day for 3 days

## 2014-06-21 NOTE — Addendum Note (Signed)
Addended by: Berna SpareASTILLO, Tameka Hoiland A on: 06/21/2014 09:12 AM   Modules accepted: Orders

## 2014-06-21 NOTE — Addendum Note (Signed)
Addended by: Ok EdwardsFERNANDEZ, Maciah Schweigert H on: 06/21/2014 08:46 AM   Modules accepted: Orders

## 2014-06-21 NOTE — Patient Instructions (Signed)
Colposcopa, Cuidado posterior (Colposcopy, Care After) La colposcopa es un procedimiento en el que se utiliza una herramienta especial para magnificar la superficie del cuello del tero. Tambin es posible que se tome una muestra de tejido (biopsia). Esta muestra se observar para identificar la presencia de cncer cervical u otros problemas. Despus del procedimiento:  Podr sentir algunos clicos.  Recustese algunos minutos si se siente mareada.  Podr tener un sangrado que debera detenerse luego de algunos das. CUIDADOS EN EL HOGAR  No tenga relaciones sexuales ni use tampones durante 2 o 3 das o segn le hayan indicado.  Slo tome medicamentos como lo indique su mdico.  Contine tomando las pastillas anticonceptivas de la forma habitual. Averige los resultados de su anlisis Pregunte cundo estarn listos los resultados del examen. Asegrese de obtener los resultados. SOLICITE AYUDA DE INMEDIATO SI:  Tiene un sangrado abundante o elimina cogulos.  Su temperatura es de 102 F (38.9 C) o mayor.  Observa una secrecin vaginal anormal.  Tiene clicos que no se van con los medicamentos.  Siente mareos, vrtigo o pierde el conocimiento (se desmaya). ASEGRESE DE QUE:   Comprende estas instrucciones.  Controlar su enfermedad.  Solicitar ayuda de inmediato si no mejora o si empeora. Document Released: 07/31/2010 Document Revised: 09/20/2011 ExitCare Patient Information 2015 [employer]. This information is not intended to replace advice given to you by your health care provider. Make sure you discuss any questions you have with your health care provider.  

## 2014-06-23 LAB — URINE CULTURE: Colony Count: >=100000

## 2014-07-22 ENCOUNTER — Ambulatory Visit (INDEPENDENT_AMBULATORY_CARE_PROVIDER_SITE_OTHER): Payer: BLUE CROSS/BLUE SHIELD | Admitting: Physician Assistant

## 2014-07-22 VITALS — BP 108/64 | HR 60 | Temp 98.6°F | Resp 16 | Ht 63.0 in | Wt 131.0 lb

## 2014-07-22 DIAGNOSIS — G4700 Insomnia, unspecified: Secondary | ICD-10-CM

## 2014-07-22 DIAGNOSIS — F32A Depression, unspecified: Secondary | ICD-10-CM

## 2014-07-22 DIAGNOSIS — F329 Major depressive disorder, single episode, unspecified: Secondary | ICD-10-CM

## 2014-07-22 MED ORDER — BUPROPION HCL ER (XL) 150 MG PO TB24: 150.0000 mg | ORAL_TABLET | Freq: Every day | ORAL | Status: DC

## 2014-07-22 MED ORDER — CLONAZEPAM 0.5 MG PO TABS: 0.2500 mg | ORAL_TABLET | Freq: Every evening | ORAL | Status: DC | PRN

## 2014-07-22 NOTE — Progress Notes (Signed)
Subjective:    Patient ID: Christy Haley, female    DOB: 08/11/1970, 44 y.o.   MRN: [id number]   PCP: Willow Ora, MD  Chief Complaint  Patient presents with  . Medication Refill    Clonazepam    No Known Allergies  Patient Active Problem List   Diagnosis Date Noted  . Cervical lesion 06/21/2014  . Hyperglycemia 04/24/2013  . Muscle tiredness 04/12/2013  . Alopecia 04/12/2013  . Loss of weight 04/12/2013  . Hot flushes, perimenopausal 04/12/2013  . Iron deficiency anemia 12/07/2012  . Left lumbar radiculitis 12/07/2012  . Routine general medical examination at a health care facility 12/07/2012  . Leukopenia 01/14/2012  . GERD 01/06/2012    Prior to Admission medications   Medication Sig Start Date End Date Taking? Authorizing Provider  buPROPion (WELLBUTRIN XL) 150 MG 24 hr tablet Take 1 tablet (150 mg total) by mouth daily. 02/15/14  Yes Heather M Marte, PA-C  clonazePAM (KLONOPIN) 0.5 MG tablet Take 0.5-1 tablets (0.25-0.5 mg total) by mouth at bedtime as needed for anxiety. 06/05/14  Yes Telesia Ates Tessa Lerner, PA-C  levonorgestrel (MIRENA) 20 MCG/24HR IUD 1 each by Intrauterine route once.     Yes Historical Provider, MD  mirtazapine (REMERON) 7.5 MG tablet Take 7.5 mg by mouth at bedtime.   Yes Historical Provider, MD  Multiple Vitamin (MULTIVITAMIN) tablet Take 1 tablet by mouth daily.     Yes Historical Provider, MD    Medical, Surgical, Family and Social History reviewed and updated.  HPI  Presents for medication refills. She was previously followed by Rhoderick Moody, PA-C who is no longer at this office. It is of interest that her PCP has not taken over this aspect of her care. Is starting the legal process to end her marriage, they have been separated for a year, but are just now getting legal advice. Feels sad, afraid, depressed, like she did when they initially separated. Has seen counselor, and was doing a lot better until they've been talking/negotiating  more. Until last week, she hadn't spoken to her husband in about 6 months., and since then she's had increased symptoms, including insomnia. Doesn't feel ready to stop the medication yet. Plans to contact her therapist again.  Is due for Mirena replacement-scheduled for February.  Review of Systems     Objective:   Physical Exam  Constitutional: She is oriented to person, place, and time. She appears well-developed and well-nourished. No distress.  BP 108/64 mmHg  Pulse 60  Temp(Src) 98.6 F (37 C)  Resp 16  Ht  (1.6 m)  Wt 131 lb (59.421 kg)  BMI 23.21 kg/m2  SpO2 99%   Eyes: Conjunctivae are normal. No scleral icterus.  Neck: Neck supple. No thyromegaly present.  Cardiovascular: Normal rate, regular rhythm, normal heart sounds and intact distal pulses.   Pulmonary/Chest: Effort normal and breath sounds normal.  Lymphadenopathy:    She has no cervical adenopathy.  Neurological: She is alert and oriented to person, place, and time.  Skin: Skin is warm and dry.  Psychiatric: She has a normal mood and affect. Her behavior is normal.          Assessment & Plan:  1. Depression 2. Insomnia Continue current treatment. Contact her therapist and resume talk-therapy. If her symptoms do not improve, would increase Wellbutrin XL to 300 mg. Discussed the need to use clonazepam as a rescue medication, as needed, not necessarily daily. If she's needing it daily, that's an indicator for the  need to increase the Wellbutrin dose. - buPROPion (WELLBUTRIN XL) 150 MG 24 hr tablet; Take 1 tablet (150 mg total) by mouth daily.  Dispense: 90 tablet; Refill: 1 - clonazePAM (KLONOPIN) 0.5 MG tablet; Take 0.5-1 tablets (0.25-0.5 mg total) by mouth at bedtime as needed for anxiety.  Dispense: 30 tablet; Refill: 0  Follow-up here in 6 months, if I will continue to refill these medications, sooner if needed.  Fernande Brashelle S. Urho Rio, PA-C Physician Assistant-Certified [facility]

## 2014-07-22 NOTE — Patient Instructions (Signed)
Reconnect with your counselor. Keep up the great work of taking care of yourself and your daughters.

## 2014-07-29 ENCOUNTER — Ambulatory Visit (INDEPENDENT_AMBULATORY_CARE_PROVIDER_SITE_OTHER): Payer: BLUE CROSS/BLUE SHIELD | Admitting: Family Medicine

## 2014-07-29 VITALS — BP 102/60 | HR 57 | Temp 98.1°F | Resp 16 | Ht 63.0 in | Wt 130.2 lb

## 2014-07-29 DIAGNOSIS — S161XXA Strain of muscle, fascia and tendon at neck level, initial encounter: Secondary | ICD-10-CM

## 2014-07-29 DIAGNOSIS — S29012A Strain of muscle and tendon of back wall of thorax, initial encounter: Secondary | ICD-10-CM

## 2014-07-29 MED ORDER — DICLOFENAC SODIUM 75 MG PO TBEC: 75.0000 mg | DELAYED_RELEASE_TABLET | Freq: Two times a day (BID) | ORAL | Status: DC

## 2014-07-29 MED ORDER — METAXALONE 800 MG PO TABS: ORAL_TABLET | ORAL | Status: DC

## 2014-07-29 NOTE — Patient Instructions (Signed)
Take diclofenac one twice daily for pain and inflammation. Take with breakfast and supper.  Take the muscle relaxant, metaxalone, one half pill in the morning, one half pill in the afternoon, and 1 at bedtime  Take Tylenol 2 tablets 3 times daily if needed for additional pain relief  Return if worse or not improving  Slow progression of activity at the gym after you have had your pain resolved.

## 2014-07-29 NOTE — Progress Notes (Signed)
Subjective: 44 year old lady who injured her upper back about a week ago. She does go to the gym some. 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 She does not know of any specific major injury, though she did feel a pop when she was doing a piece of exercise equipment. She has not had any motor vehicle accidents or major falls or other obvious injury. It is gradually continue to hurt her titer. She hurts in the low cervical upper thoracic region of her upper back and out across above the left scapula.  Objective: Alert and oriented. Neck has full range of motion. She is tender right on the dowagers hump area of the upper back. Mild tenderness across the top of the scapula in the lower trapezius. Full range of motion of her shoulder.  Assessment: Upper back and trapezius pain and strain  Plan: Skelaxin one half to one pill twice daily in the morning and afternoon and 1 at bedtime  (Diclofenac) one twice daily at breakfast and supper  Return if worse

## 2014-08-09 ENCOUNTER — Telehealth: Payer: Self-pay | Admitting: Gynecology

## 2014-08-09 ENCOUNTER — Other Ambulatory Visit: Payer: Self-pay | Admitting: Gynecology

## 2014-08-09 DIAGNOSIS — Z30431 Encounter for routine checking of intrauterine contraceptive device: Secondary | ICD-10-CM

## 2014-08-09 MED ORDER — LEVONORGESTREL 20 MCG/24HR IU IUD: INTRAUTERINE_SYSTEM | Freq: Once | INTRAUTERINE | Status: AC

## 2014-08-09 NOTE — Telephone Encounter (Signed)
08/09/14-LM VM for pt [facility] the replacement of her current Mirena IUD for contraception under the $20.00 copay.wl

## 2014-08-12 ENCOUNTER — Encounter: Payer: Self-pay | Admitting: Gynecology

## 2014-08-12 ENCOUNTER — Ambulatory Visit: Payer: BC Managed Care – PPO | Admitting: Gynecology

## 2014-08-12 ENCOUNTER — Ambulatory Visit (INDEPENDENT_AMBULATORY_CARE_PROVIDER_SITE_OTHER): Payer: BLUE CROSS/BLUE SHIELD | Admitting: Gynecology

## 2014-08-12 VITALS — BP 130/86

## 2014-08-12 DIAGNOSIS — Z975 Presence of (intrauterine) contraceptive device: Secondary | ICD-10-CM

## 2014-08-12 DIAGNOSIS — Z30433 Encounter for removal and reinsertion of intrauterine contraceptive device: Secondary | ICD-10-CM

## 2014-08-12 NOTE — Patient Instructions (Signed)
Colocacin de un dispositivo intrauterino - Cuidados posteriores (Intrauterine Device Insertion, Care After) Siga estas instrucciones durante las prximas semanas. Estas indicaciones le proporcionan informacin general acerca de cmo deber cuidarse despus del procedimiento. El mdico tambin podr darle instrucciones ms especficas. El tratamiento ha sido planificado segn las prcticas mdicas actuales, pero en algunos casos pueden ocurrir problemas. Comunquese con el mdico si tiene algn problema o tiene dudas despus del procedimiento. QU ESPERAR DESPUS DEL PROCEDIMIENTO La insercin del DIU puede causar molestias, como clicos. que deberan mejorar una vez que el DIU est en su lugar. Podr tener sangrado despus del procedimiento. Esto es normal. Vara desde un sangrado ligero durante un par de das hasta un sangrado similar al menstrual. Cuando el DIU est en su lugar, se extender un hilo de 1 a 2pulgadas (2,5 a 5cm) por el cuello del tero en la vagina. El hilo no debera molestarle a usted ni a su pareja. De lo contrario, consulte con su mdico.  INSTRUCCIONES PARA EL CUIDADO EN EL HOGAR   Controle su DIU para asegurarse de que est en su lugar, antes de reanudar la actividad sexual. Tiene que sentir los hilos. Si no los siente, algo puede estar mal. El DIU puede haberse salido del tero o ste puede haber sido atravesado (perforado) durante la colocacin. Adems, si los hilos son ms largos, puede significar que el DIU se est saliendo del tero. Si ocurre alguno de estos problemas, no estar protegida y podr quedar embarazada.  Puede volver a tener relaciones sexuales si no tiene problemas con el DIU. El DIU de cobre se considera efectivo y funciona de inmediato, si se inserta dentro de los 7 das del inicio del perodo. Ser necesario que utilice un mtodo anticonceptivo adicional durante 7 das, si el DIU se inserta en algn otro momento del ciclo.  Controle que el DIU sigue en su  lugar sintiendo los hilos despus de cada perodo menstrual.  Es posible que necesite tomar analgsicos, como acetaminofeno o ibuprofeno. Tome todos los medicamentos como le indic el mdico. SOLICITE ATENCIN MDICA SI:   Tiene un sangrado ms abundante o dura ms de un ciclo menstrual normal.  Tiene fiebre.  Siente clicos o dolor abdominal que no se alivian con medicamentos.  Siente dolor abdominal que no parece estar relacionado con el rea en que senta los clicos y el dolor anteriormente.  Se siente mareada, inusualmente dbil o se desmaya.  Tiene flujo vaginal u olores anormales.  Siente dolor durante las relaciones sexuales.  No puede sentir los hilos del DIU o los siente ms largos.  Siente que el DIU est en la abertura del cuello del tero, en la vagina.  Piensa que est embarazada o no tiene su perodo menstrual.  El hilo del DIU est lastimando a su pareja sexual. ASEGRESE DE QUE:  Comprende estas instrucciones.  Controlar su afeccin.  Recibir ayuda de inmediato si no mejora o si empeora. Document Released: 03/22/2012 Document Revised: 04/18/2013 Exit[employer] Patient Information 2015 [employer]. This information is not intended to replace advice given to you by your health care provider. Make sure you discuss any questions you have with your health care provider.  

## 2014-08-12 NOTE — Progress Notes (Signed)
   Patient presented to the office today for removal of expired Mirena IUD and insertion of a new one. Patient's had good success in the past.                                                                    IUD procedure note       Patient presented to the office today for placement of Mirena IUD. The patient had previously been provided with literature information on this method of contraception. The risks benefits and pros and cons were discussed and all her questions were answered. She is fully aware that this form of contraception is 99% effective and is good for 5 years.  Pelvic exam: Bartholin urethra Skene glands: Within normal limits Vagina: No lesions or discharge Cervix: No lesions or discharge, IUD string seen Uterus: Anteverted position Adnexa: No masses or tenderness Rectal exam: Not done  The IUD was grasped with a ring forcep and the IUD was retrieved shown to the patient and discarded.  The cervix was cleansed with Betadine solution. A single-tooth tenaculum was placed on the anterior cervical lip. The uterus sounded to 7 centimeter. The IUD was shown to the patient and inserted in a sterile fashion. The IUD string was trimmed. The single-tooth tenaculum was removed. Patient was instructed to return back to the office in one month for follow up.       Mirena IUD placed 08/12/2014 lot number [id number] expires in 5 years

## 2014-08-13 ENCOUNTER — Encounter: Payer: Self-pay | Admitting: Gynecology

## 2014-08-19 ENCOUNTER — Ambulatory Visit (INDEPENDENT_AMBULATORY_CARE_PROVIDER_SITE_OTHER): Payer: BLUE CROSS/BLUE SHIELD

## 2014-08-19 ENCOUNTER — Ambulatory Visit (INDEPENDENT_AMBULATORY_CARE_PROVIDER_SITE_OTHER): Payer: BLUE CROSS/BLUE SHIELD | Admitting: Family Medicine

## 2014-08-19 VITALS — BP 106/64 | HR 61 | Temp 98.5°F | Resp 16 | Ht 61.75 in | Wt 133.0 lb

## 2014-08-19 DIAGNOSIS — M7918 Myalgia, other site: Secondary | ICD-10-CM

## 2014-08-19 DIAGNOSIS — F32A Depression, unspecified: Secondary | ICD-10-CM

## 2014-08-19 DIAGNOSIS — M791 Myalgia: Secondary | ICD-10-CM

## 2014-08-19 DIAGNOSIS — S161XXA Strain of muscle, fascia and tendon at neck level, initial encounter: Secondary | ICD-10-CM

## 2014-08-19 DIAGNOSIS — F329 Major depressive disorder, single episode, unspecified: Secondary | ICD-10-CM

## 2014-08-19 DIAGNOSIS — S161XXD Strain of muscle, fascia and tendon at neck level, subsequent encounter: Secondary | ICD-10-CM

## 2014-08-19 DIAGNOSIS — S29012A Strain of muscle and tendon of back wall of thorax, initial encounter: Secondary | ICD-10-CM

## 2014-08-19 MED ORDER — METAXALONE 800 MG PO TABS: ORAL_TABLET | ORAL | Status: DC

## 2014-08-19 MED ORDER — CLONAZEPAM 0.5 MG PO TABS: 0.2500 mg | ORAL_TABLET | Freq: Every evening | ORAL | Status: DC | PRN

## 2014-08-19 MED ORDER — DICLOFENAC SODIUM 75 MG PO TBEC: 75.0000 mg | DELAYED_RELEASE_TABLET | Freq: Two times a day (BID) | ORAL | Status: DC

## 2014-08-19 NOTE — Patient Instructions (Signed)
I recommend keeping diclofenac OR ibuprofen on board - esp taking before and during work. Then when you come home, take a muscle relaxant followed by 15 minutes of heat followed by gentle stretching. Try to do this regiment 2 - 3 times a day.  If you are still having pain in 4 to 6 wks, come back to clinic for further eval. RTC immed if symptoms worsen or you develop any other concerning symptoms below.   Cervical Sprain A cervical sprain is an injury in the neck in which the strong, fibrous tissues (ligaments) that connect your neck bones stretch or tear. Cervical sprains can range from mild to severe. Severe cervical sprains can cause the neck vertebrae to be unstable. This can lead to damage of the spinal cord and can result in serious nervous system problems. The amount of time it takes for a cervical sprain to get better depends on the cause and extent of the injury. Most cervical sprains heal in 1 to 3 weeks. CAUSES  Severe cervical sprains may be caused by:   Contact sport injuries (such as from football, rugby, wrestling, hockey, auto racing, gymnastics, diving, martial arts, or boxing).   Motor vehicle collisions.   Whiplash injuries. This is an injury from a sudden forward and backward whipping movement of the head and neck.  Falls.  Mild cervical sprains may be caused by:   Being in an awkward position, such as while cradling a telephone between your ear and shoulder.   Sitting in a chair that does not offer proper support.   Working at a poorly Marketing executive station.   Looking up or down for long periods of time.  SYMPTOMS   Pain, soreness, stiffness, or a burning sensation in the front, back, or sides of the neck. This discomfort may develop immediately after the injury or slowly, 24 hours or more after the injury.   Pain or tenderness directly in the middle of the back of the neck.   Shoulder or upper back pain.   Limited ability to move the neck.    Headache.   Dizziness.   Weakness, numbness, or tingling in the hands or arms.   Muscle spasms.   Difficulty swallowing or chewing.   Tenderness and swelling of the neck.  DIAGNOSIS  Most of the time your health care provider can diagnose a cervical sprain by taking your history and doing a physical exam. Your health care provider will ask about previous neck injuries and any known neck problems, such as arthritis in the neck. X-rays may be taken to find out if there are any other problems, such as with the bones of the neck. Other tests, such as a CT scan or MRI, may also be needed.  TREATMENT  Treatment depends on the severity of the cervical sprain. Mild sprains can be treated with rest, keeping the neck in place (immobilization), and pain medicines. Severe cervical sprains are immediately immobilized. Further treatment is done to help with pain, muscle spasms, and other symptoms and may include:  Medicines, such as pain relievers, numbing medicines, or muscle relaxants.   Physical therapy. This may involve stretching exercises, strengthening exercises, and posture training. Exercises and improved posture can help stabilize the neck, strengthen muscles, and help stop symptoms from returning.  HOME CARE INSTRUCTIONS   Put ice on the injured area.   Put ice in a plastic bag.   Place a towel between your skin and the bag.   Leave the ice on for  15-20 minutes, 3-4 times a day.   If your injury was severe, you may have been given a cervical collar to wear. A cervical collar is a two-piece collar designed to keep your neck from moving while it heals.  Do not remove the collar unless instructed by your health care provider.  If you have long hair, keep it outside of the collar.  Ask your health care provider before making any adjustments to your collar. Minor adjustments may be required over time to improve comfort and reduce pressure on your chin or on the back of  your head.  Ifyou are allowed to remove the collar for cleaning or bathing, follow your health care provider's instructions on how to do so safely.  Keep your collar clean by wiping it with mild soap and water and drying it completely. If the collar you have been given includes removable pads, remove them every 1-2 days and hand wash them with soap and water. Allow them to air dry. They should be completely dry before you wear them in the collar.  If you are allowed to remove the collar for cleaning and bathing, wash and dry the skin of your neck. Check your skin for irritation or sores. If you see any, tell your health care provider.  Do not drive while wearing the collar.   Only take over-the-counter or prescription medicines for pain, discomfort, or fever as directed by your health care provider.   Keep all follow-up appointments as directed by your health care provider.   Keep all physical therapy appointments as directed by your health care provider.   Make any needed adjustments to your workstation to promote good posture.   Avoid positions and activities that make your symptoms worse.   Warm up and stretch before being active to help prevent problems.  SEEK MEDICAL CARE IF:   Your pain is not controlled with medicine.   You are unable to decrease your pain medicine over time as planned.   Your activity level is not improving as expected.  SEEK IMMEDIATE MEDICAL CARE IF:   You develop any bleeding.  You develop stomach upset.  You have signs of an allergic reaction to your medicine.   Your symptoms get worse.   You develop new, unexplained symptoms.   You have numbness, tingling, weakness, or paralysis in any part of your body.  MAKE SURE YOU:   Understand these instructions.  Will watch your condition.  Will get help right away if you are not doing well or get worse. Document Released: 04/25/2007 Document Revised: 07/03/2013 Document Reviewed:  01/03/2013 [facility] Patient Information 2015 [employer] [place]. This information is not intended to replace advice given to you by your health care provider. Make sure you discuss any questions you have with your health care provider.

## 2014-08-19 NOTE — Progress Notes (Addendum)
Subjective:  This chart was scribed for Norberto Sorenson, MD by Modena Jansky, ED Scribe. This patient was seen in room Room/bed info not found and the patient's care was started at 6:28 PM.   Patient ID: Christy Haley, female    DOB: 10-03-1970, 44 y.o.   MRN: [id number] Chief Complaint  Patient presents with  . Neck Pain    Seen on 07/29/2014 with Dr. Alwyn Ren  . Shoulder Pain  . Medication Refill    clonazepam   HPI  HPI Comments: Christy Haley is a 44 y.o. female who presents to the [facility] complaining of back pain that has been going for more than 2 weeks.   Seen 3 weeks ago. Appears around 1 month had an upper back injury working with weights at the gym. Followed with pain in upper neck and back pain radiating to the left scapula. Had some tenderness but normal otherwise. ROM strained. Was put on skelaxin and diclofenac with tylenol.   She reports that she was seen 2 weeks ago for back pain and given medication with some relief. She reports that she she injured her back and neck by lifting weights above her head at the gym. She states that pain has not been getting better, aside from temporary relief from medication. She reports that she is unable to state which medication provided more relief. She states that she has also been taking advil about 3-4 times a day with some relief.   She denies any prior back injury.   She states that she has intermittent moderate neck pain that radiates to her left deltoid.   She reports that she takes klonopin at night for anxiety to help her sleep. She states that her anxiety was onset due to a marriage separation.   Past Medical History  Diagnosis Date  . GERD (gastroesophageal reflux disease)   . Depression   . Anxiety    No Known Allergies Current Outpatient Prescriptions on File Prior to Visit  Medication Sig Dispense Refill  . buPROPion (WELLBUTRIN XL) 150 MG 24 hr tablet Take 1 tablet (150 mg total)  by mouth daily. 90 tablet 1  . calcium carbonate (OS-CAL) 600 MG TABS tablet Take 600 mg by mouth 2 (two) times daily with a meal.    . clonazePAM (KLONOPIN) 0.5 MG tablet Take 0.5-1 tablets (0.25-0.5 mg total) by mouth at bedtime as needed for anxiety. 30 tablet 0  . COLLAGEN PO Take by mouth.    . mirtazapine (REMERON) 7.5 MG tablet Take 7.5 mg by mouth at bedtime.    . Multiple Vitamin (MULTIVITAMIN) tablet Take 1 tablet by mouth daily.      . diclofenac (VOLTAREN) 75 MG EC tablet Take 1 tablet (75 mg total) by mouth 2 (two) times daily. (Patient not taking: Reported on 08/19/2014) 30 tablet 0  . metaxalone (SKELAXIN) 800 MG tablet Take one half to one pill in the morning and afternoon and a whole pill at bedtime for muscle relaxant (Patient not taking: Reported on 08/19/2014) 30 tablet 0   Current Facility-Administered Medications on File Prior to Visit  Medication Dose Route Frequency Provider Last Rate Last Dose  . levonorgestrel (MIRENA) 20 MCG/24HR IUD   Intrauterine Once Ok Edwards, MD        Review of Systems  Constitutional: Negative for fever and chills.  Gastrointestinal: Negative for nausea, vomiting, abdominal pain, diarrhea and constipation.  Genitourinary: Negative for flank pain.  Musculoskeletal: Positive for myalgias, back pain,  arthralgias, neck pain and neck stiffness. Negative for joint swelling and gait problem.  Skin: Negative for rash.  Neurological: Negative for dizziness, tremors, syncope, facial asymmetry, weakness, light-headedness, numbness and headaches.  Psychiatric/Behavioral: Positive for sleep disturbance. The patient is nervous/anxious.        Objective:   Physical Exam  Constitutional: She is oriented to person, place, and time. She appears well-developed and well-nourished. No distress.  HENT:  Head: Normocephalic and atraumatic.  Neck: Neck supple.  Cardiovascular: Normal rate, regular rhythm and normal heart sounds.   No murmur heard. +2  tricep bicep radialis pulse.   Pulmonary/Chest: Effort normal. No respiratory distress. She has no wheezes. She has no rales.  Musculoskeletal: Normal range of motion.  TTP over the C7 T1 spinous process. Negative spurling's test. Right paraspinals normal. Left paraspinals with palpable spasms radiating to upper rhomboid and left scapula. Cervical spine with full ROM with pain at extreme ends of ROM.   Neurological: She is alert and oriented to person, place, and time.  5/5 strength in tricep bicep flexion and extension.   Skin: Skin is warm and dry.  Psychiatric: She has a normal mood and affect. Her behavior is normal.  Nursing note and vitals reviewed.   BP 106/64 mmHg  Pulse 61  Temp(Src) 98.5 F (36.9 C) (Oral)  Resp 16  Ht 5' 1.75" (1.568 m)  Wt 133 lb (60.328 kg)  BMI 24.54 kg/m2  SpO2 98%   [facility] reading (PRIMARY) by  Dr. Clelia CroftShaw. Cervical spine:  Some mild degenerative change throughout all cervical vertebra, no acute abnormality around C6-7-T1 to explain pt's pain. Assessment & Plan:   Cervical strain, subsequent encounter - Plan: DG Cervical Spine 2 or 3 views - reviewed various therapeutic methods for pain such as heat, ice, and massage. Showed pt sev rhomboid and trapezius stretches - try tennis ball and foam roller after heat. Advise pt of PT or chiropractic options for treatment which may be beneficial as she has failed initial medication management but fortunately does not have any concerns for neurologic involvement at this time. If pain worsen or is still present in 1 mo, RTC for further eval. Meds refilled.  Rhomboid myalgia  Depression - Plan: clonazePAM (KLONOPIN) 0.5 MG tablet - Reviewed klonopin use issues such as dependence and withdrawal with pt - has started this along w/ qam wellbutrin 1 yr prev to help with anxiety/panic during divorce - still dealing with divorce - hopes that after all is resolved she will be able to wean off as no anxiety/sleep issues prior.   Refilled klonopin x 6 mos - needs f/u OV for any additional refills.  Strain of neck muscle, initial encounter - Plan: diclofenac (VOLTAREN) 75 MG EC tablet, metaxalone (SKELAXIN) 800 MG tablet  Muscle strain of left upper back, initial encounter - Plan: diclofenac (VOLTAREN) 75 MG EC tablet, metaxalone (SKELAXIN) 800 MG tablet  Meds ordered this encounter  Medications  . clonazePAM (KLONOPIN) 0.5 MG tablet    Sig: Take 0.5-1 tablets (0.25-0.5 mg total) by mouth at bedtime as needed for anxiety.    Dispense:  30 tablet    Refill:  5  . diclofenac (VOLTAREN) 75 MG EC tablet    Sig: Take 1 tablet (75 mg total) by mouth 2 (two) times daily.    Dispense:  60 tablet    Refill:  0  . metaxalone (SKELAXIN) 800 MG tablet    Sig: Take one half to one pill in the morning and afternoon  for muscle relaxant    Dispense:  60 tablet    Refill:  1    I personally performed the services described in this documentation, which was scribed in my presence. The recorded information has been reviewed and considered, and addended by me as needed.  Norberto Sorenson, MD MPH

## 2014-09-11 ENCOUNTER — Ambulatory Visit (INDEPENDENT_AMBULATORY_CARE_PROVIDER_SITE_OTHER): Payer: BLUE CROSS/BLUE SHIELD | Admitting: Gynecology

## 2014-09-11 ENCOUNTER — Encounter: Payer: Self-pay | Admitting: Gynecology

## 2014-09-11 VITALS — BP 116/70

## 2014-09-11 DIAGNOSIS — Z30431 Encounter for routine checking of intrauterine contraceptive device: Secondary | ICD-10-CM

## 2014-09-11 NOTE — Progress Notes (Signed)
   Patient is a 44 year old gravida 3 para 2 AB 1 who presented to the office today for 1 month follow-up after having placed the Mirena IUD. She is asymptomatic today and has done well since the placement of the Mirena IUD.  Exam: Blood pressure 116/70 Gen. appearance: Well-developed well-nourished female no acute distress Abdomen: Soft nontender no rebound or guarding Pelvic: Bartholin urethra Skene was within normal limits Vagina: No lesions or discharge Cervix: No lesions or discharge IUD string was seen Bimanual exam: Uterus was anteverted normal size shape and consistency Adnexa: No palpable masses or tenderness Rectal exam not done  Assessment 4/plan: Patient status post placement of Mirena IUD one month ago doing well. Patient scheduled to return at the end of the year for her annual gynecological exam or when necessary.

## 2014-09-11 NOTE — Addendum Note (Signed)
Addended by: Ok EdwardsFERNANDEZ, Elieser Tetrick H on: 09/11/2014 10:05 AM   Modules accepted: Level of Service

## 2015-02-19 ENCOUNTER — Telehealth: Payer: Self-pay

## 2015-02-19 NOTE — Telephone Encounter (Signed)
Patient called in stating that she needs her clonazePAM (KLONOPIN) 0.5 MG tablet refilled, she stated that her pharmacy had sent [place] a fax on 02/17/15 to get a preauth for this med.  Her call back number is [phone].

## 2015-02-20 ENCOUNTER — Ambulatory Visit (INDEPENDENT_AMBULATORY_CARE_PROVIDER_SITE_OTHER): Payer: BLUE CROSS/BLUE SHIELD | Admitting: Emergency Medicine

## 2015-02-20 VITALS — BP 106/74 | HR 69 | Temp 98.5°F | Resp 16 | Ht 61.75 in | Wt 128.8 lb

## 2015-02-20 DIAGNOSIS — F329 Major depressive disorder, single episode, unspecified: Secondary | ICD-10-CM | POA: Diagnosis not present

## 2015-02-20 DIAGNOSIS — F411 Generalized anxiety disorder: Secondary | ICD-10-CM | POA: Diagnosis not present

## 2015-02-20 DIAGNOSIS — F32A Depression, unspecified: Secondary | ICD-10-CM

## 2015-02-20 MED ORDER — CLONAZEPAM 0.5 MG PO TABS: 0.2500 mg | ORAL_TABLET | Freq: Every evening | ORAL | Status: DC | PRN

## 2015-02-20 NOTE — Telephone Encounter (Signed)
I last saw pt 6 mos prev and noted that she will need an OV for additional refills on the controlled medication.  Depression - Plan: clonazePAM (KLONOPIN) 0.5 MG tablet - Reviewed klonopin use issues such as dependence and withdrawal with pt - has started this along w/ qam wellbutrin 1 yr prev to help with anxiety/panic during divorce - still dealing with divorce - hopes that after all is resolved she will be able to wean off as no anxiety/sleep issues prior. Refilled klonopin x 6 mos - needs f/u OV for any additional refills.

## 2015-02-20 NOTE — Progress Notes (Signed)
Subjective:  Patient ID: Christy Haley, female    DOB: 07-Mar-1971  Age: 44 y.o. MRN: [id number]  CC: Medication Refill   HPI Sherlon Nied presents    Needing  A refill for clonazepam for anxiety. She said that her symptoms of been well-controlled and she's tolerating medication well usually sees Dr. Clelia Croft or  Mrs.Tinnie Gens.  History Rosabell has a past medical history of GERD (gastroesophageal reflux disease); Depression; and Anxiety.   She has past surgical history that includes Pelvic laparoscopy and Intrauterine device insertion (2010).   Her  family history includes Arthritis in her mother; Diabetes in her maternal grandmother; Hypertension in her mother. There is no history of Heart disease, Hyperlipidemia, Kidney disease, Stroke, Alcohol abuse, Cancer, COPD, or Early death.  She   reports that she has never smoked. She has never used smokeless tobacco. She reports that she does not drink alcohol or use illicit drugs.  Outpatient Prescriptions Prior to Visit  Medication Sig Dispense Refill  . buPROPion (WELLBUTRIN XL) 150 MG 24 hr tablet Take 1 tablet (150 mg total) by mouth daily. 90 tablet 1  . calcium carbonate (OS-CAL) 600 MG TABS tablet Take 600 mg by mouth 2 (two) times daily with a meal.    . COLLAGEN PO Take by mouth.    . Multiple Vitamin (MULTIVITAMIN) tablet Take 1 tablet by mouth daily.      . clonazePAM (KLONOPIN) 0.5 MG tablet Take 0.5-1 tablets (0.25-0.5 mg total) by mouth at bedtime as needed for anxiety. 30 tablet 5  . diclofenac (VOLTAREN) 75 MG EC tablet Take 1 tablet (75 mg total) by mouth 2 (two) times daily. (Patient not taking: Reported on 02/20/2015) 60 tablet 0  . metaxalone (SKELAXIN) 800 MG tablet Take one half to one pill in the morning and afternoon  for muscle relaxant (Patient not taking: Reported on 02/20/2015) 60 tablet 1  . mirtazapine (REMERON) 7.5 MG tablet Take 7.5 mg by mouth at bedtime.     Facility-Administered  Medications Prior to Visit  Medication Dose Route Frequency Provider Last Rate Last Dose  . levonorgestrel (MIRENA) 20 MCG/24HR IUD   Intrauterine Once Ok Edwards, MD        Social History   Social History  . Marital Status: Married    Spouse Name: separated  . Number of Children: 2  . Years of Education: 12th grade   Occupational History  . Arts administrator     Social History Main Topics  . Smoking status: Never Smoker   . Smokeless tobacco: Never Used  . Alcohol Use: No  . Drug Use: No  . Sexual Activity: Yes    Birth Control/ Protection: IUD     Comment: Mirena IUD 03/31/2009   Other Topics Concern  . None   Social History Narrative   [place]   Her older daughter was born in [place] and is in college at [employer].   Her younger daughter lives at home with her.   Separated from her husband 07/2013.     Review of Systems  Constitutional: Negative for fever, chills and appetite change.  HENT: Negative for congestion, ear pain, postnasal drip, sinus pressure and sore throat.   Eyes: Negative for pain and redness.  Respiratory: Negative for cough, shortness of breath and wheezing.   Cardiovascular: Negative for leg swelling.  Gastrointestinal: Negative for nausea, vomiting, abdominal pain, diarrhea, constipation and blood in stool.  Endocrine: Negative for polyuria.  Genitourinary: Negative for dysuria,  urgency, frequency and flank pain.  Musculoskeletal: Negative for gait problem.  Skin: Negative for rash.  Neurological: Negative for weakness and headaches.  Psychiatric/Behavioral: Negative for confusion and decreased concentration. The patient is not nervous/anxious.     Objective:  BP 106/74 mmHg  Pulse 69  Temp(Src) 98.5 F (36.9 C) (Oral)  Resp 16  Ht 5' 1.75" (1.568 m)  Wt 128 lb 12.8 oz (58.423 kg)  BMI 23.76 kg/m2  SpO2 99%  Physical Exam  Constitutional: She is oriented to person, place, and time. She appears  well-developed and well-nourished.  HENT:  Head: Normocephalic and atraumatic.  Eyes: Conjunctivae are normal. Pupils are equal, round, and reactive to light.  Pulmonary/Chest: Effort normal.  Musculoskeletal: She exhibits no edema.  Neurological: She is alert and oriented to person, place, and time.  Skin: Skin is dry.  Psychiatric: She has a normal mood and affect. Her behavior is normal. Thought content normal.      Assessment & Plan:   Ellyson was seen today for medication refill.  Diagnoses and all orders for this visit:  Generalized anxiety disorder  Depression -     clonazePAM (KLONOPIN) 0.5 MG tablet; Take 0.5-1 tablets (0.25-0.5 mg total) by mouth at bedtime as needed for anxiety.   I am having Ms. Didio-Rodriguez maintain her multivitamin, mirtazapine, buPROPion, COLLAGEN PO, calcium carbonate, diclofenac, metaxalone, and clonazePAM. We will continue to administer levonorgestrel.  Meds ordered this encounter  Medications  . clonazePAM (KLONOPIN) 0.5 MG tablet    Sig: Take 0.5-1 tablets (0.25-0.5 mg total) by mouth at bedtime as needed for anxiety.    Dispense:  30 tablet    Refill:  0     She was reminded of the office  controlled substance policy that she'll follow-up Dr.  Clelia Croft  Appropriate red flag conditions were discussed with the patient as well as actions that should be taken.  Patient expressed his understanding.  Follow-up: Return in about 1 month (around 03/23/2015).  Carmelina Dane, MD

## 2015-02-20 NOTE — Telephone Encounter (Signed)
Left message to RTC for additional refills. 

## 2015-02-20 NOTE — Patient Instructions (Signed)
Generalized Anxiety Disorder Generalized anxiety disorder (GAD) is a mental disorder. It interferes with life functions, including relationships, work, and school. GAD is different from normal anxiety, which everyone experiences at some point in their lives in response to specific life events and activities. Normal anxiety actually helps us prepare for and get through these life events and activities. Normal anxiety goes away after the event or activity is over.  GAD causes anxiety that is not necessarily related to specific events or activities. It also causes excess anxiety in proportion to specific events or activities. The anxiety associated with GAD is also difficult to control. GAD can vary from mild to severe. People with severe GAD can have intense waves of anxiety with physical symptoms (panic attacks).  SYMPTOMS The anxiety and worry associated with GAD are difficult to control. This anxiety and worry are related to many life events and activities and also occur more days than not for 6 months or longer. People with GAD also have three or more of the following symptoms (one or more in children):  Restlessness.   Fatigue.  Difficulty concentrating.   Irritability.  Muscle tension.  Difficulty sleeping or unsatisfying sleep. DIAGNOSIS GAD is diagnosed through an assessment by your health care provider. Your health care provider will ask you questions aboutyour mood,physical symptoms, and events in your life. Your health care provider may ask you about your medical history and use of alcohol or drugs, including prescription medicines. Your health care provider may also do a physical exam and blood tests. Certain medical conditions and the use of certain substances can cause symptoms similar to those associated with GAD. Your health care provider may refer you to a mental health specialist for further evaluation. TREATMENT The following therapies are usually used to treat GAD:    Medication. Antidepressant medication usually is prescribed for long-term daily control. Antianxiety medicines may be added in severe cases, especially when panic attacks occur.   Talk therapy (psychotherapy). Certain types of talk therapy can be helpful in treating GAD by providing support, education, and guidance. A form of talk therapy called cognitive behavioral therapy can teach you healthy ways to think about and react to daily life events and activities.  Stress managementtechniques. These include yoga, meditation, and exercise and can be very helpful when they are practiced regularly. A mental health specialist can help determine which treatment is best for you. Some people see improvement with one therapy. However, other people require a combination of therapies. Document Released: 10/23/2012 Document Revised: 11/12/2013 Document Reviewed: 10/23/2012 ExitCare Patient Information 2015 [employer]. This information is not intended to replace advice given to you by your health care provider. Make sure you discuss any questions you have with your health care provider.  

## 2015-03-24 ENCOUNTER — Ambulatory Visit (INDEPENDENT_AMBULATORY_CARE_PROVIDER_SITE_OTHER): Payer: BLUE CROSS/BLUE SHIELD | Admitting: Physician Assistant

## 2015-03-24 VITALS — BP 110/66 | HR 53 | Temp 98.3°F | Resp 20 | Ht 62.0 in | Wt 126.5 lb

## 2015-03-24 DIAGNOSIS — F411 Generalized anxiety disorder: Secondary | ICD-10-CM | POA: Diagnosis not present

## 2015-03-24 DIAGNOSIS — Z23 Encounter for immunization: Secondary | ICD-10-CM

## 2015-03-24 DIAGNOSIS — F329 Major depressive disorder, single episode, unspecified: Secondary | ICD-10-CM | POA: Diagnosis not present

## 2015-03-24 DIAGNOSIS — G4700 Insomnia, unspecified: Secondary | ICD-10-CM

## 2015-03-24 DIAGNOSIS — F32A Depression, unspecified: Secondary | ICD-10-CM

## 2015-03-24 MED ORDER — BUPROPION HCL ER (XL) 300 MG PO TB24: 300.0000 mg | ORAL_TABLET | Freq: Every day | ORAL | Status: DC

## 2015-03-24 MED ORDER — CLONAZEPAM 0.5 MG PO TABS
0.2500 mg | ORAL_TABLET | Freq: Every evening | ORAL | Status: DC | PRN
Start: 2015-03-24 — End: 2015-05-05

## 2015-03-24 NOTE — Patient Instructions (Signed)

## 2015-03-24 NOTE — Progress Notes (Signed)
Patient ID: Christy Haley, female    DOB: April 20, 1971, 44 y.o.   MRN: [id number]  PCP: Willow Ora, MD  Subjective:   Chief Complaint  Patient presents with  . Medication Refill  . Depression    Per Triage Screen-Already being treated for Depression    HPI Presents for medication refill.  I last saw her in January.  She presents for medication refill: Clonazepam.  In January of this year she and her husband began legal divorce proceedings. They had been separated for some time, and once the legal negotiations began, her symptoms of anxiety and depression with insomnia worsened considerably.  She reports that she continues to see a therapist, and that while overall things feel pretty stable and are improving, the legal proceedings have stalled.  Several providers, including me, have expressed concern regarding chronic use of clonazepam. She has tried to stop it, but was completely unable to sleep. She notes that long before the situational stress with her ex-husband, she had trouble sleeping. OTC products were initially helpful, but quickly lost effectiveness. She reports lying in bed at night with her mind racing through the things she needs to do and unable to turn off worrying about "everything."  Her 62 year old daughter lives with her. Her 4 year-old daughter is experiencing an episode of depression. She has always been quiet and isolates herself. This episode began 3 years ago, but she didn't tell her mother until last night, when she told the patient that she wasn't going on the study abroad semester in [place], for which she is scheduled to leave next week.   Previously took Remeron, but stopped it after several months because she felt like other things were working adequately. Hasn't seen Dr. Drue Novel in quite some time, though he is still listed as her PCP. She has been coming to this clinic for treatment of anxiety and depression, even while she was seeing Dr. Drue Novel for health  maintenance.   Review of Systems  Constitutional: Positive for fatigue. Negative for fever, chills, diaphoresis and unexpected weight change.  Eyes: Negative for visual disturbance.  Respiratory: Negative for cough, choking, chest tightness and shortness of breath.   Cardiovascular: Negative for chest pain, palpitations and leg swelling.  Gastrointestinal: Negative for nausea, vomiting, abdominal pain, diarrhea and constipation.  Neurological: Negative.   Psychiatric/Behavioral: Positive for sleep disturbance and dysphoric mood. Negative for suicidal ideas, hallucinations, behavioral problems, confusion, self-injury, decreased concentration and agitation. The patient is nervous/anxious. The patient is not hyperactive.        Patient Active Problem List   Diagnosis Date Noted  . Generalized anxiety disorder 02/20/2015  . IUD (intrauterine device) in place 08/12/2014  . Cervical lesion 06/21/2014  . Hyperglycemia 04/24/2013  . Muscle tiredness 04/12/2013  . Alopecia 04/12/2013  . Loss of weight 04/12/2013  . Hot flushes, perimenopausal 04/12/2013  . Iron deficiency anemia 12/07/2012  . Left lumbar radiculitis 12/07/2012  . Routine general medical examination at a health care facility 12/07/2012  . Leukopenia 01/14/2012  . GERD 01/06/2012     Prior to Admission medications   Medication Sig Start Date End Date Taking? Authorizing Provider  buPROPion (WELLBUTRIN XL) 150 MG 24 hr tablet Take 1 tablet (150 mg total) by mouth daily. 07/22/14  Yes Kenisha Lynds, PA-C  calcium carbonate (OS-CAL) 600 MG TABS tablet Take 600 mg by mouth 2 (two) times daily with a meal.   Yes Historical Provider, MD  clonazePAM (KLONOPIN) 0.5 MG tablet Take 0.5-1 tablets (0.25-0.5  mg total) by mouth at bedtime as needed for anxiety. 02/20/15  Yes Carmelina Dane, MD  COLLAGEN PO Take by mouth.   Yes Historical Provider, MD  Multiple Vitamin (MULTIVITAMIN) tablet Take 1 tablet by mouth daily.     Yes  Historical Provider, MD     No Known Allergies     Objective:  Physical Exam  Constitutional: She is oriented to person, place, and time. Vital signs are normal. She appears well-developed and well-nourished. She is active and cooperative. No distress.  BP 110/66 mmHg  Pulse 53  Temp(Src) 98.3 F (36.8 C) (Oral)  Resp 20  Ht  (1.575 m)  Wt 126 lb 8 oz (57.38 kg)  BMI 23.13 kg/m2  SpO2 99%  HENT:  Head: Normocephalic and atraumatic.  Right Ear: Hearing normal.  Left Ear: Hearing normal.  Eyes: Conjunctivae are normal. No scleral icterus.  Neck: Normal range of motion. Neck supple. No thyromegaly present.  Cardiovascular: Normal rate, regular rhythm and normal heart sounds.   Pulses:      Radial pulses are 2+ on the right side, and 2+ on the left side.  Pulmonary/Chest: Effort normal and breath sounds normal.  Lymphadenopathy:       Head (right side): No tonsillar, no preauricular, no posterior auricular and no occipital adenopathy present.       Head (left side): No tonsillar, no preauricular, no posterior auricular and no occipital adenopathy present.    She has no cervical adenopathy.       Right: No supraclavicular adenopathy present.       Left: No supraclavicular adenopathy present.  Neurological: She is alert and oriented to person, place, and time. No sensory deficit.  Skin: Skin is warm, dry and intact. No rash noted. No cyanosis or erythema. Nails show no clubbing.  Psychiatric: She has a normal mood and affect. Her speech is normal and behavior is normal.           Assessment & Plan:   1. Depression 2. Generalized anxiety disorder 3. Insomnia Increase bupropion to 300 mg. Continue clonazepam. Continue psychotherapy. Counseled on the concerns associated with long-term benzodiazepine use, and our goal to manage her symptoms with safer choices that minimize her need for the controlled substance. Consider restarting Remeron if symptoms persist.  -  buPROPion (WELLBUTRIN XL) 300 MG 24 hr tablet; Take 1 tablet (300 mg total) by mouth daily.  Dispense: 90 tablet; Refill: 3 - clonazePAM (KLONOPIN) 0.5 MG tablet; Take 0.5-1 tablets (0.25-0.5 mg total) by mouth at bedtime as needed for anxiety.  Dispense: 30 tablet; Refill: 0  4. Need for influenza vaccination - Flu Vaccine QUAD 36+ mos IM   Return in about 4 weeks (around 04/21/2015) for re-evaluation of sleep and mood.   Fernande Bras, PA-C Physician Assistant-Certified [facility]

## 2015-05-05 ENCOUNTER — Ambulatory Visit (INDEPENDENT_AMBULATORY_CARE_PROVIDER_SITE_OTHER): Payer: BLUE CROSS/BLUE SHIELD | Admitting: Physician Assistant

## 2015-05-05 ENCOUNTER — Ambulatory Visit (INDEPENDENT_AMBULATORY_CARE_PROVIDER_SITE_OTHER): Payer: BLUE CROSS/BLUE SHIELD

## 2015-05-05 VITALS — BP 114/76 | HR 61 | Temp 98.4°F | Resp 16 | Ht 62.0 in | Wt 128.2 lb

## 2015-05-05 DIAGNOSIS — F329 Major depressive disorder, single episode, unspecified: Secondary | ICD-10-CM

## 2015-05-05 DIAGNOSIS — M25561 Pain in right knee: Secondary | ICD-10-CM | POA: Diagnosis not present

## 2015-05-05 DIAGNOSIS — G4700 Insomnia, unspecified: Secondary | ICD-10-CM | POA: Diagnosis not present

## 2015-05-05 DIAGNOSIS — F411 Generalized anxiety disorder: Secondary | ICD-10-CM

## 2015-05-05 DIAGNOSIS — F32A Depression, unspecified: Secondary | ICD-10-CM

## 2015-05-05 MED ORDER — CLONAZEPAM 0.5 MG PO TABS: 0.2500 mg | ORAL_TABLET | Freq: Every evening | ORAL | Status: DC | PRN

## 2015-05-05 NOTE — Progress Notes (Signed)
Patient ID: Christy Haley, female    DOB: [birth year], 44 y.o.   MRN: [id number]  PCP: Olene FlossJEFFERY,Jess Toney, PA-C  Subjective:   Chief Complaint  Patient presents with  . Medication Refill    refill of clonazepam  . Knee Injury    right knee pain/swelling--happened yesterday    HPI Presents for evaluation of anxiety and depression. In addition, she injured her knee yesterday.  At her last visit we increased the Wellbutrin XL from 150 to 300 mg. Initially, she had some side effects and felt weird, but after a week, she became accustomed to the dose.  Is using 1/2-1 clonazepam tablet at HS, depending on how her mood is, whether or not her mind is racing. There are things that are bringing her down, like an argument with her estranged husband. Hopes to get off the medications.  Her older daughter is now in counseling, living at home, being treated for ADD and anxiety. She plans to go back to [employer] next semester, but isn't sure whether or not she will be able to do the study abroad program another time. Younger daughter is doing well.  Larey SeatFell on a bike ride yesterday. Skinned the RIGHT knee. Has had problems with popping in this knee previously. History of knee swelling. Saw ortho before, who told her that she wasn't ready for a knee replacement. Tries to stay active.  Review of Systems  Constitutional: Negative for fever, chills, activity change, appetite change, fatigue and unexpected weight change.  HENT: Negative for congestion, dental problem, ear pain, hearing loss, mouth sores, postnasal drip, rhinorrhea, sneezing, sore throat, tinnitus and trouble swallowing.   Eyes: Negative for photophobia, pain, redness and visual disturbance.  Respiratory: Negative for cough, chest tightness and shortness of breath.   Cardiovascular: Negative for chest pain, palpitations and leg swelling.  Gastrointestinal: Negative for nausea, vomiting, abdominal pain, diarrhea, constipation and blood in  stool.  Genitourinary: Negative for dysuria, urgency, frequency and hematuria.  Musculoskeletal: Positive for arthralgias (RIGHT knee). Negative for myalgias, gait problem and neck stiffness.  Skin: Negative for rash.  Neurological: Negative for dizziness, speech difficulty, weakness, light-headedness, numbness and headaches.  Hematological: Negative for adenopathy.  Psychiatric/Behavioral: Positive for sleep disturbance. Negative for suicidal ideas, confusion, self-injury, dysphoric mood and decreased concentration. The patient is not nervous/anxious.    [facility] 05/05/2015 03/24/2015 02/20/2015  Decreased Interest 0 0 0  Down, Depressed, Hopeless 0 2 0  PHQ - 2 Score 0 2 0  Altered sleeping - 0 -  Tired, decreased energy - 2 -  Change in appetite - 2 -  Feeling bad or failure about yourself  - 0 -  Trouble concentrating - 0 -  Moving slowly or fidgety/restless - 0 -  Suicidal thoughts - 0 -  PHQ-9 Score - 6 -  Difficult doing work/chores - Not difficult at all -        Patient Active Problem List   Diagnosis Date Noted  . Generalized anxiety disorder 02/20/2015  . IUD (intrauterine device) in place 08/12/2014  . Cervical lesion 06/21/2014  . Hyperglycemia 04/24/2013  . Muscle tiredness 04/12/2013  . Alopecia 04/12/2013  . Loss of weight 04/12/2013  . Hot flushes, perimenopausal 04/12/2013  . Iron deficiency anemia 12/07/2012  . Left lumbar radiculitis 12/07/2012  . Routine general medical examination at a health care facility 12/07/2012  . Leukopenia 01/14/2012  . GERD 01/06/2012     Prior to Admission medications   Medication Sig Start Date End  Date Taking? Authorizing Provider  buPROPion (WELLBUTRIN XL) 300 MG 24 hr tablet Take 1 tablet (300 mg total) by mouth daily. 03/24/15  Yes Michaiah Holsopple, PA-C  calcium carbonate (OS-CAL) 600 MG TABS tablet Take 600 mg by mouth 2 (two) times daily with a meal.   Yes Historical Provider, MD  clonazePAM  (KLONOPIN) 0.5 MG tablet Take 0.5-1 tablets (0.25-0.5 mg total) by mouth at bedtime as needed for anxiety. 03/24/15  Yes Jadiel Schmieder, PA-C  COLLAGEN PO Take by mouth.   Yes Historical Provider, MD  Multiple Vitamin (MULTIVITAMIN) tablet Take 1 tablet by mouth daily.     Yes Historical Provider, MD     No Known Allergies     Objective:  Physical Exam  Constitutional: She is oriented to person, place, and time. Vital signs are normal. She appears well-developed and well-nourished. She is active and cooperative. No distress.  BP 114/76 mmHg  Pulse 61  Temp(Src) 98.4 F (36.9 C) (Oral)  Resp 16  Ht  (1.575 m)  Wt 128 lb 4 oz (58.174 kg)  BMI 23.45 kg/m2  SpO2 98%  HENT:  Head: Normocephalic and atraumatic.  Right Ear: Hearing normal.  Left Ear: Hearing normal.  Eyes: Conjunctivae are normal. No scleral icterus.  Neck: Normal range of motion. Neck supple. No thyromegaly present.  Cardiovascular: Normal rate, regular rhythm and normal heart sounds.   Pulses:      Radial pulses are 2+ on the right side, and 2+ on the left side.  Pulmonary/Chest: Effort normal and breath sounds normal.  Musculoskeletal:       Right knee: She exhibits swelling and ecchymosis. She exhibits normal range of motion, no effusion, no deformity, no erythema, normal alignment, no LCL laxity, normal patellar mobility, no bony tenderness, normal meniscus and no MCL laxity. Lacerations: abrasion, inferior to the patella. No tenderness found.       Legs: Lymphadenopathy:       Head (right side): No tonsillar, no preauricular, no posterior auricular and no occipital adenopathy present.       Head (left side): No tonsillar, no preauricular, no posterior auricular and no occipital adenopathy present.    She has no cervical adenopathy.       Right: No supraclavicular adenopathy present.       Left: No supraclavicular adenopathy present.  Neurological: She is alert and oriented to person, place, and time. No  sensory deficit.  Skin: Skin is warm, dry and intact. No rash noted. No cyanosis or erythema. Nails show no clubbing.  Psychiatric: She has a normal mood and affect. Her speech is normal and behavior is normal. Cognition and memory are not impaired. She does not express impulsivity or inappropriate judgment. She expresses no homicidal and no suicidal ideation.       RIGHT Knee: [facility] reading (PRIMARY) by  Dr. Merla Riches. Essentially normal knee. Possible mild narrowing of the medial joint space.      Assessment & Plan:   1. Right knee pain Contusion, abrasion. OTC NSAIDS. Anticipatory guidance. Rest. Ice. - DG Knee Complete 4 Views Right; Future  2. Depression 3. Generalized anxiety disorder 4. Insomnia Stable/improving. Continue Wellbutrin XL 300 mg. Continue working on reducing dose and frequency of alprazolam. - clonazePAM (KLONOPIN) 0.5 MG tablet; Take 0.5-1 tablets (0.25-0.5 mg total) by mouth at bedtime as needed for anxiety.  Dispense: 30 tablet; Refill: 0   Return in about 2 months (around 07/05/2015).   Fernande Bras, PA-C Physician Assistant-Certified [facility]

## 2015-05-05 NOTE — Patient Instructions (Signed)
Use ice and rest to support your knee healing.

## 2015-05-19 ENCOUNTER — Other Ambulatory Visit: Payer: Self-pay

## 2015-05-19 DIAGNOSIS — Z1231 Encounter for screening mammogram for malignant neoplasm of breast: Secondary | ICD-10-CM

## 2015-06-10 ENCOUNTER — Other Ambulatory Visit: Payer: Self-pay

## 2015-06-10 DIAGNOSIS — F329 Major depressive disorder, single episode, unspecified: Secondary | ICD-10-CM

## 2015-06-10 DIAGNOSIS — F32A Depression, unspecified: Secondary | ICD-10-CM

## 2015-06-10 MED ORDER — CLONAZEPAM 0.5 MG PO TABS: 0.2500 mg | ORAL_TABLET | Freq: Every evening | ORAL | Status: DC | PRN

## 2015-06-10 NOTE — Telephone Encounter (Signed)
Rx printed at 104. Will bring to 102 after clinic.  Meds ordered this encounter  Medications  . clonazePAM (KLONOPIN) 0.5 MG tablet    Sig: Take 0.5-1 tablets (0.25-0.5 mg total) by mouth at bedtime as needed for anxiety.    Dispense:  30 tablet    Refill:  0    Order Specific Question:  Supervising Provider    Answer:  DOOLITTLE, ROBERT P [[id number]]

## 2015-06-10 NOTE — Telephone Encounter (Signed)
Pt requesting refill on clonazapam   [facility] phone [phone]

## 2015-06-11 NOTE — Telephone Encounter (Signed)
Faxed

## 2015-06-19 ENCOUNTER — Ambulatory Visit (INDEPENDENT_AMBULATORY_CARE_PROVIDER_SITE_OTHER): Payer: BLUE CROSS/BLUE SHIELD | Admitting: Gynecology

## 2015-06-19 ENCOUNTER — Encounter: Payer: Self-pay | Admitting: Gynecology

## 2015-06-19 VITALS — BP 124/78 | Ht 62.0 in | Wt 127.0 lb

## 2015-06-19 DIAGNOSIS — Z01419 Encounter for gynecological examination (general) (routine) without abnormal findings: Secondary | ICD-10-CM | POA: Diagnosis not present

## 2015-06-19 NOTE — Progress Notes (Signed)
Christy Haley [birth year] [id number]   History:    44 y.o.  for annual gyn exam with no complaints today. She had a new Mirena IUD placed last year and is having no problems and no menstrual cycles reported. Her PCP is been doing her blood work. Her flu vaccine is up-to-date. She is due for mammogram next few weeks. Patient with no past history of abnormal Pap smear. In 2015 she underwent a cervical biopsy and colposcopy as a result of an abnormality seen on the ectocervix and the pathology report was benign nabothian cysts.  Past medical history,surgical history, family history and social history were all reviewed and documented in the EPIC chart.  Gynecologic History No LMP recorded. Patient is not currently having periods (Reason: IUD). Contraception: IUD Last Pap: 2015. Results were: normal Last mammogram: 2015. Results were: Normal three-dimensional mammogram due to dense breasts  Obstetric History OB History  Gravida Para Term Preterm AB SAB TAB Ectopic Multiple Living  # Outcome Date GA Lbr Len/2nd Weight Sex Delivery Anes PTL Lv  3 SAB           2 Para     F Vag-Spont     1 Para     F Vag-Spont          ROS: A ROS was performed and pertinent positives and negatives are included in the history.  GENERAL: No fevers or chills. HEENT: No change in vision, no earache, sore throat or sinus congestion. NECK: No pain or stiffness. CARDIOVASCULAR: No chest pain or pressure. No palpitations. PULMONARY: No shortness of breath, cough or wheeze. GASTROINTESTINAL: No abdominal pain, nausea, vomiting or diarrhea, melena or bright red blood per rectum. GENITOURINARY: No urinary frequency, urgency, hesitancy or dysuria. MUSCULOSKELETAL: No joint or muscle pain, no back pain, no recent trauma. DERMATOLOGIC: No rash, no itching, no lesions. ENDOCRINE: No polyuria, polydipsia, no heat or cold intolerance. No recent change in weight. HEMATOLOGICAL: No anemia or easy  bruising or bleeding. NEUROLOGIC: No headache, seizures, numbness, tingling or weakness. PSYCHIATRIC: No depression, no loss of interest in normal activity or change in sleep pattern.     Exam: chaperone present  BP 124/78 mmHg  Ht  (1.575 m)  Wt 127 lb (57.607 kg)  BMI 23.22 kg/m2  Body mass index is 23.22 kg/(m^2).  General appearance : Well developed well nourished female. No acute distress HEENT: Eyes: no retinal hemorrhage or exudates,  Neck supple, trachea midline, no carotid bruits, no thyroidmegaly Lungs: Clear to auscultation, no rhonchi or wheezes, or rib retractions  Heart: Regular rate and rhythm, no murmurs or gallops Breast:Examined in sitting and supine position were symmetrical in appearance, no palpable masses or tenderness,  no skin retraction, no nipple inversion, no nipple discharge, no skin discoloration, no axillary or supraclavicular lymphadenopathy Abdomen: no palpable masses or tenderness, no rebound or guarding Extremities: no edema or skin discoloration or tenderness  Pelvic:  Bartholin, Urethra, Skene Glands: Within normal limits             Vagina: No gross lesions or discharge  Cervix: No gross lesions or discharge, IUD string seen  Uterus  anteverted, normal size, shape and consistency, non-tender and mobile  Adnexa  Without masses or tenderness  Anus and perineum  normal   Rectovaginal  normal sphincter tone without palpated masses or tenderness  Hemoccult not indicated     Assessment/Plan:  44 y.o. female for annual exam doing well with a Mirena IUD no complaints. Normal exam today. Patient scheduled for mammogram next week. We discussed importance of monthly breast exams. We discussed importance of calcium vitamin D and regular exercise for osteoporosis prevention. Pap smear not done today.   Ok EdwardsFERNANDEZ,Nalee Lightle H MD, 9:19 AM 06/19/2015

## 2015-06-24 ENCOUNTER — Ambulatory Visit
Admission: RE | Admit: 2015-06-24 | Discharge: 2015-06-24 | Disposition: A | Discharge status: Home / Self Care | Payer: BLUE CROSS/BLUE SHIELD | Source: Ambulatory Visit

## 2015-06-24 DIAGNOSIS — Z1231 Encounter for screening mammogram for malignant neoplasm of breast: Secondary | ICD-10-CM

## 2015-09-01 ENCOUNTER — Ambulatory Visit (INDEPENDENT_AMBULATORY_CARE_PROVIDER_SITE_OTHER): Payer: BLUE CROSS/BLUE SHIELD | Admitting: Physician Assistant

## 2015-09-01 VITALS — BP 108/66 | HR 60 | Temp 98.4°F | Resp 16 | Ht 62.0 in | Wt 129.5 lb

## 2015-09-01 DIAGNOSIS — F329 Major depressive disorder, single episode, unspecified: Secondary | ICD-10-CM

## 2015-09-01 DIAGNOSIS — F411 Generalized anxiety disorder: Secondary | ICD-10-CM

## 2015-09-01 DIAGNOSIS — G4700 Insomnia, unspecified: Secondary | ICD-10-CM

## 2015-09-01 DIAGNOSIS — F32A Depression, unspecified: Secondary | ICD-10-CM

## 2015-09-01 MED ORDER — BUPROPION HCL ER (XL) 300 MG PO TB24: 300.0000 mg | ORAL_TABLET | Freq: Every day | ORAL | Status: DC

## 2015-09-01 MED ORDER — CLONAZEPAM 0.5 MG PO TABS: 0.2500 mg | ORAL_TABLET | Freq: Every evening | ORAL | Status: DC | PRN

## 2015-09-01 NOTE — Progress Notes (Signed)
Patient ID: Christy Haley, female    DOB: [birth year], 45 y.o.   MRN: [id number]  PCP: Olene Floss  Subjective:   Chief Complaint  Patient presents with  . Medication Refill    refill of klonopin    HPI Presents for evaluation of anxiety and depression and insomnia.  Feels like she is doing much better. Still having trouble falling asleep. No longer feeling the anxiety when she can't sleep. Previously felt like the "switch was 'on' all night long."  Trying to reduce clonazepam. Taking only 1/2 tablet. Hopes to cut back even more. Sometimes cuts the Wellbutrin XL in half, and is interested in reducing that, too.   Review of Systems  Constitutional: Negative for fever and chills.  Respiratory: Negative for cough and shortness of breath.   Cardiovascular: Negative for chest pain and palpitations.  Gastrointestinal: Negative for nausea, vomiting and diarrhea.  Neurological: Negative for dizziness, weakness and headaches.  Psychiatric/Behavioral: Positive for sleep disturbance (improving). Negative for suicidal ideas, hallucinations, behavioral problems, confusion, self-injury, dysphoric mood, decreased concentration and agitation. The patient is not nervous/anxious and is not hyperactive.        Patient Active Problem List   Diagnosis Date Noted  . Generalized anxiety disorder 02/20/2015  . IUD (intrauterine device) in place 08/12/2014  . Cervical lesion 06/21/2014  . Hyperglycemia 04/24/2013  . Muscle tiredness 04/12/2013  . Alopecia 04/12/2013  . Loss of weight 04/12/2013  . Hot flushes, perimenopausal 04/12/2013  . Iron deficiency anemia 12/07/2012  . Left lumbar radiculitis 12/07/2012  . Routine general medical examination at a health care facility 12/07/2012  . Leukopenia 01/14/2012  . GERD 01/06/2012     Prior to Admission medications   Medication Sig Start Date End Date Taking? Authorizing Provider  buPROPion (WELLBUTRIN XL) 300 MG 24 hr  tablet Take 1 tablet (300 mg total) by mouth daily. 03/24/15  Yes Parthenia Tellefsen, PA-C  calcium carbonate (OS-CAL) 600 MG TABS tablet Take 600 mg by mouth 2 (two) times daily with a meal.   Yes Historical Provider, MD  clonazePAM (KLONOPIN) 0.5 MG tablet Take 0.5-1 tablets (0.25-0.5 mg total) by mouth at bedtime as needed for anxiety. 06/10/15  Yes Airam Heidecker, PA-C  COLLAGEN PO Take by mouth.   Yes Historical Provider, MD  Multiple Vitamin (MULTIVITAMIN) tablet Take 1 tablet by mouth daily.     Yes Historical Provider, MD     No Known Allergies     Objective:  Physical Exam  Constitutional: She is oriented to person, place, and time. She appears well-developed and well-nourished. No distress.  BP 108/66 mmHg  Pulse 60  Temp(Src) 98.4 F (36.9 C) (Oral)  Resp 16  Ht  (1.575 m)  Wt 129 lb 8 oz (58.741 kg)  BMI 23.68 kg/m2  SpO2 99%   Eyes: Conjunctivae are normal. No scleral icterus.  Neck: No thyromegaly present.  Cardiovascular: Normal rate, regular rhythm, normal heart sounds and intact distal pulses.   Pulmonary/Chest: Effort normal and breath sounds normal.  Lymphadenopathy:    She has no cervical adenopathy.  Neurological: She is alert and oriented to person, place, and time.  Skin: Skin is warm and dry.  Psychiatric: She has a normal mood and affect. Her speech is normal and behavior is normal. Judgment and thought content normal. Cognition and memory are normal.           Assessment & Plan:   1. Generalized anxiety disorder 2. Depression 3. Insomnia We agree to  continue the WEllbutrin XL 300 mg for now, and keep working to reduce the clonazepam dose. Once she gets to the lowest effective dose, and isn't so reliant on it, we can consider reducing the WEllbutrin XL back to 150 mg. - clonazePAM (KLONOPIN) 0.5 MG tablet; Take 0.5-1 tablets (0.25-0.5 mg total) by mouth at bedtime as needed for anxiety.  Dispense: 30 tablet; Refill: 0 - buPROPion (WELLBUTRIN XL)  300 MG 24 hr tablet; Take 1 tablet (300 mg total) by mouth daily.  Dispense: 90 tablet; Refill: 3  Return in about 4 months (around 12/30/2015) for re-evaluation.   Fernande Bras, PA-C Physician Assistant-Certified [facility]

## 2015-09-01 NOTE — Patient Instructions (Signed)
You may use a pill cutter and reduce the clonazepam to 1/4 tablet. Let's continue the 300 mg of the Wellbutrin XL for now. When you get to a place where you're feeling good with minimal use of the clonazepam, we'll reduce the Wellbutrin dose.

## 2015-10-09 HISTORY — PX: AUGMENTATION MAMMAPLASTY: SUR837

## 2016-01-05 ENCOUNTER — Telehealth: Payer: Self-pay

## 2016-01-05 DIAGNOSIS — F32A Depression, unspecified: Secondary | ICD-10-CM

## 2016-01-05 DIAGNOSIS — F329 Major depressive disorder, single episode, unspecified: Secondary | ICD-10-CM

## 2016-01-05 MED ORDER — CLONAZEPAM 0.5 MG PO TABS: 0.2500 mg | ORAL_TABLET | Freq: Every evening | ORAL | Status: DC | PRN

## 2016-01-05 NOTE — Telephone Encounter (Signed)
Spoke to pt regarding med refill States, she has cut back since last vist. Cutting in half to stretch 2 months Ran out last Friday Please f/u

## 2016-01-05 NOTE — Telephone Encounter (Signed)
Meds ordered this encounter  Medications  . clonazePAM (KLONOPIN) 0.5 MG tablet    Sig: Take 0.5-1 tablets (0.25-0.5 mg total) by mouth at bedtime as needed for anxiety.    Dispense:  30 tablet    Refill:  0    Order Specific Question:  Supervising Provider    Answer:  Merla RichesOLITTLE, ROBERT P [[id number]]    Please advise patient to follow-up with me before she runs out.

## 2016-01-05 NOTE — Telephone Encounter (Signed)
Patient needs her clonazePAM (KLONOPIN) 0.5 MG tablet refilled, her call back number is [phone]. Thank you

## 2016-01-14 NOTE — Telephone Encounter (Signed)
Called pt and reminded her that she is due for f/up. She agreed and I explained our new same/next day appts.

## 2016-05-18 ENCOUNTER — Other Ambulatory Visit: Payer: Self-pay | Admitting: Gynecology

## 2016-05-18 DIAGNOSIS — Z1231 Encounter for screening mammogram for malignant neoplasm of breast: Secondary | ICD-10-CM

## 2016-06-24 ENCOUNTER — Other Ambulatory Visit: Payer: Self-pay | Admitting: Gynecology

## 2016-06-24 ENCOUNTER — Ambulatory Visit
Admission: RE | Admit: 2016-06-24 | Discharge: 2016-06-24 | Disposition: A | Discharge status: Home / Self Care | Payer: BLUE CROSS/BLUE SHIELD | Source: Ambulatory Visit | Attending: Gynecology | Admitting: Gynecology

## 2016-06-24 DIAGNOSIS — Z1231 Encounter for screening mammogram for malignant neoplasm of breast: Secondary | ICD-10-CM

## 2016-07-02 ENCOUNTER — Ambulatory Visit (INDEPENDENT_AMBULATORY_CARE_PROVIDER_SITE_OTHER): Payer: BLUE CROSS/BLUE SHIELD | Admitting: Gynecology

## 2016-07-02 ENCOUNTER — Encounter: Payer: Self-pay | Admitting: Gynecology

## 2016-07-02 VITALS — BP 118/76 | Ht 62.0 in | Wt 130.0 lb

## 2016-07-02 DIAGNOSIS — Z01419 Encounter for gynecological examination (general) (routine) without abnormal findings: Secondary | ICD-10-CM

## 2016-07-02 DIAGNOSIS — Z23 Encounter for immunization: Secondary | ICD-10-CM | POA: Diagnosis not present

## 2016-07-02 NOTE — Progress Notes (Signed)
Christy Haley Dec 05, 1970 [id number]   History:    45 y.o.  for annual gyn exam with no complaints today and requesting the flu vaccine today. Patient had the Mirena IUD placed in 2015 and has done well. [facility]Patient with no past history of abnormal Pap smear. In 2015 she underwent a cervical biopsy and colposcopy as a result of an abnormality seen on the ectocervix and the pathology report was benign nabothian cystsPatient with no past history of abnormal Pap smear. In 2015 she underwent a cervical biopsy and colposcopy as a result of an abnormality seen on the ectocervix and the pathology report was benign nabothian cysts  Past medical history,surgical history, family history and social history were all reviewed and documented in the EPIC chart.  Gynecologic History No LMP recorded. Patient is not currently having periods (Reason: IUD). Contraception: IUD Last Pap: 2015. Results were: normal Last mammogram: 2017. Results were: normal  Obstetric History OB History  Gravida Para Term Preterm AB Living  3 2     1 2   SAB TAB Ectopic Multiple Live Births  1            # Outcome Date GA Lbr Len/2nd Weight Sex Delivery Anes PTL Lv  3 SAB           2 Para     F Vag-Spont     1 Para     F Vag-Spont          ROS: A ROS was performed and pertinent positives and negatives are included in the history.  GENERAL: No fevers or chills. HEENT: No change in vision, no earache, sore throat or sinus congestion. NECK: No pain or stiffness. CARDIOVASCULAR: No chest pain or pressure. No palpitations. PULMONARY: No shortness of breath, cough or wheeze. GASTROINTESTINAL: No abdominal pain, nausea, vomiting or diarrhea, melena or bright red blood per rectum. GENITOURINARY: No urinary frequency, urgency, hesitancy or dysuria. MUSCULOSKELETAL: No joint or muscle pain, no back pain, no recent trauma. DERMATOLOGIC:  No rash, no itching, no lesions. ENDOCRINE: No polyuria, polydipsia, no heat or cold intolerance. No recent change in weight. HEMATOLOGICAL: No anemia or easy bruising or bleeding. NEUROLOGIC: No headache, seizures, numbness, tingling or weakness. PSYCHIATRIC: No depression, no loss of interest in normal activity or change in sleep pattern.     Exam: chaperone present  BP 118/76   Ht 5\' 2"  (1.575 m)   Wt 130 lb (59 kg)   BMI 23.78 kg/m   Body mass index is 23.78 kg/m.  General appearance : Well developed well nourished female. No acute distress HEENT: Eyes: no retinal hemorrhage or exudates,  Neck supple, trachea midline, no carotid bruits, no thyroidmegaly Lungs: Clear to auscultation, no rhonchi or wheezes, or rib retractions  Heart: Regular rate and rhythm, no murmurs or gallops Breast:Examined in sitting and supine position were symmetrical in appearance, no palpable masses or tenderness,  no skin retraction, no nipple inversion, no nipple discharge, no skin discoloration, no axillary or supraclavicular lymphadenopathy Abdomen: no palpable masses or tenderness, no rebound or guarding Extremities: no edema or skin discoloration or tenderness  Pelvic:  Bartholin, Urethra, Skene Glands: Within normal limits             Vagina: No gross lesions or discharge  Cervix: No gross lesions or discharge, IUD string seen  Uterus  anteverted, normal size, shape and consistency, non-tender and mobile  Adnexa  Without masses or tenderness  Anus and perineum  normal   Rectovaginal  normal sphincter tone without palpated masses or tenderness             Hemoccult not indicated     Assessment/Plan:  45 y.o. female for annual exam doing well. Receive the flu vaccine as per her request today. Her PCP has been doing her blood work. She was reminded on the importance of monthly breast exams. She will need her Pap smear next year. Patient doing well with Mirena IUD.   Christy Haley,Christy Zylstra H MD, 10:05 AM  07/02/2016

## 2016-07-02 NOTE — Addendum Note (Signed)
Addended by: Berna SpareASTILLO, Rowe Warman A on: 07/02/2016 10:23 AM   Modules accepted: Orders

## 2016-07-02 NOTE — Patient Instructions (Signed)
Influenza Virus Vaccine (Flucelvax) Qu es este medicamento? La VACUNA ANTIGRIPAL ayuda a disminuir el riesgo de contraer la influenza, tambin conocida como la gripe. La vacuna solo ayuda a protegerle contra algunas cepas de influenza. MARCAS COMUNES: FLUCELVAX Qu le debo informar a mi profesional de la salud antes de tomar este medicamento? Necesita saber si usted presenta alguno de los siguientes problemas o situaciones: -trastorno de sangrado como hemofilia -fiebre o infeccin -sndrome de Guillain-Barre u otros problemas neurolgicos -problemas del sistema inmunolgico -infeccin por el virus de la inmunodeficiencia humana (VIH) o SIDA -niveles bajos de plaquetas en la sangre -esclerosis mltiple -una reaccin alrgica o inusual a las vacunas antigripales, a otros medicamentos, alimentos, colorantes o conservantes -si est embarazada o buscando quedar embarazada -si est amamantando a un beb Cmo debo utilizar este medicamento? Esta vacuna se administra mediante inyeccin por va intramuscular. Lo administra un profesional de la salud. Recibir una copia de informacin escrita sobre la vacuna antes de cada vacuna. Asegrese de leer este folleto cada vez cuidadosamente. Este folleto puede cambiar con frecuencia. Hable con su pediatra para informarse acerca del uso de este medicamento en nios. Puede requerir atencin especial. Qu sucede si me olvido de una dosis? No se aplica en este caso. Qu puede interactuar con este medicamento? -quimioterapia o radioterapia -medicamentos que suprimen el sistema inmunolgico, tales como etanercept, anakinra, infliximab y adalimumab -medicamentos que tratan o previenen cogulos sanguneos, como warfarina -fenitona -medicamentos esteroideos, como la prednisona o la cortisona -teofilina -vacunas A qu debo estar atento al usar este medicamento? Informe a su mdico o a su profesional de la salud sobre todos los efectos secundarios que  persistan despus de 3 das. Llame a su proveedor de atencin mdica si se presentan sntomas inusuales dentro de las 6 semanas de recibir esta vacuna. Es posible que todava pueda contraer la gripe, pero la enfermedad no ser tan fuerte como normalmente. No puede contraer la gripe de esta vacuna. La vacuna antigripal no le protege contra resfros u otras enfermedades que pueden causar fiebre. Debe vacunarse cada ao. Qu efectos secundarios puedo tener al utilizar este medicamento? Efectos secundarios que debe informar a su mdico o a su profesional de la salud tan pronto como sea posible: -reacciones alrgicas como erupcin cutnea, picazn o urticarias, hinchazn de la cara, labios o lengua Efectos secundarios que, por lo general, no requieren atencin mdica (debe informarlos a su mdico o a su profesional de la salud si persisten o si son molestos): -fiebre -dolor de cabeza -molestias y dolores musculares -dolor, sensibilidad, enrojecimiento o hinchazn en el lugar de la inyeccin -cansancio Dnde debo guardar mi medicina? Esta vacuna se administrar por un profesional de la salud en una clnica, farmacia, consultorio mdico u otro consultorio de un profesional de la salud. No se le suministrar esta vacuna para guardar en su domicilio.  2017 Elsevier/Gold Standard (2011-06-14 16:29:16)  

## 2016-09-01 ENCOUNTER — Other Ambulatory Visit: Payer: Self-pay | Admitting: Physician Assistant

## 2016-09-01 DIAGNOSIS — F329 Major depressive disorder, single episode, unspecified: Secondary | ICD-10-CM

## 2016-09-01 DIAGNOSIS — F32A Depression, unspecified: Secondary | ICD-10-CM

## 2016-09-02 NOTE — Telephone Encounter (Signed)
Meds ordered this encounter  Medications  . buPROPion (WELLBUTRIN XL) 300 MG 24 hr tablet    Sig: TAKE 1 TABLET BY MOUTH EVERY DAY    Dispense:  90 tablet    Refill:  0    Patient notified via My Chart.

## 2016-11-11 ENCOUNTER — Ambulatory Visit: Payer: BLUE CROSS/BLUE SHIELD | Admitting: Physician Assistant

## 2016-11-12 ENCOUNTER — Ambulatory Visit: Payer: BLUE CROSS/BLUE SHIELD | Admitting: Physician Assistant

## 2016-11-24 ENCOUNTER — Encounter: Payer: Self-pay | Admitting: Gynecology

## 2016-12-17 ENCOUNTER — Encounter: Payer: Self-pay | Admitting: Physician Assistant

## 2016-12-17 ENCOUNTER — Ambulatory Visit (INDEPENDENT_AMBULATORY_CARE_PROVIDER_SITE_OTHER): Payer: BLUE CROSS/BLUE SHIELD | Admitting: Physician Assistant

## 2016-12-17 VITALS — BP 112/67 | HR 59 | Temp 98.2°F | Resp 18 | Ht 62.0 in | Wt 133.8 lb

## 2016-12-17 DIAGNOSIS — F3341 Major depressive disorder, recurrent, in partial remission: Secondary | ICD-10-CM

## 2016-12-17 DIAGNOSIS — D72819 Decreased white blood cell count, unspecified: Secondary | ICD-10-CM | POA: Diagnosis not present

## 2016-12-17 DIAGNOSIS — Z13220 Encounter for screening for lipoid disorders: Secondary | ICD-10-CM | POA: Diagnosis not present

## 2016-12-17 DIAGNOSIS — F411 Generalized anxiety disorder: Secondary | ICD-10-CM | POA: Diagnosis not present

## 2016-12-17 DIAGNOSIS — D509 Iron deficiency anemia, unspecified: Secondary | ICD-10-CM

## 2016-12-17 DIAGNOSIS — R739 Hyperglycemia, unspecified: Secondary | ICD-10-CM | POA: Diagnosis not present

## 2016-12-17 MED ORDER — CLONAZEPAM 0.5 MG PO TABS: 0.2500 mg | ORAL_TABLET | Freq: Every evening | ORAL | 0 refills | Status: DC | PRN

## 2016-12-17 MED ORDER — BUPROPION HCL ER (XL) 300 MG PO TB24: 300.0000 mg | ORAL_TABLET | Freq: Every day | ORAL | 3 refills | Status: DC

## 2016-12-17 MED ORDER — BUPROPION HCL ER (XL) 150 MG PO TB24: ORAL_TABLET | ORAL | 0 refills | Status: DC

## 2016-12-17 NOTE — Patient Instructions (Signed)
     IF you received an x-ray today, you will receive an invoice from [facility]. Please contact [facility] at [phone] with questions or concerns regarding your invoice.   IF you received labwork today, you will receive an invoice from [facility]. Please contact [facility] at [phone] with questions or concerns regarding your invoice.   Our billing staff will not be able to assist you with questions regarding bills from these companies.  You will be contacted with the lab results as soon as they are available. The fastest way to get your results is to activate your My Chart account. Instructions are located on the last page of this paperwork. If you have not heard from us regarding the results in 2 weeks, please contact this office.     

## 2016-12-17 NOTE — Progress Notes (Signed)
Patient ID: Christy Haley, female    DOB: 09/14/1970, 46 y.o.   MRN: [id number]  PCP: Porfirio OarJeffery, Antion Andres, PA-C  Chief Complaint  Patient presents with  . Medication Management    wellbutrin     Subjective:   Presents for medication refill (bupropion).  I last saw her 09/01/2015. At that time, we advised her to RTC in 3 months.  Has had breast reduction, and while she is pleased with the smaller size, but is disappointment in the scars.  Had been able to stop the clonazepam and was maintained well on the Wellbutrin alone. Stopped Wellbutrin about 2 months ago. Ran out, and because she hadn't been in in >12 months, I did not authorize a refill. Thought she would be ok without it, but has been having insomnia and anxiety again, especially x 2 weeks. Negatively impacting her friend relationships. Wants to restart it.    Review of Systems As above. No CP, SOB, HA, dizziness.    Patient Active Problem List   Diagnosis Date Noted  . Generalized anxiety disorder 02/20/2015  . IUD (intrauterine device) in place 08/12/2014  . Cervical lesion 06/21/2014  . Hyperglycemia 04/24/2013  . Muscle tiredness 04/12/2013  . Alopecia 04/12/2013  . Loss of weight 04/12/2013  . Hot flushes, perimenopausal 04/12/2013  . Iron deficiency anemia 12/07/2012  . Left lumbar radiculitis 12/07/2012  . Routine general medical examination at a health care facility 12/07/2012  . Leukopenia 01/14/2012  . GERD 01/06/2012     Prior to Admission medications   Medication Sig Start Date End Date Taking? Authorizing Provider  buPROPion (WELLBUTRIN XL) 300 MG 24 hr tablet TAKE 1 TABLET BY MOUTH EVERY DAY 09/02/16  Yes Aynslee Mulhall, PA-C  calcium carbonate (OS-CAL) 600 MG TABS tablet Take 600 mg by mouth 2 (two) times daily with a meal.   Yes [provider]  COLLAGEN PO Take by mouth.   Yes [provider]  Multiple Vitamin (MULTIVITAMIN) tablet Take 1 tablet by mouth  daily.     Yes [provider]     Allergies  Allergen Reactions  . Oxycodone Rash       Objective:  Physical Exam  Constitutional: She is oriented to person, place, and time. She appears well-developed and well-nourished. She is active and cooperative. No distress.  BP 112/67   Pulse (!) 59   Temp 98.2 F (36.8 C) (Oral)   Resp 18   Ht 5\' 2"  (1.575 m)   Wt 133 lb 12.8 oz (60.7 kg)   SpO2 99%   BMI 24.47 kg/m   HENT:  Head: Normocephalic and atraumatic.  Right Ear: Hearing normal.  Left Ear: Hearing normal.  Eyes: Conjunctivae are normal. No scleral icterus.  Neck: Normal range of motion. Neck supple. No thyromegaly present.  Cardiovascular: Regular rhythm and normal heart sounds.  Bradycardia present.   Pulses:      Radial pulses are 2+ on the right side, and 2+ on the left side.  Pulmonary/Chest: Effort normal and breath sounds normal.  Lymphadenopathy:       Head (right side): No tonsillar, no preauricular, no posterior auricular and no occipital adenopathy present.       Head (left side): No tonsillar, no preauricular, no posterior auricular and no occipital adenopathy present.    She has no cervical adenopathy.       Right: No supraclavicular adenopathy present.       Left: No supraclavicular adenopathy present.  Neurological: She  is alert and oriented to person, place, and time. No sensory deficit.  Skin: Skin is warm, dry and intact. No rash noted. No cyanosis or erythema. Nails show no clubbing.  Psychiatric: She has a normal mood and affect. Her speech is normal and behavior is normal. Judgment and thought content normal. Cognition and memory are normal.           Assessment & Plan:   Problem List Items Addressed This Visit    Leukopenia    Update CBC      Relevant Orders   CBC with Differential/Platelet (Completed)   Iron deficiency anemia    Update CBC      Relevant Orders   CBC with Differential/Platelet (Completed)   Hyperglycemia      Update labs      Relevant Orders   Comprehensive metabolic panel (Completed)   Generalized anxiety disorder - Primary    Resume bupropion XL, initially 150 mg daily, then increase back to 300 mg daily. Clonazepam PRN until she is stable on the bupropion.      Relevant Medications   buPROPion (WELLBUTRIN XL) 300 MG 24 hr tablet   buPROPion (WELLBUTRIN XL) 150 MG 24 hr tablet   clonazePAM (KLONOPIN) 0.5 MG tablet   Other Relevant Orders   TSH (Completed)   T4, free (Completed)   Recurrent major depressive disorder, in partial remission (HCC)    Resume bupropion XL, initially 150 mg daily, then increase back to 300 mg daily.      Relevant Medications   buPROPion (WELLBUTRIN XL) 300 MG 24 hr tablet   buPROPion (WELLBUTRIN XL) 150 MG 24 hr tablet   clonazePAM (KLONOPIN) 0.5 MG tablet   Other Relevant Orders   TSH (Completed)   T4, free (Completed)    Other Visit Diagnoses    Screening for hyperlipidemia       Relevant Orders   Lipid panel (Completed)       Return in about 6 months (around 06/18/2017) for re-evaluation of mood.   Fernande Bras, PA-C [facility]

## 2016-12-18 LAB — CBC WITH DIFFERENTIAL/PLATELET
Basophils Absolute: 0 10*3/uL (ref 0.0–0.2)
Basos: 0 %
EOS (ABSOLUTE): 0.1 10*3/uL (ref 0.0–0.4)
Eos: 2 %
Hematocrit: 36.5 % (ref 34.0–46.6)
Hemoglobin: 11.8 g/dL (ref 11.1–15.9)
Immature Grans (Abs): 0 10*3/uL (ref 0.0–0.1)
Immature Granulocytes: 0 %
Lymphocytes Absolute: 1.1 10*3/uL (ref 0.7–3.1)
Lymphs: 19 %
MCH: 27.4 pg (ref 26.6–33.0)
MCHC: 32.3 g/dL (ref 31.5–35.7)
MCV: 85 fL (ref 79–97)
Monocytes Absolute: 0.6 10*3/uL (ref 0.1–0.9)
Monocytes: 10 %
Neutrophils Absolute: 3.9 10*3/uL (ref 1.4–7.0)
Neutrophils: 69 %
Platelets: 294 10*3/uL (ref 150–379)
RBC: 4.31 x10E6/uL (ref 3.77–5.28)
RDW: 14 % (ref 12.3–15.4)
WBC: 5.7 10*3/uL (ref 3.4–10.8)

## 2016-12-18 LAB — COMPREHENSIVE METABOLIC PANEL
ALT: 24 IU/L (ref 0–32)
AST: 25 IU/L (ref 0–40)
Albumin/Globulin Ratio: 2.3 — ABNORMAL HIGH (ref 1.2–2.2)
Albumin: 4.6 g/dL (ref 3.5–5.5)
Alkaline Phosphatase: 69 IU/L (ref 39–117)
BUN/Creatinine Ratio: 20 (ref 9–23)
BUN: 18 mg/dL (ref 6–24)
Bilirubin Total: 0.2 mg/dL (ref 0.0–1.2)
CO2: 25 mmol/L (ref 18–29)
Calcium: 9.6 mg/dL (ref 8.7–10.2)
Chloride: 103 mmol/L (ref 96–106)
Creatinine, Ser: 0.89 mg/dL (ref 0.57–1.00)
GFR calc Af Amer: 91 mL/min/{1.73_m2} (ref >59)
GFR calc non Af Amer: 79 mL/min/{1.73_m2} (ref >59)
Globulin, Total: 2 g/dL (ref 1.5–4.5)
Glucose: 97 mg/dL (ref 65–99)
Potassium: 4.8 mmol/L (ref 3.5–5.2)
Sodium: 142 mmol/L (ref 134–144)
Total Protein: 6.6 g/dL (ref 6.0–8.5)

## 2016-12-18 LAB — LIPID PANEL
Chol/HDL Ratio: 1.7 ratio (ref 0.0–4.4)
Cholesterol, Total: 163 mg/dL (ref 100–199)
HDL: 97 mg/dL
LDL Calculated: 52 mg/dL (ref 0–99)
Triglycerides: 68 mg/dL (ref 0–149)
VLDL Cholesterol Cal: 14 mg/dL (ref 5–40)

## 2016-12-18 LAB — TSH: TSH: 1.28 u[IU]/mL (ref 0.450–4.500)

## 2016-12-18 LAB — T4, FREE: Free T4: 1.16 ng/dL (ref 0.82–1.77)

## 2017-01-12 DIAGNOSIS — F3341 Major depressive disorder, recurrent, in partial remission: Secondary | ICD-10-CM | POA: Insufficient documentation

## 2017-01-12 NOTE — Assessment & Plan Note (Addendum)
Resume bupropion XL, initially 150 mg daily, then increase back to 300 mg daily. Clonazepam PRN until she is stable on the bupropion.

## 2017-01-12 NOTE — Assessment & Plan Note (Signed)
Update CBC. 

## 2017-01-12 NOTE — Assessment & Plan Note (Signed)
Update labs.  

## 2017-01-12 NOTE — Assessment & Plan Note (Signed)
Resume bupropion XL, initially 150 mg daily, then increase back to 300 mg daily.

## 2017-05-28 ENCOUNTER — Ambulatory Visit: Payer: BLUE CROSS/BLUE SHIELD | Admitting: Physician Assistant

## 2017-05-28 ENCOUNTER — Other Ambulatory Visit: Payer: Self-pay

## 2017-05-28 ENCOUNTER — Ambulatory Visit (INDEPENDENT_AMBULATORY_CARE_PROVIDER_SITE_OTHER): Payer: BLUE CROSS/BLUE SHIELD

## 2017-05-28 ENCOUNTER — Encounter: Payer: Self-pay | Admitting: Physician Assistant

## 2017-05-28 VITALS — BP 120/70 | HR 53 | Temp 98.2°F | Resp 16 | Ht 61.75 in | Wt 134.0 lb

## 2017-05-28 DIAGNOSIS — M25461 Effusion, right knee: Secondary | ICD-10-CM | POA: Diagnosis not present

## 2017-05-28 DIAGNOSIS — M25561 Pain in right knee: Secondary | ICD-10-CM

## 2017-05-28 MED ORDER — IBUPROFEN 600 MG PO TABS: 600.0000 mg | ORAL_TABLET | Freq: Three times a day (TID) | ORAL | 0 refills | Status: DC | PRN

## 2017-05-28 NOTE — Progress Notes (Signed)
[facility] AT [facility] [address], [place] [phone]  Date:  05/28/2017   Name:  Christy Haley   DOB:  [birth year]   MRN:  [id number]  PCP:  Porfirio OarJeffery, Chelle, PA-C    History of Present Illness:  Christy Haley is a 46 y.o. female patient who presents to PCP with  Chief Complaint  Patient presents with  . Knee Pain    right x 6 days, swollen     6 days ago, developed pain in the back of knee swelling.  She rested, but has noticed that the swelling when she is walking and working.  Yesterday, she elevated her leg.  She elevated knee to help.  She is runner 3-4 days per week for 3 miles.  She is weight lifting, does leg press.   In review of her charts, there is a history of this right knee pain and swelling due to injury 2 years ago.  She has had an issue with this right knee years prior--referred to ortho, where no surgical intervention has ever been performed.   Patient Active Problem List   Diagnosis Date Noted  . Recurrent major depressive disorder, in partial remission (HCC) 01/12/2017  . Generalized anxiety disorder 02/20/2015  . IUD (intrauterine device) in place 08/12/2014  . Cervical lesion 06/21/2014  . Hyperglycemia 04/24/2013  . Muscle tiredness 04/12/2013  . Alopecia 04/12/2013  . Loss of weight 04/12/2013  . Hot flushes, perimenopausal 04/12/2013  . Iron deficiency anemia 12/07/2012  . Left lumbar radiculitis 12/07/2012  . Leukopenia 01/14/2012  . GERD 01/06/2012    Past Medical History:  Diagnosis Date  . Anxiety   . Depression   . GERD (gastroesophageal reflux disease)     Past Surgical History:  Procedure Laterality Date  . AUGMENTATION MAMMAPLASTY  10/09/2015   breast lift and implants   . INTRAUTERINE DEVICE INSERTION  2010   Mirena  . PELVIC LAPAROSCOPY     ovarian cystectomy    Social History   Tobacco Use  . Smoking status: Never Smoker  . Smokeless tobacco: Never Used  Substance Use Topics  .  Alcohol use: No    Alcohol/week: 0.0 oz  . Drug use: No    Family History  Problem Relation Age of Onset  . Arthritis Mother   . Hypertension Mother   . Diabetes Maternal Grandmother   . Heart disease Neg Hx   . Hyperlipidemia Neg Hx   . Kidney disease Neg Hx   . Stroke Neg Hx   . Alcohol abuse Neg Hx   . Cancer Neg Hx   . COPD Neg Hx   . Early death Neg Hx     Allergies  Allergen Reactions  . Oxycodone Rash    Medication list has been reviewed and updated.  Current Outpatient Medications on File Prior to Visit  Medication Sig Dispense Refill  . buPROPion (WELLBUTRIN XL) 150 MG 24 hr tablet Take 1 (150 mg) daily for 2 weeks, then increase to 2 tablets one time each day 45 tablet 0  . buPROPion (WELLBUTRIN XL) 300 MG 24 hr tablet Take 1 tablet (300 mg total) by mouth daily. 90 tablet 3  . calcium carbonate (OS-CAL) 600 MG TABS tablet Take 600 mg by mouth 2 (two) times daily with a meal.    . clonazePAM (KLONOPIN) 0.5 MG tablet Take 0.5-1 tablets (0.25-0.5 mg total) by mouth at bedtime as needed for anxiety. 30 tablet 0  . COLLAGEN PO Take  by mouth.    . Multiple Vitamin (MULTIVITAMIN) tablet Take 1 tablet by mouth daily.       Current Facility-Administered Medications on File Prior to Visit  Medication Dose Route Frequency Provider Last Rate Last Dose  . levonorgestrel (MIRENA) 20 MCG/24HR IUD   Intrauterine Once Ok EdwardsFernandez, Juan H, MD        ROS ROS otherwise unremarkable unless listed above.  Physical Examination: BP 120/70   Pulse (!) 53   Temp 98.2 F (36.8 C) (Oral)   Resp 16   Ht 5' 1.75" (1.568 m)   Wt 134 lb (60.8 kg)   SpO2 100%   BMI 24.71 kg/m  Ideal Body Weight: Weight in (lb) to have BMI = 25: 135.3  Physical Exam  Constitutional: She is oriented to person, place, and time. She appears well-developed and well-nourished. No distress.  HENT:  Head: Normocephalic and atraumatic.  Right Ear: External ear normal.  Left Ear: External ear normal.   Eyes: Conjunctivae and EOM are normal. Pupils are equal, round, and reactive to light.  Cardiovascular: Normal rate.  Pulmonary/Chest: Effort normal. No respiratory distress.  Musculoskeletal:       Right knee: She exhibits swelling. She exhibits normal range of motion. Tenderness found. Medial joint line and lateral joint line tenderness noted.  Neurological: She is alert and oriented to person, place, and time.  Skin: She is not diaphoretic.  Psychiatric: She has a normal mood and affect. Her behavior is normal.    Dg Knee Complete 4 Views Right  Result Date: 05/28/2017 CLINICAL DATA:  Right knee pain EXAM: RIGHT KNEE - COMPLETE 4+ VIEW COMPARISON:  05/05/2015 FINDINGS: No acute bony abnormality. Specifically, no fracture, subluxation, or dislocation. Soft tissues are intact. Joint spaces maintained. No joint effusion. IMPRESSION: No acute bony abnormality. Electronically Signed   By: Charlett NoseKevin  Dover M.D.   On: 05/28/2017 11:32     Assessment and Plan: Christy Haley is a 46 y.o. female who is here today  Chief Complaint  Patient presents with  . Knee Pain    right x 6 days, swollen   --will return to orthopedics at this time.  This likely will need mri.   Acute pain of right knee - Plan: DG Knee Complete 4 Views Right, AMB referral to orthopedics  Pain and swelling of right knee - Plan: DG Knee Complete 4 Views Right, AMB referral to orthopedics  Trena PlattStephanie Keondre Markson, PA-C [facility] 11/24/20189:35 AM

## 2017-05-28 NOTE — Patient Instructions (Addendum)
The xray appears to be normal according to the radiologist.   I am going to put you in a brace for the next week.   I would like you to ice the knee and elevate.   If you continue to have this pain, I will place a referral to orthopedics.  Please take the ibuprofen with food on your stomach.  This will help with inflammation and should be taken at least twice per week.  Knee Pain, Adult Many things can cause knee pain. The pain often goes away on its own with time and rest. If the pain does not go away, tests may be done to find out what is causing the pain. Follow these instructions at home: Activity  Rest your knee.  Do not do things that cause pain.  Avoid activities where both feet leave the ground at the same time (high-impact activities). Examples are running, jumping rope, and doing jumping jacks. General instructions  Take medicines only as told by your doctor.  Raise (elevate) your knee when you are resting. Make sure your knee is higher than your heart.  Sleep with a pillow under your knee.  If told, put ice on the knee: ? Put ice in a plastic bag. ? Place a towel between your skin and the bag. ? Leave the ice on for 20 minutes, 2-3 times a day.  Ask your doctor if you should wear an elastic knee support.  Lose weight if you are overweight. Being overweight can make your knee hurt more.  Do not use any tobacco products. These include cigarettes, chewing tobacco, or electronic cigarettes. If you need help quitting, ask your doctor. Smoking may slow down healing. Contact a doctor if:  The pain does not stop.  The pain changes or gets worse.  You have a fever along with knee pain.  Your knee gives out or locks up.  Your knee swells, and becomes worse. Get help right away if:  Your knee feels warm.  You cannot move your knee.  You have very bad knee pain.  You have chest pain.  You have trouble breathing. Summary  Many things can cause knee pain. The  pain often goes away on its own with time and rest.  Avoid activities that put stress on your knee. These include running and jumping rope.  Get help right away if you cannot move your knee, or if your knee feels warm, or if you have trouble breathing. This information is not intended to replace advice given to you by your health care provider. Make sure you discuss any questions you have with your health care provider. Document Released: 09/24/2008 Document Revised: 06/22/2016 Document Reviewed: 06/22/2016 Elsevier Interactive Patient Education  2017 [employer]    IF you received an x-ray today, you will receive an invoice from [facility]. Please contact [facility] at [phone] with questions or concerns regarding your invoice.   IF you received labwork today, you will receive an invoice from [facility]. Please contact [facility] at [phone] with questions or concerns regarding your invoice.   Our billing staff will not be able to assist you with questions regarding bills from these companies.  You will be contacted with the lab results as soon as they are available. The fastest way to get your results is to activate your My Chart account. Instructions are located on the last page of this paperwork. If you have not heard from us regarding the results in 2 weeks, please contact this office.

## 2017-05-30 DIAGNOSIS — M25512 Pain in left shoulder: Secondary | ICD-10-CM | POA: Diagnosis not present

## 2017-05-30 DIAGNOSIS — M542 Cervicalgia: Secondary | ICD-10-CM | POA: Diagnosis not present

## 2017-05-30 DIAGNOSIS — M25511 Pain in right shoulder: Secondary | ICD-10-CM | POA: Diagnosis not present

## 2017-06-08 ENCOUNTER — Other Ambulatory Visit: Payer: Self-pay | Admitting: Obstetrics & Gynecology

## 2017-06-08 DIAGNOSIS — Z1231 Encounter for screening mammogram for malignant neoplasm of breast: Secondary | ICD-10-CM

## 2017-06-09 DIAGNOSIS — M542 Cervicalgia: Secondary | ICD-10-CM | POA: Diagnosis not present

## 2017-06-09 DIAGNOSIS — M25512 Pain in left shoulder: Secondary | ICD-10-CM | POA: Diagnosis not present

## 2017-06-09 DIAGNOSIS — M25511 Pain in right shoulder: Secondary | ICD-10-CM | POA: Diagnosis not present

## 2017-06-16 DIAGNOSIS — M25511 Pain in right shoulder: Secondary | ICD-10-CM | POA: Diagnosis not present

## 2017-06-16 DIAGNOSIS — M25512 Pain in left shoulder: Secondary | ICD-10-CM | POA: Diagnosis not present

## 2017-06-16 DIAGNOSIS — M542 Cervicalgia: Secondary | ICD-10-CM | POA: Diagnosis not present

## 2017-06-23 DIAGNOSIS — M25512 Pain in left shoulder: Secondary | ICD-10-CM | POA: Diagnosis not present

## 2017-06-23 DIAGNOSIS — M542 Cervicalgia: Secondary | ICD-10-CM | POA: Diagnosis not present

## 2017-06-23 DIAGNOSIS — M25511 Pain in right shoulder: Secondary | ICD-10-CM | POA: Diagnosis not present

## 2017-06-30 DIAGNOSIS — M25512 Pain in left shoulder: Secondary | ICD-10-CM | POA: Diagnosis not present

## 2017-06-30 DIAGNOSIS — M542 Cervicalgia: Secondary | ICD-10-CM | POA: Diagnosis not present

## 2017-06-30 DIAGNOSIS — M25511 Pain in right shoulder: Secondary | ICD-10-CM | POA: Diagnosis not present

## 2017-07-21 ENCOUNTER — Ambulatory Visit
Admission: RE | Admit: 2017-07-21 | Discharge: 2017-07-21 | Disposition: A | Discharge status: Home / Self Care | Payer: BLUE CROSS/BLUE SHIELD | Source: Ambulatory Visit | Attending: Obstetrics & Gynecology | Admitting: Obstetrics & Gynecology

## 2017-07-21 DIAGNOSIS — Z1231 Encounter for screening mammogram for malignant neoplasm of breast: Secondary | ICD-10-CM

## 2017-07-26 ENCOUNTER — Encounter: Payer: BLUE CROSS/BLUE SHIELD | Admitting: Obstetrics & Gynecology

## 2017-07-27 ENCOUNTER — Encounter: Payer: BLUE CROSS/BLUE SHIELD | Admitting: Obstetrics & Gynecology

## 2017-07-27 DIAGNOSIS — M542 Cervicalgia: Secondary | ICD-10-CM | POA: Diagnosis not present

## 2017-07-27 DIAGNOSIS — M25511 Pain in right shoulder: Secondary | ICD-10-CM | POA: Diagnosis not present

## 2017-07-27 DIAGNOSIS — M25512 Pain in left shoulder: Secondary | ICD-10-CM | POA: Diagnosis not present

## 2017-08-04 DIAGNOSIS — M25511 Pain in right shoulder: Secondary | ICD-10-CM | POA: Diagnosis not present

## 2017-08-04 DIAGNOSIS — M25512 Pain in left shoulder: Secondary | ICD-10-CM | POA: Diagnosis not present

## 2017-08-04 DIAGNOSIS — M542 Cervicalgia: Secondary | ICD-10-CM | POA: Diagnosis not present

## 2017-08-18 DIAGNOSIS — M25512 Pain in left shoulder: Secondary | ICD-10-CM | POA: Diagnosis not present

## 2017-08-18 DIAGNOSIS — M25511 Pain in right shoulder: Secondary | ICD-10-CM | POA: Diagnosis not present

## 2017-08-18 DIAGNOSIS — M542 Cervicalgia: Secondary | ICD-10-CM | POA: Diagnosis not present

## 2017-08-24 DIAGNOSIS — M25511 Pain in right shoulder: Secondary | ICD-10-CM | POA: Diagnosis not present

## 2017-08-24 DIAGNOSIS — M25512 Pain in left shoulder: Secondary | ICD-10-CM | POA: Diagnosis not present

## 2017-08-24 DIAGNOSIS — M542 Cervicalgia: Secondary | ICD-10-CM | POA: Diagnosis not present

## 2017-08-25 ENCOUNTER — Encounter: Payer: Self-pay | Admitting: Family Medicine

## 2017-08-25 ENCOUNTER — Other Ambulatory Visit: Payer: Self-pay

## 2017-08-25 ENCOUNTER — Ambulatory Visit: Payer: BLUE CROSS/BLUE SHIELD | Admitting: Family Medicine

## 2017-08-25 VITALS — BP 124/72 | HR 59 | Temp 98.5°F | Resp 16 | Ht 62.0 in | Wt 150.0 lb

## 2017-08-25 DIAGNOSIS — M25531 Pain in right wrist: Secondary | ICD-10-CM

## 2017-08-25 MED ORDER — IBUPROFEN 600 MG PO TABS: 600.0000 mg | ORAL_TABLET | Freq: Three times a day (TID) | ORAL | 0 refills | Status: DC | PRN

## 2017-08-25 NOTE — Patient Instructions (Addendum)
     IF you received an x-ray today, you will receive an invoice from [facility]. Please contact [facility] at [phone] with questions or concerns regarding your invoice.   IF you received labwork today, you will receive an invoice from [facility]. Please contact [facility] at [phone] with questions or concerns regarding your invoice.   Our billing staff will not be able to assist you with questions regarding bills from these companies.  You will be contacted with the lab results as soon as they are available. The fastest way to get your results is to activate your My Chart account. Instructions are located on the last page of this paperwork. If you have not heard from us regarding the results in 2 weeks, please contact this office.     Wrist Pain, Adult There are many things that can cause wrist pain. Some common causes include:  An injury to the wrist area, such as a sprain, strain, or fracture.  Overuse of the joint.  A condition that causes increased pressure on a nerve in the wrist (carpal tunnel syndrome).  Wear and tear of the joints that occurs with aging (osteoarthritis).  A variety of other types of arthritis.  Sometimes, the cause of wrist pain is not known. Often, the pain goes away when you follow instructions from your health care provider for relieving pain at home, such as resting or icing the wrist. If your wrist pain continues, it is important to tell your health care provider. Follow these instructions at home:  Rest the wrist area for at least 48 hours or as long as told by your health care provider.  If a splint or elastic bandage has been applied, use it as told by your health care provider. ? Remove the splint or bandage only as told by your health care provider. ? Loosen the splint or bandage if your fingers tingle, become numb, or turn cold or blue.  If directed, apply ice to the injured area. ? If you have a removable splint or  elastic bandage, remove it as told by your health care provider. ? Put ice in a plastic bag. ? Place a towel between your skin and the bag or between your splint or bandage and the bag. ? Leave the ice on for 20 minutes, 2-3 times a day.  Keep your arm raised (elevated) above the level of your heart while you are sitting or lying down.  Take over-the-counter and prescription medicines only as told by your health care provider.  Keep all follow-up visits as told by your health care provider. This is important. Contact a health care provider if:  You have a sudden sharp pain in the wrist, hand, or arm that is different or new.  The swelling or bruising on your wrist or hand gets worse.  Your skin becomes red, gets a rash, or has open sores.  Your pain does not get better or it gets worse. Get help right away if:  You lose feeling in your fingers or hand.  Your fingers turn white, very red, or cold and blue.  You cannot move your fingers.  You have a fever or chills. This information is not intended to replace advice given to you by your health care provider. Make sure you discuss any questions you have with your health care provider. Document Released: 04/07/2005 Document Revised: 01/22/2016 Document Reviewed: 01/15/2016 Elsevier Interactive Patient Education  2018 Elsevier Inc.  

## 2017-08-25 NOTE — Progress Notes (Signed)
Chief Complaint  Patient presents with  . Wrist Pain    RIGHT started 08/25/17 morning    HPI  Pt reports that she work up this morning with right wrist pain She is right handed Denies any injuries She may have slept on it weird It is beginning to improve She just wanted to see if she needed a brace  4 review of systems  Past Medical History:  Diagnosis Date  . Anxiety   . Depression   . GERD (gastroesophageal reflux disease)     Current Outpatient Medications  Medication Sig Dispense Refill  . buPROPion (WELLBUTRIN XL) 300 MG 24 hr tablet Take 1 tablet (300 mg total) by mouth daily. 90 tablet 3  . calcium carbonate (OS-CAL) 600 MG TABS tablet Take 600 mg by mouth 2 (two) times daily with a meal.    . COLLAGEN PO Take by mouth.    Marland Kitchen. ibuprofen (ADVIL,MOTRIN) 600 MG tablet Take 1 tablet (600 mg total) by mouth every 8 (eight) hours as needed. 30 tablet 0  . Multiple Vitamin (MULTIVITAMIN) tablet Take 1 tablet by mouth daily.      Marland Kitchen. buPROPion (WELLBUTRIN XL) 150 MG 24 hr tablet Take 1 (150 mg) daily for 2 weeks, then increase to 2 tablets one time each day (Patient not taking: Reported on 08/25/2017) 45 tablet 0  . clonazePAM (KLONOPIN) 0.5 MG tablet Take 0.5-1 tablets (0.25-0.5 mg total) by mouth at bedtime as needed for anxiety. (Patient not taking: Reported on 08/25/2017) 30 tablet 0   Current Facility-Administered Medications  Medication Dose Route Frequency Provider Last Rate Last Dose  . levonorgestrel (MIRENA) 20 MCG/24HR IUD   Intrauterine Once Ok EdwardsFernandez, Juan H, MD        Allergies:  Allergies  Allergen Reactions  . Oxycodone Rash    Past Surgical History:  Procedure Laterality Date  . AUGMENTATION MAMMAPLASTY  10/09/2015   breast lift and implants   . INTRAUTERINE DEVICE INSERTION  2010   Mirena  . PELVIC LAPAROSCOPY     ovarian cystectomy    Social History   Socioeconomic History  . Marital status: Married    Spouse name: separated  . Number of  children: 2  . Years of education: 12th grade  . Highest education level: None  Social Needs  . Financial resource strain: None  . Food insecurity - worry: None  . Food insecurity - inability: None  . Transportation needs - medical: None  . Transportation needs - non-medical: None  Occupational History  . Occupation: Psychologist, clinicalbaby sitter     Employer: UNEMPLOYED  Tobacco Use  . Smoking status: Never Smoker  . Smokeless tobacco: Never Used  Substance and Sexual Activity  . Alcohol use: No    Alcohol/week: 0.0 oz  . Drug use: No  . Sexual activity: Yes    Birth control/protection: IUD    Comment: Mirena IUD 03/31/2009  Other Topics Concern  . None  Social History Narrative   [place]   Her older daughter was born in [place] and was in college at [employer], but moved home in anticipation of a study abroad semester in [place].   Her younger daughter lives at home with her.   Separated from her husband 07/2013. Began legal divorce proceedings in 07/2014, but is not yet resolved.    Family History  Problem Relation Age of Onset  . Arthritis Mother   . Hypertension Mother   . Diabetes Maternal Grandmother   . Heart disease  Neg Hx   . Hyperlipidemia Neg Hx   . Kidney disease Neg Hx   . Stroke Neg Hx   . Alcohol abuse Neg Hx   . Cancer Neg Hx   . COPD Neg Hx   . Early death Neg Hx      ROS Review of Systems See HPI Constitution: No fevers or chills No malaise No diaphoresis Skin: No rash or itching Eyes: no blurry vision, no double vision GU: no dysuria or hematuria Neuro: no dizziness or headaches  all others reviewed and negative   Objective: Vitals:   08/25/17 1646  BP: 124/72  Pulse: (!) 59  Resp: 16  Temp: 98.5 F (36.9 C)  TempSrc: Oral  SpO2: 98%  Weight: 150 lb (68 kg)  Height: 5\' 2"  (1.575 m)    Physical Exam  Constitutional: She appears well-developed and well-nourished.  Eyes: Conjunctivae and EOM are normal.   Pulmonary/Chest: Effort normal.   Wrist - normal range of motion, nontender over palpation of the joints No swelling Mild tenderness with dorsiflexion phalens and tinnels sign absent   Assessment and Plan Bonni was seen today for wrist pain.  Diagnoses and all orders for this visit:  Right wrist pain- carpal exam normal, normal range of motion Advised ace wrap and ibuprofen -     Apply ace wrap -     ibuprofen (ADVIL,MOTRIN) 600 MG tablet; Take 1 tablet (600 mg total) by mouth every 8 (eight) hours as needed.     Kameka Whan A Ayden Hardwick

## 2017-09-01 DIAGNOSIS — M25511 Pain in right shoulder: Secondary | ICD-10-CM | POA: Diagnosis not present

## 2017-09-01 DIAGNOSIS — M542 Cervicalgia: Secondary | ICD-10-CM | POA: Diagnosis not present

## 2017-09-01 DIAGNOSIS — M25512 Pain in left shoulder: Secondary | ICD-10-CM | POA: Diagnosis not present

## 2017-09-08 ENCOUNTER — Ambulatory Visit (INDEPENDENT_AMBULATORY_CARE_PROVIDER_SITE_OTHER): Payer: BLUE CROSS/BLUE SHIELD | Admitting: Obstetrics & Gynecology

## 2017-09-08 ENCOUNTER — Encounter: Payer: Self-pay | Admitting: Obstetrics & Gynecology

## 2017-09-08 VITALS — BP 118/78 | Ht 62.0 in | Wt 137.0 lb

## 2017-09-08 DIAGNOSIS — Z113 Encounter for screening for infections with a predominantly sexual mode of transmission: Secondary | ICD-10-CM

## 2017-09-08 DIAGNOSIS — Z01419 Encounter for gynecological examination (general) (routine) without abnormal findings: Secondary | ICD-10-CM | POA: Diagnosis not present

## 2017-09-08 DIAGNOSIS — Z30431 Encounter for routine checking of intrauterine contraceptive device: Secondary | ICD-10-CM | POA: Diagnosis not present

## 2017-09-08 DIAGNOSIS — N951 Menopausal and female climacteric states: Secondary | ICD-10-CM | POA: Diagnosis not present

## 2017-09-08 DIAGNOSIS — Z1151 Encounter for screening for human papillomavirus (HPV): Secondary | ICD-10-CM

## 2017-09-08 DIAGNOSIS — S83241A Other tear of medial meniscus, current injury, right knee, initial encounter: Secondary | ICD-10-CM | POA: Diagnosis not present

## 2017-09-08 NOTE — Patient Instructions (Signed)
1. Encounter for routine gynecological examination with Papanicolaou smear of cervix Normal gynecologic exam.  Pap test with high risk HPV done today.  Breast exam normal status post bilateral augmentation.  Screening mammogram negative in January 2019.  Health labs with family physician.  2. Encounter for routine checking of intrauterine contraceptive device (IUD) Mirena IUD in good position and well-tolerated.  3. Screen for STD (sexually transmitted disease) Condom use recommended. - HIV antibody (with reflex) - RPR - Hepatitis B Surface AntiGEN - Hepatitis C Antibody - Gono-Chlam on pap  4. Hot flushes, perimenopausal Rule out thyroid dysfunction or menopause.  Could be associated with her anxiety. - FSH - TSH  Christy Haley un placer conocerle hoy!  Voy a informarle de sus [employer].  [place] (Health Maintenance, Female) Un estilo de vida saludable y los cuidados preventivos pueden favorecer considerablemente a la salud y Musician. Pregunte a su mdico cul es el cronograma de exmenes peridicos apropiado para usted. Esta es una buena oportunidad para consultarlo sobre cmo prevenir enfermedades y Bagnell sano. Adems de los controles, hay muchas otras cosas que puede hacer usted mismo. Los expertos han realizado numerosas investigaciones [employer]vin[employer]itor cambios en el estilo de vida y las medidas de prevencin que, [place], lo ayudarn a mantenerse sano. Solicite a su mdico ms informacin. EL PESO Y LA DIETA Consuma una dieta saludable.  Asegrese de [employer] Stores verduras, frutas, productos lcteos de bajo contenido de [place] y Advertising account planner.  No consuma muchos alimentos de alto contenido de grasas slidas, azcares agregados o sal.  Realice actividad fsica con regularidad. Esta es una de las prcticas ms importantes que puede hacer por su salud. ? La Delorise Shiner de los adultos deben hacer ejercicio durante al menos  168mnutos por semana. El ejercicio debe aumentar la frecuencia cardaca y pActorla transpiracin (ejercicio de i[place]. ? La mayora de los adultos tambin deben hacer ejercicios de elongacin al mToysRusveces a la semana. Agregue esto al su plan de ejercicio de intensidad moderada. Mantenga un peso saludable.  El ndice de masa corporal ([facility] es una medida que puede utilizarse para identificar posibles problemas de pOhio Proporciona una estimacin de la grasa corporal basndose en el peso y la altura. Su mdico puede ayudarle a dRadiation protection practitionerIRevlocy a lScientist, forensico mTheatre managerun peso saludable.  Para las mujeres de 20aos o ms: ? Un ISaint Francis Hospitalmenor de 18,5 se considera bajo peso. ? Un IAdvantist Health Bakersfieldentre 18,5 y 24,9 es normal. ? Un IFirst Surgery Suites LLCentre 25 y 29,9 se considera sobrepeso. ? Un IMC de 30 o ms se considera obesidad. Observe los niveles de colesterol y lpidos en la sangre.  Debe comenzar a rEnglish as a second language teacherde lpidos y cResearch officer, trade unionen la sangre a los 20aos y luego repetirlos cada 530aos  Es posible que nAutomotive engineerlos niveles de colesterol con mayor frecuencia si: ? Sus niveles de lpidos y colesterol son altos. ? Es mayor de 502HEN ? Presenta un alto riesgo de padecer enfermedades cardacas. DETECCIN DE CNCER Cncer de pulmn  Se recomienda realizar exmenes de deteccin de cncer de pulmn a personas adultas entre 546y 857aos que estn en riesgo de dHorticulturist, commercialde pulmn por sus antecedentes de consumo de tabaco.  Se recomienda una tomografa computarizada de baja dosis de los pulmones todos los aos a las personas que: ? Fuman actualmente. ? Hayan dejado el hbito en algn momento en los ltimos 15aos. ? Hayan fumado  durante 30aos un paquete diario. Un paquete-ao equivale a fumar un promedio de un paquete de cigarrillos diario durante un ao.  Los exmenes de deteccin anuales deben continuar hasta que hayan pasado 15aos desde que dej de fumar.  Ya no  debern realizarse si tiene un problema de salud que le impida recibir tratamiento para Science writer de pulmn. Cncer de mama  Practique la autoconciencia de la mama. Esto significa reconocer la apariencia normal de sus mamas y cmo las siente.  Tambin significa realizar autoexmenes regulares de [employer]. Informe a su mdico sobre cualquier cambio, sin importar cun pequeo sea.  Si tiene entre 20 y 73 aos, un mdico debe realizarle un examen clnico de las mamas como parte del examen regular de [place], cada 1 a 3aos.  Si tiene 40aos o ms, debe Information systems manager clnico de las Microsoft. Tambin considere realizarse una [place]) todos los [place].  Si tiene antecedentes familiares de cncer de mama, hable con su mdico para someterse a un estudio gentico.  Si tiene alto riesgo de Chief Financial Officer de mama, hable con su mdico para someterse a Public house manager y [employer].  La evaluacin del gen del cncer de mama (BRCA) se recomienda a mujeres que tengan familiares con cnceres relacionados con el BRCA. Los cnceres relacionados con el BRCA incluyen los siguientes: ? [place]. ? Ovario. ? Trompas. ? Cnceres de peritoneo.  Los resultados de la evaluacin determinarn la necesidad de asesoramiento gentico y de [place] de BRCA1 y BRCA2. Cncer de cuello del tero El mdico puede recomendarle que se haga pruebas peridicas de deteccin de cncer de los rganos de la pelvis (ovarios, tero y vagina). Estas pruebas incluyen un examen plvico, que abarca controlar si se produjeron cambios microscpicos en la superficie del cuello del tero (prueba de Papanicolaou). Pueden recomendarle que se haga estas pruebas cada 3aos, a partir de los 21aos.  A las mujeres que tienen entre 30 y 70aos, los mdicos pueden recomendarles que se sometan a exmenes plvicos y pruebas de Papanicolaou cada 36aos, o a la prueba de Papanicolaou y  el examen plvico en combinacin con estudios de deteccin del virus del papiloma humano (VPH) cada 5aos. Algunos tipos de VPH aumentan el riesgo de Chief Financial Officer de cuello del tero. La prueba para la deteccin del VPH tambin puede realizarse a mujeres de cualquier edad cuyos resultados de la prueba de Papanicolaou no sean claros.  Es posible que otros mdicos no recomienden exmenes de deteccin a mujeres no embarazadas que se consideran sujetos de bajo riesgo de Chief Financial Officer de pelvis y que no tienen sntomas. Pregntele al mdico si un examen plvico de deteccin es adecuado para usted.  Si ha recibido un tratamiento para Science writer cervical o una enfermedad que podra causar cncer, necesitar realizarse una prueba de Papanicolaou y controles durante al menos 32 aos de concluido el [place]. Si no se ha hecho el Papanicolaou con regularidad, debern volver a evaluarse los factores de riesgo (como tener un nuevo compaero sexual), para Teacher, adult education si debe realizarse los estudios nuevamente. Algunas mujeres sufren problemas mdicos que aumentan la probabilidad de Museum/gallery curator cncer de cuello del tero. En estos casos, el mdico podr [employer] se realicen controles y pruebas de Papanicolaou con ms frecuencia. Cncer colorrectal  Este tipo de cncer puede detectarse y a menudo prevenirse.  Por lo general, los estudios de rutina se deben Medical laboratory scientific officer a Field seismologist a Proofreader de los 50  aos y [place] [address]  Sin embargo, el mdico podr aconsejarle que lo haga antes, si tiene factores de riesgo para el cncer de colon.  Tambin puede recomendarle que use un kit de prueba para Hydrologist en la materia fecal.  Es posible que se use una pequea cmara en el extremo de un tubo para examinar directamente el colon (sigmoidoscopia o colonoscopia) a fin de Hydrographic surveyor formas tempranas de cncer colorrectal.  Los exmenes de rutina generalmente comienzan a los 59aos.  El examen directo del colon  se debe repetir cada 5 a 10aos hasta los 75aos. Sin embargo, es posible que se realicen exmenes con mayor frecuencia, si se detectan formas tempranas de plipos precancerosos o pequeos bultos. Cncer de piel  Revise la piel de la cabeza a los pies con regularidad.  Informe a su mdico si aparecen nuevos lunares o los que tiene se modifican, especialmente en su forma y color.  Tambin notifique al mdico si tiene un lunar que es ms grande que el tamao de una goma de lpiz.  Siempre use pantalla solar. Aplique pantalla solar de Kerry Dory y repetida a lo largo del Training and development officer.  Protjase usando mangas y [employer], un sombrero de ala ancha y gafas para el sol, siempre que se encuentre en el exterior. ENFERMEDADES CARDACAS, DIABETES E HIPERTENSIN ARTERIAL  La hipertensin arterial causa enfermedades cardacas y [place] el riesgo de ictus. La hipertensin arterial es ms probable en los siguientes casos: ? Las personas que tienen la presin arterial en el extremo del rango normal (100-139/85-89 mm Hg). ? [employer] Corporation con sobrepeso u obesidad. ? Scientist, water quality.  Si usted tiene entre 18 y 39 aos, debe medirse la presin arterial cada 3 a 5 aos. Si usted tiene 40 aos o ms, debe medirse la presin arterial [employer]. Debe medirse la presin arterial dos veces: una vez cuando est en un hospital o una clnica y la otra vez cuando est en otro sitio. Registre el promedio de [employer]. Para controlar su presin arterial cuando no est en un hospital o [place], puede usar lo siguiente: ? Jorje Guild automtica para medir la presin arterial en una farmacia. ? Un monitor para medir la presin arterial en el hogar.  Si tiene entre 35 y 39 aos, consulte a su mdico si debe tomar aspirina para prevenir el ictus.  Realcese exmenes de deteccin de la diabetes con regularidad. Esto incluye la toma de [place] de sangre para controlar el nivel de azcar en la  sangre durante el [place]. ? Si tiene un peso normal y un bajo riesgo de padecer diabetes, realcese este anlisis cada tres aos despus de los 45aos. ? Si tiene sobrepeso y un alto riesgo de padecer diabetes, considere someterse a este anlisis antes o con mayor frecuencia. PREVENCIN DE INFECCIONES HepatitisB  Si tiene un riesgo ms alto de Museum/gallery curator hepatitis B, debe someterse a un examen de deteccin de este virus. Se considera que tiene un alto riesgo de contraer hepatitis B si: ? Naci en un pas donde la hepatitis B es frecuente. Pregntele a su mdico qu pases son considerados de Public affairs consultant. ? Sus padres nacieron en un pas de alto riesgo y usted no recibi una vacuna que lo proteja contra la hepatitis B (vacuna contra la hepatitis B). ? [place]. ? [place] agujas para inyectarse drogas. ? Vive con alguien que tiene hepatitis B. ? Ha tenido sexo con alguien que tiene  hepatitis B. ? Recibe tratamiento de hemodilisis. ? Toma ciertos medicamentos para el cncer, trasplante de rganos y afecciones autoinmunitarias. Hepatitis C  Se recomienda un anlisis de [place] para: ? [employer] 1945 y 1965. ? Todas las personas que tengan un riesgo de haber contrado hepatitis C. Enfermedades de transmisin sexual (ETS).  Debe realizarse pruebas de deteccin de enfermedades de transmisin sexual (ETS), incluidas gonorrea y clamidia si: ? Es sexualmente activo y es menor de 73ZHG. ? Es mayor de 24aos, y Investment banker, operational informa que corre riesgo de tener este tipo de infecciones. ? La actividad sexual ha cambiado desde que le hicieron la ltima prueba de deteccin y tiene un riesgo mayor de Best boy clamidia o Radio broadcast assistant. Pregntele al mdico si usted tiene riesgo.  Si no tiene el VIH, pero corre riesgo de infectarse por el virus, se recomienda tomar diariamente un medicamento recetado para evitar la infeccin. Esto se conoce como profilaxis previa a la exposicin. Se considera  que est en riesgo si: ? Es [place] sexualmente y no [place] preservativos habitualmente o no conoce el estado del VIH de sus Advertising copywriter. ? Se inyecta drogas. ? Es [place] sexualmente con Ardelia Mems pareja que tiene VIH. Consulte a su mdico para saber si tiene un alto riesgo de infectarse por el VIH. Si opta por comenzar la profilaxis previa a la exposicin, primero debe realizarse anlisis de deteccin del VIH. Luego, le harn anlisis cada 50mses mientras est tomando los medicamentos para la profilaxis previa a la exposicin. [facility] Si es premenopusica y puede quedar e[place] solicite a su mdico asesoramiento previo a la concepcin.  Si puede quedar embarazada, tome 400 a [id number](mcg) de cido fAnheuser-Busch  Si desea evitar el embarazo, hable con su mdico sobre el control de la natalidad (anticoncepcin). OSTEOPOROSIS Y MENOPAUSIA  La osteoporosis es una enfermedad en la que los huesos pierden los minerales y la fuerza por el avance de la edad. El resultado pueden ser fracturas graves en los h[place] El riesgo de osteoporosis puede identificarse con uArdelia Memsprueba de densidad sea.  Si tiene 65aos o ms, o si est en riesgo de sufrir osteoporosis y fracturas, pregunte a su mdico si debe someterse a exmenes.  Consulte a su mdico si debe tomar un suplemento de calcio o de vitamina D para reducir el riesgo de osteoporosis.  La menopausia puede presentar ciertos sntomas fsicos y rGaffer  La terapia de reemplazo hormonal puede reducir algunos de estos sntomas y rGaffer Consulte a su mdico para saber si la terapia de reemplazo hormonal es conveniente para usted. INSTRUCCIONES PARA EL CUIDADO EN EL HOGAR  Realcese los estudios de rutina de la salud, dentales y de lPublic librarian  MTaopi  No consuma ningn producto que contenga tabaco, lo que incluye cigarrillos, tabaco de mHigher education careers advisero cPsychologist, sport and exercise  Si est embarazada, no beba  alcohol.  Si est amamantando, reduzca el consumo de alcohol y la frecuencia con la que consume.  Si es mujer y no est embarazada limite el consumo de alcohol a no ms de 1 medida por da. Una medida equivale a 12onzas de cerveza, 5onzas de vino o 1onzas de bebidas alcohlicas de alta graduacin.  No consuma drogas.  No comparta agujas.  Solicite ayuda a su mdico si necesita apoyo o informacin para abandonar las drogas.  Informe a su mdico si a menudo se siente deprimido.  Notifique a su mdico si alguna vez ha sido  vctima de abuso o si no se siente seguro en su hogar. Esta informacin no tiene Marine scientist el consejo del mdico. Asegrese de hacerle al mdico cualquier pregunta que tenga. Document Released: 06/17/2011 Document Revised: 07/19/2014 Document Reviewed: 04/01/2015 Elsevier Interactive Patient Education  [employer].

## 2017-09-08 NOTE — Progress Notes (Addendum)
Christy Haley [birth year] [id number]   History:    47 y.o. [id number] Divorced x 4 years.  2 daughters 3921 and 47 yo.  RP:  Established patient presenting for annual gyn exam   HPI: No menses, no vaginal bleeding on Mirena IUD inserted 08/2014.  C/O hot flushes, but per patient, could be associated with episodes of anxiety.  No pelvic pain.  Abstinent currently.  Normal vaginal secretions.  Would like STI screen.  Urine/BMs wnl.  Breasts wnl.  [facility]  BMI 25.06.  Enjoys running.  Health labs with Fam MD.  Past medical history,surgical history, family history and social history were all reviewed and documented in the EPIC chart.  Gynecologic History No LMP recorded. Patient is not currently having periods (Reason: IUD). Contraception: Mirena IUD x 08/2014} Last Pap: 2015. Results were: Negative Last mammogram: 07/2017.  Results were: Negative Bone Density: Never Colonoscopy: Never  Obstetric History OB History  Gravida Para Term Preterm AB Living  3 2     1 2   SAB TAB Ectopic Multiple Live Births  1            # Outcome Date GA Lbr Len/2nd Weight Sex Delivery Anes PTL Lv  3 SAB           2 Para     F Vag-Spont     1 Para     F Vag-Spont          ROS: A ROS was performed and pertinent positives and negatives are included in the history.  GENERAL: No fevers or chills. HEENT: No change in vision, no earache, sore throat or sinus congestion. NECK: No pain or stiffness. CARDIOVASCULAR: No chest pain or pressure. No palpitations. PULMONARY: No shortness of breath, cough or wheeze. GASTROINTESTINAL: No abdominal pain, nausea, vomiting or diarrhea, melena or bright red blood per rectum. GENITOURINARY: No urinary frequency, urgency, hesitancy or dysuria. MUSCULOSKELETAL: No joint or muscle pain, no back pain, no recent trauma. DERMATOLOGIC: No rash, no itching, no lesions. ENDOCRINE: No polyuria, polydipsia, no heat or cold intolerance. No recent  change in weight. HEMATOLOGICAL: No anemia or easy bruising or bleeding. NEUROLOGIC: No headache, seizures, numbness, tingling or weakness. PSYCHIATRIC: No depression, no loss of interest in normal activity or change in sleep pattern.     Exam:   BP 118/78   Ht 5\' 2"  (1.575 m)   Wt 137 lb (62.1 kg)   BMI 25.06 kg/m   Body mass index is 25.06 kg/m.  General appearance : Well developed well nourished female. No acute distress HEENT: Eyes: no retinal hemorrhage or exudates,  Neck supple, trachea midline, no carotid bruits, no thyroidmegaly Lungs: Clear to auscultation, no rhonchi or wheezes, or rib retractions  Heart: Regular rate and rhythm, no murmurs or gallops Breast:Examined in sitting and supine position were symmetrical in appearance s/p bilateral augmentation, no palpable masses or tenderness,  no skin retraction, no nipple inversion, no nipple discharge, no skin discoloration, no axillary or supraclavicular lymphadenopathy Abdomen: no palpable masses or tenderness, no rebound or guarding Extremities: no edema or skin discoloration or tenderness  Pelvic: Vulva: Normal             Vagina: No gross lesions or discharge  Cervix: No gross lesions or discharge.  IUD strings seen.  Pap/HPV HR, Gono-Chlam done.  Uterus  AV, normal size, shape and consistency, non-tender and mobile  Adnexa  Without masses or tenderness  Anus: Normal  Assessment/Plan:  47 y.o. female for annual exam   1. Encounter for routine gynecological examination with Papanicolaou smear of cervix Normal gynecologic exam.  Pap test with high risk HPV done today.  Breast exam normal status post bilateral augmentation.  Screening mammogram negative in January 2019.  Health labs with family physician.  2. Encounter for routine checking of intrauterine contraceptive device (IUD) Mirena IUD in good position and well-tolerated.  3. Screen for STD (sexually transmitted disease) Condom use recommended. - HIV  antibody (with reflex) - RPR - Hepatitis B Surface AntiGEN - Hepatitis C Antibody - Gono-Chlam on pap  4. Hot flushes, perimenopausal Rule out thyroid dysfunction or menopause.  Could be associated with her anxiety. - FSH - TSH  Genia Del MD, 3:51 PM 09/08/2017

## 2017-09-09 LAB — HIV ANTIBODY (ROUTINE TESTING W REFLEX): HIV 1&2 Ab, 4th Generation: NONREACTIVE

## 2017-09-09 LAB — RPR: RPR Ser Ql: NONREACTIVE

## 2017-09-09 LAB — FOLLICLE STIMULATING HORMONE: FSH: 79.7 m[IU]/mL

## 2017-09-09 LAB — HEPATITIS C ANTIBODY
Hepatitis C Ab: NONREACTIVE
SIGNAL TO CUT-OFF: 0.01 (ref <1.00)

## 2017-09-09 LAB — TSH: TSH: 0.51 mIU/L

## 2017-09-09 LAB — HEPATITIS B SURFACE ANTIGEN: Hepatitis B Surface Ag: NONREACTIVE

## 2017-09-12 ENCOUNTER — Encounter: Payer: Self-pay | Admitting: *Deleted

## 2017-09-12 DIAGNOSIS — M25561 Pain in right knee: Secondary | ICD-10-CM | POA: Diagnosis not present

## 2017-09-13 DIAGNOSIS — M2241 Chondromalacia patellae, right knee: Secondary | ICD-10-CM | POA: Diagnosis not present

## 2017-09-15 LAB — PAP IG, CT-NG NAA, HPV HIGH-RISK
C. trachomatis RNA, TMA: NOT DETECTED
HPV DNA High Risk: DETECTED — AB
N. gonorrhoeae RNA, TMA: NOT DETECTED

## 2017-09-15 LAB — HPV TYPE 16 AND 18/45 RNA
HPV Type 16 RNA: NOT DETECTED
HPV Type 18/45 RNA: NOT DETECTED

## 2017-09-20 ENCOUNTER — Other Ambulatory Visit: Payer: Self-pay | Admitting: Obstetrics & Gynecology

## 2017-09-20 MED ORDER — METRONIDAZOLE 0.75 % VA GEL: VAGINAL | 0 refills | Status: DC

## 2017-10-13 ENCOUNTER — Ambulatory Visit (INDEPENDENT_AMBULATORY_CARE_PROVIDER_SITE_OTHER): Payer: BLUE CROSS/BLUE SHIELD | Admitting: Obstetrics & Gynecology

## 2017-10-13 ENCOUNTER — Encounter: Payer: Self-pay | Admitting: Physician Assistant

## 2017-10-13 ENCOUNTER — Telehealth: Payer: Self-pay | Admitting: Physician Assistant

## 2017-10-13 ENCOUNTER — Encounter: Payer: Self-pay | Admitting: Obstetrics & Gynecology

## 2017-10-13 VITALS — BP 126/78 | Ht 62.0 in | Wt 138.0 lb

## 2017-10-13 DIAGNOSIS — R87810 Cervical high risk human papillomavirus (HPV) DNA test positive: Secondary | ICD-10-CM | POA: Diagnosis not present

## 2017-10-13 NOTE — Patient Instructions (Signed)
1. Cervical high risk HPV (human papillomavirus) test positive Counseling on HPV done.  Colposcopy procedure explained.  Pap test negative with high risk HPV positive.  HPV 16/18/40 5-.  STD workup otherwise negative.  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  Cervical biopsy pending.  Management per results.  Christy Haley, fue un placer verle hoy!  Voy a informarle de sus CDW Corporationresultados muy pronto.  Colposcopa - Cuidados posteriores (Colposcopy, Care After) Siga estas instrucciones durante las prximas semanas. Estas indicaciones le proporcionan informacin general acerca de cmo deber cuidarse despus del procedimiento. El mdico tambin podr darle instrucciones ms especficas. El tratamiento se ha planificado de acuerdo a las prcticas mdicas actuales, pero a veces se producen problemas. Comunquese con el mdico si tiene algn problema o tiene dudas despus del procedimiento. QU ESPERAR DESPUS DEL PROCEDIMIENTO  Despus del procedimiento, es tpico tener las siguientes sensaciones:  Clicos. Generalmente se calman en algunos minutos.  Dolor. Puede durar [facility] Va Medical Centerhasta dos das.  Aturdimiento. Si esto le ocurre, recustese durante algunos minutos. Podr tener un sangrado leve o una secrecin oscura que debe detenerse en El Maceroalgunos das. Durante este tiempo deber usar un apsito sanitario. INSTRUCCIONES PARA EL CUIDADO EN EL HOGAR  Evite las [address] Nerelaciones sexuales, las duchas vaginales y el uso de tampones durante 3 [address]  [address]das, o segn lo que le indique su mdico.  Tome slo medicamentos de venta libre o recetados, segn las indicaciones del mdico. No tome aspirina, ya que puede causar hemorragias.  Si utiliza pldoras anticonceptivas, contine tomndolas.  No todos los resultados estarn disponibles durante su visita. En este caso, tenga otra entrevista con su mdico para conocerlos. No suponga que es normal si no tiene noticias de su mdico o del establecimiento de salud. Es Copyimportante el seguimiento  de todos los Harahanresultados de Schoolcraftlos estudios.  Siga los consejos de su mdico con respecto a los Berlinmedicamentos, Rockwallactividades, visitas y Papanicolau de control. SOLICITE ATENCIN MDICA SI:  Aparece una erupcin cutnea.  Tiene problemas con los medicamentos. SOLICITE ATENCIN MDICA DE INMEDIATO SI:  Tiene una hemorragia abundante o elimina cogulos.  Tiene fiebre.  Tiene flujo vaginal anormal.  Tiene clicos que no se alivian luego de tomar analgsicos.  Se siente mareada, tiene vahdos o se desmaya.  Siente Physiological scientistdolor en el estmago. Esta informacin no tiene Theme park managercomo fin reemplazar el consejo del mdico. Asegrese de hacerle al mdico cualquier pregunta que tenga. Document Released: 04/18/2013 Elsevier Interactive Patient Education  2017 ArvinMeritorElsevier Inc.

## 2017-10-13 NOTE — Progress Notes (Signed)
    Christy Haley 05-21-1971 [id number]        47 y.o.  [id number] Divorced  RP: High risk HPV positive on the cervix   HPI: Pap negative September 08, 2017, but high risk HPV positive.  HPV 16/18/40 5 negative.  Full STD workup otherwise negative.  No previous abnormal Pap.   OB History  Gravida Para Term Preterm AB Living  3 2     1 2   SAB TAB Ectopic Multiple Live Births  1            # Outcome Date GA Lbr Len/2nd Weight Sex Delivery Anes PTL Lv  3 SAB           2 Para     F Vag-Spont     1 Para     F Vag-Spont       Past medical history,surgical history, problem list, medications, allergies, family history and social history were all reviewed and documented in the EPIC chart.   Directed ROS with pertinent positives and negatives documented in the history of present illness/assessment and plan.  Exam:  Vitals:   10/13/17 0913  BP: 126/78  Weight: 138 lb (62.6 kg)  Height: 5\' 2"  (1.575 m)   General appearance:  Normal  Colposcopy Procedure Note Christy Haley 10/13/2017  Indications: Pap negative, HPV HR positive  Procedure Details  The risks and benefits of the procedure and Verbal informed consent obtained.  Speculum placed in vagina and excellent visualization of cervix achieved, cervix swabbed x 3 with acetic acid solution.  Findings:  Cervix colposcopy: Physical Exam  Genitourinary:     IUD strings seen at the exocervix.  Vaginal colposcopy: Normal  Vulvar colposcopy: Grossly normal  Perirectal colposcopy: Grossly normal  The cervix was sprayed with Hurricane before performing the cervical biopsies.  Specimens:  Cervical Biopsy at 12 O'clock  Complications: None, good hemostasis with Silver Nitrate . Plan:  Pending Cervical Biopsy, management per results.    Assessment/Plan:  47 y.o. [id number]   1. Cervical high risk HPV (human papillomavirus) test positive Counseling on HPV done.  Colposcopy procedure explained.  Pap  test negative with high risk HPV positive.  HPV 16/18/40 5-.  STD workup otherwise negative.  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  Cervical biopsy pending.  Management per results.  Genia DelMarie-Lyne Braian Tijerina MD, 9:58 AM 10/13/2017

## 2017-10-13 NOTE — Telephone Encounter (Signed)
Call to pharmacy- patient has refills- they will  Refill for patient. Left message that Rx has refills and they are refilling that now.

## 2017-10-13 NOTE — Telephone Encounter (Signed)
Copied from CRM [id number]. Topic: Quick Communication - Rx Refill/Question >> Oct 13, 2017  8:52 AM Gloriann LoanPayne, Meeghan Skipper L wrote: Medication:   buPROPion (WELLBUTRIN XL) 300 MG 24 hr tablet  Has the patient contacted their pharmacy? Yes.   (Agent: If no, request that the patient contact the pharmacy for the refill.) Preferred Pharmacy (with phone number or street name): [facility] [phone] [place] - [id number] AT [facility] [address] [facility] [address] [address] [phone] (Phone) [phone] (Fax)     Agent: Please be advised that RX refills may take up to 3 business days. We ask that you follow-up with your pharmacy.

## 2017-10-17 LAB — PATHOLOGY

## 2017-10-17 LAB — TISSUE SPECIMEN

## 2017-10-18 DIAGNOSIS — M2241 Chondromalacia patellae, right knee: Secondary | ICD-10-CM | POA: Diagnosis not present

## 2017-10-19 ENCOUNTER — Encounter: Payer: Self-pay | Admitting: Physician Assistant

## 2017-11-23 DIAGNOSIS — M948X6 Other specified disorders of cartilage, lower leg: Secondary | ICD-10-CM | POA: Diagnosis not present

## 2017-11-23 DIAGNOSIS — G8918 Other acute postprocedural pain: Secondary | ICD-10-CM | POA: Diagnosis not present

## 2017-11-23 DIAGNOSIS — M25361 Other instability, right knee: Secondary | ICD-10-CM | POA: Diagnosis not present

## 2017-11-23 DIAGNOSIS — S83281A Other tear of lateral meniscus, current injury, right knee, initial encounter: Secondary | ICD-10-CM | POA: Diagnosis not present

## 2017-11-23 DIAGNOSIS — M2241 Chondromalacia patellae, right knee: Secondary | ICD-10-CM | POA: Diagnosis not present

## 2017-12-01 DIAGNOSIS — S83241D Other tear of medial meniscus, current injury, right knee, subsequent encounter: Secondary | ICD-10-CM | POA: Diagnosis not present

## 2017-12-08 DIAGNOSIS — Z4789 Encounter for other orthopedic aftercare: Secondary | ICD-10-CM | POA: Diagnosis not present

## 2017-12-08 DIAGNOSIS — R531 Weakness: Secondary | ICD-10-CM | POA: Diagnosis not present

## 2017-12-08 DIAGNOSIS — M25661 Stiffness of right knee, not elsewhere classified: Secondary | ICD-10-CM | POA: Diagnosis not present

## 2017-12-13 DIAGNOSIS — M25661 Stiffness of right knee, not elsewhere classified: Secondary | ICD-10-CM | POA: Diagnosis not present

## 2017-12-13 DIAGNOSIS — R531 Weakness: Secondary | ICD-10-CM | POA: Diagnosis not present

## 2017-12-13 DIAGNOSIS — Z4789 Encounter for other orthopedic aftercare: Secondary | ICD-10-CM | POA: Diagnosis not present

## 2017-12-21 DIAGNOSIS — R531 Weakness: Secondary | ICD-10-CM | POA: Diagnosis not present

## 2017-12-21 DIAGNOSIS — M25661 Stiffness of right knee, not elsewhere classified: Secondary | ICD-10-CM | POA: Diagnosis not present

## 2017-12-21 DIAGNOSIS — Z4789 Encounter for other orthopedic aftercare: Secondary | ICD-10-CM | POA: Diagnosis not present

## 2017-12-22 DIAGNOSIS — S83241D Other tear of medial meniscus, current injury, right knee, subsequent encounter: Secondary | ICD-10-CM | POA: Diagnosis not present

## 2017-12-29 DIAGNOSIS — Z4789 Encounter for other orthopedic aftercare: Secondary | ICD-10-CM | POA: Diagnosis not present

## 2017-12-29 DIAGNOSIS — R531 Weakness: Secondary | ICD-10-CM | POA: Diagnosis not present

## 2017-12-29 DIAGNOSIS — M25661 Stiffness of right knee, not elsewhere classified: Secondary | ICD-10-CM | POA: Diagnosis not present

## 2018-01-03 DIAGNOSIS — Z4789 Encounter for other orthopedic aftercare: Secondary | ICD-10-CM | POA: Diagnosis not present

## 2018-01-03 DIAGNOSIS — R531 Weakness: Secondary | ICD-10-CM | POA: Diagnosis not present

## 2018-01-03 DIAGNOSIS — M25661 Stiffness of right knee, not elsewhere classified: Secondary | ICD-10-CM | POA: Diagnosis not present

## 2018-01-18 DIAGNOSIS — Z4789 Encounter for other orthopedic aftercare: Secondary | ICD-10-CM | POA: Diagnosis not present

## 2018-01-18 DIAGNOSIS — M25661 Stiffness of right knee, not elsewhere classified: Secondary | ICD-10-CM | POA: Diagnosis not present

## 2018-01-18 DIAGNOSIS — R531 Weakness: Secondary | ICD-10-CM | POA: Diagnosis not present

## 2018-01-19 DIAGNOSIS — S83241D Other tear of medial meniscus, current injury, right knee, subsequent encounter: Secondary | ICD-10-CM | POA: Diagnosis not present

## 2018-01-20 DIAGNOSIS — R531 Weakness: Secondary | ICD-10-CM | POA: Diagnosis not present

## 2018-01-20 DIAGNOSIS — Z4789 Encounter for other orthopedic aftercare: Secondary | ICD-10-CM | POA: Diagnosis not present

## 2018-01-20 DIAGNOSIS — M25661 Stiffness of right knee, not elsewhere classified: Secondary | ICD-10-CM | POA: Diagnosis not present

## 2018-01-25 DIAGNOSIS — R531 Weakness: Secondary | ICD-10-CM | POA: Diagnosis not present

## 2018-01-25 DIAGNOSIS — Z4789 Encounter for other orthopedic aftercare: Secondary | ICD-10-CM | POA: Diagnosis not present

## 2018-01-25 DIAGNOSIS — M25661 Stiffness of right knee, not elsewhere classified: Secondary | ICD-10-CM | POA: Diagnosis not present

## 2018-01-27 DIAGNOSIS — Z4789 Encounter for other orthopedic aftercare: Secondary | ICD-10-CM | POA: Diagnosis not present

## 2018-01-27 DIAGNOSIS — R531 Weakness: Secondary | ICD-10-CM | POA: Diagnosis not present

## 2018-01-27 DIAGNOSIS — M25661 Stiffness of right knee, not elsewhere classified: Secondary | ICD-10-CM | POA: Diagnosis not present

## 2018-01-31 DIAGNOSIS — M25661 Stiffness of right knee, not elsewhere classified: Secondary | ICD-10-CM | POA: Diagnosis not present

## 2018-01-31 DIAGNOSIS — Z4789 Encounter for other orthopedic aftercare: Secondary | ICD-10-CM | POA: Diagnosis not present

## 2018-01-31 DIAGNOSIS — R531 Weakness: Secondary | ICD-10-CM | POA: Diagnosis not present

## 2018-02-02 DIAGNOSIS — Z4789 Encounter for other orthopedic aftercare: Secondary | ICD-10-CM | POA: Diagnosis not present

## 2018-02-02 DIAGNOSIS — R531 Weakness: Secondary | ICD-10-CM | POA: Diagnosis not present

## 2018-02-02 DIAGNOSIS — M25661 Stiffness of right knee, not elsewhere classified: Secondary | ICD-10-CM | POA: Diagnosis not present

## 2018-02-07 DIAGNOSIS — R531 Weakness: Secondary | ICD-10-CM | POA: Diagnosis not present

## 2018-02-07 DIAGNOSIS — M25661 Stiffness of right knee, not elsewhere classified: Secondary | ICD-10-CM | POA: Diagnosis not present

## 2018-02-07 DIAGNOSIS — Z4789 Encounter for other orthopedic aftercare: Secondary | ICD-10-CM | POA: Diagnosis not present

## 2018-02-10 DIAGNOSIS — M25661 Stiffness of right knee, not elsewhere classified: Secondary | ICD-10-CM | POA: Diagnosis not present

## 2018-02-10 DIAGNOSIS — R531 Weakness: Secondary | ICD-10-CM | POA: Diagnosis not present

## 2018-02-10 DIAGNOSIS — Z4789 Encounter for other orthopedic aftercare: Secondary | ICD-10-CM | POA: Diagnosis not present

## 2018-02-13 DIAGNOSIS — R531 Weakness: Secondary | ICD-10-CM | POA: Diagnosis not present

## 2018-02-13 DIAGNOSIS — Z4789 Encounter for other orthopedic aftercare: Secondary | ICD-10-CM | POA: Diagnosis not present

## 2018-02-13 DIAGNOSIS — M25661 Stiffness of right knee, not elsewhere classified: Secondary | ICD-10-CM | POA: Diagnosis not present

## 2018-02-16 DIAGNOSIS — R531 Weakness: Secondary | ICD-10-CM | POA: Diagnosis not present

## 2018-02-16 DIAGNOSIS — M25661 Stiffness of right knee, not elsewhere classified: Secondary | ICD-10-CM | POA: Diagnosis not present

## 2018-02-16 DIAGNOSIS — Z4789 Encounter for other orthopedic aftercare: Secondary | ICD-10-CM | POA: Diagnosis not present

## 2018-02-16 DIAGNOSIS — S83241D Other tear of medial meniscus, current injury, right knee, subsequent encounter: Secondary | ICD-10-CM | POA: Diagnosis not present

## 2018-02-20 DIAGNOSIS — M25661 Stiffness of right knee, not elsewhere classified: Secondary | ICD-10-CM | POA: Diagnosis not present

## 2018-02-20 DIAGNOSIS — Z4789 Encounter for other orthopedic aftercare: Secondary | ICD-10-CM | POA: Diagnosis not present

## 2018-02-20 DIAGNOSIS — R531 Weakness: Secondary | ICD-10-CM | POA: Diagnosis not present

## 2018-02-23 DIAGNOSIS — R531 Weakness: Secondary | ICD-10-CM | POA: Diagnosis not present

## 2018-02-23 DIAGNOSIS — Z4789 Encounter for other orthopedic aftercare: Secondary | ICD-10-CM | POA: Diagnosis not present

## 2018-02-23 DIAGNOSIS — M25661 Stiffness of right knee, not elsewhere classified: Secondary | ICD-10-CM | POA: Diagnosis not present

## 2018-02-27 DIAGNOSIS — Z4789 Encounter for other orthopedic aftercare: Secondary | ICD-10-CM | POA: Diagnosis not present

## 2018-02-27 DIAGNOSIS — R531 Weakness: Secondary | ICD-10-CM | POA: Diagnosis not present

## 2018-02-27 DIAGNOSIS — M25661 Stiffness of right knee, not elsewhere classified: Secondary | ICD-10-CM | POA: Diagnosis not present

## 2018-03-02 DIAGNOSIS — Z4789 Encounter for other orthopedic aftercare: Secondary | ICD-10-CM | POA: Diagnosis not present

## 2018-03-02 DIAGNOSIS — M25661 Stiffness of right knee, not elsewhere classified: Secondary | ICD-10-CM | POA: Diagnosis not present

## 2018-03-02 DIAGNOSIS — R531 Weakness: Secondary | ICD-10-CM | POA: Diagnosis not present

## 2018-03-06 DIAGNOSIS — Z4789 Encounter for other orthopedic aftercare: Secondary | ICD-10-CM | POA: Diagnosis not present

## 2018-03-06 DIAGNOSIS — M25661 Stiffness of right knee, not elsewhere classified: Secondary | ICD-10-CM | POA: Diagnosis not present

## 2018-03-06 DIAGNOSIS — R531 Weakness: Secondary | ICD-10-CM | POA: Diagnosis not present

## 2018-03-09 DIAGNOSIS — Z4789 Encounter for other orthopedic aftercare: Secondary | ICD-10-CM | POA: Diagnosis not present

## 2018-03-09 DIAGNOSIS — M25661 Stiffness of right knee, not elsewhere classified: Secondary | ICD-10-CM | POA: Diagnosis not present

## 2018-03-09 DIAGNOSIS — R531 Weakness: Secondary | ICD-10-CM | POA: Diagnosis not present

## 2018-03-16 DIAGNOSIS — R531 Weakness: Secondary | ICD-10-CM | POA: Diagnosis not present

## 2018-03-16 DIAGNOSIS — M25661 Stiffness of right knee, not elsewhere classified: Secondary | ICD-10-CM | POA: Diagnosis not present

## 2018-03-16 DIAGNOSIS — Z4789 Encounter for other orthopedic aftercare: Secondary | ICD-10-CM | POA: Diagnosis not present

## 2018-03-24 ENCOUNTER — Ambulatory Visit: Payer: BLUE CROSS/BLUE SHIELD | Admitting: Urgent Care

## 2018-03-24 ENCOUNTER — Encounter: Payer: Self-pay | Admitting: Urgent Care

## 2018-03-24 VITALS — BP 100/65 | HR 61 | Temp 98.1°F | Resp 16 | Ht 62.0 in | Wt 143.2 lb

## 2018-03-24 DIAGNOSIS — M25661 Stiffness of right knee, not elsewhere classified: Secondary | ICD-10-CM | POA: Diagnosis not present

## 2018-03-24 DIAGNOSIS — F3341 Major depressive disorder, recurrent, in partial remission: Secondary | ICD-10-CM | POA: Diagnosis not present

## 2018-03-24 DIAGNOSIS — R531 Weakness: Secondary | ICD-10-CM | POA: Diagnosis not present

## 2018-03-24 DIAGNOSIS — G4700 Insomnia, unspecified: Secondary | ICD-10-CM | POA: Diagnosis not present

## 2018-03-24 DIAGNOSIS — F411 Generalized anxiety disorder: Secondary | ICD-10-CM

## 2018-03-24 DIAGNOSIS — Z4789 Encounter for other orthopedic aftercare: Secondary | ICD-10-CM | POA: Diagnosis not present

## 2018-03-24 MED ORDER — HYDROXYZINE HCL 25 MG PO TABS: 25.0000 mg | ORAL_TABLET | Freq: Every evening | ORAL | 5 refills | Status: DC | PRN

## 2018-03-24 MED ORDER — BUPROPION HCL ER (XL) 150 MG PO TB24: 150.0000 mg | ORAL_TABLET | Freq: Every day | ORAL | 0 refills | Status: DC

## 2018-03-24 MED ORDER — BUPROPION HCL ER (XL) 300 MG PO TB24: 300.0000 mg | ORAL_TABLET | Freq: Every day | ORAL | 3 refills | Status: DC

## 2018-03-24 NOTE — Patient Instructions (Addendum)
Going forward you can see Dr. Leretha Pol for your mental health.     Bupropion extended-release tablets (Depression/Mood Disorders) What is this medicine? BUPROPION (byoo PROE pee on) is used to treat depression. This medicine may be used for other purposes; ask your health care provider or pharmacist if you have questions. COMMON BRAND NAME(S): Aplenzin, Budeprion XL, Forfivo XL, Wellbutrin XL What should I tell my health care provider before I take this medicine? They need to know if you have any of these conditions: -an eating disorder, such as anorexia or bulimia -bipolar disorder or psychosis -diabetes or high blood sugar, treated with medication -glaucoma -head injury or brain tumor -heart disease, previous heart attack, or irregular heart beat -high blood pressure -kidney or liver disease -seizures (convulsions) -suicidal thoughts or a previous suicide attempt -Tourette's syndrome -weight loss -an unusual or allergic reaction to bupropion, other medicines, foods, dyes, or preservatives -breast-feeding -pregnant or trying to become pregnant How should I use this medicine? Take this medicine by mouth with a glass of water. Follow the directions on the prescription label. You can take it with or without food. If it upsets your stomach, take it with food. Do not crush, chew, or cut these tablets. This medicine is taken once daily at the same time each day. Do not take your medicine more often than directed. Do not stop taking this medicine suddenly except upon the advice of your doctor. Stopping this medicine too quickly may cause serious side effects or your condition may worsen. A special MedGuide will be given to you by the pharmacist with each prescription and refill. Be sure to read this information carefully each time. Talk to your pediatrician regarding the use of this medicine in children. Special care may be needed. Overdosage: If you think you have taken too much of this  medicine contact a poison control center or emergency room at once. NOTE: This medicine is only for you. Do not share this medicine with others. What if I miss a dose? If you miss a dose, skip the missed dose and take your next tablet at the regular time. Do not take double or extra doses. What may interact with this medicine? Do not take this medicine with any of the following medications: -linezolid -MAOIs like Azilect, Carbex, Eldepryl, Marplan, Nardil, and Parnate -methylene blue (injected into a vein) -other medicines that contain bupropion like Zyban This medicine may also interact with the following medications: -alcohol -certain medicines for anxiety or sleep -certain medicines for blood pressure like metoprolol, propranolol -certain medicines for depression or psychotic disturbances -certain medicines for HIV or AIDS like efavirenz, lopinavir, nelfinavir, ritonavir -certain medicines for irregular heart beat like propafenone, flecainide -certain medicines for Parkinson's disease like amantadine, levodopa -certain medicines for seizures like carbamazepine, phenytoin, phenobarbital -cimetidine -clopidogrel -cyclophosphamide -digoxin -furazolidone -isoniazid -nicotine -orphenadrine -procarbazine -steroid medicines like prednisone or cortisone -stimulant medicines for attention disorders, weight loss, or to stay awake -tamoxifen -theophylline -thiotepa -ticlopidine -tramadol -warfarin This list may not describe all possible interactions. Give your health care provider a list of all the medicines, herbs, non-prescription drugs, or dietary supplements you use. Also tell them if you smoke, drink alcohol, or use illegal drugs. Some items may interact with your medicine. What should I watch for while using this medicine? Tell your doctor if your symptoms do not get better or if they get worse. Visit your doctor or health care professional for regular checks on your progress.  Because it may take several weeks to  see the full effects of this medicine, it is important to continue your treatment as prescribed by your doctor. Patients and their families should watch out for new or worsening thoughts of suicide or depression. Also watch out for sudden changes in feelings such as feeling anxious, agitated, panicky, irritable, hostile, aggressive, impulsive, severely restless, overly excited and hyperactive, or not being able to sleep. If this happens, especially at the beginning of treatment or after a change in dose, call your health care professional. Avoid alcoholic drinks while taking this medicine. Drinking large amounts of alcoholic beverages, using sleeping or anxiety medicines, or quickly stopping the use of these agents while taking this medicine may increase your risk for a seizure. Do not drive or use heavy machinery until you know how this medicine affects you. This medicine can impair your ability to perform these tasks. Do not take this medicine close to bedtime. It may prevent you from sleeping. Your mouth may get dry. Chewing sugarless gum or sucking hard candy, and drinking plenty of water may help. Contact your doctor if the problem does not go away or is severe. The tablet shell for some brands of this medicine does not dissolve. This is normal. The tablet shell may appear whole in the stool. This is not a cause for concern. What side effects may I notice from receiving this medicine? Side effects that you should report to your doctor or health care professional as soon as possible: -allergic reactions like skin rash, itching or hives, swelling of the face, lips, or tongue -breathing problems -changes in vision -confusion -elevated mood, decreased need for sleep, racing thoughts, impulsive behavior -fast or irregular heartbeat -hallucinations, loss of contact with reality -increased blood pressure -redness, blistering, peeling or loosening of the skin,  including inside the mouth -seizures -suicidal thoughts or other mood changes -unusually weak or tired -vomiting Side effects that usually do not require medical attention (report to your doctor or health care professional if they continue or are bothersome): -constipation -headache -loss of appetite -nausea -tremors -weight loss This list may not describe all possible side effects. Call your doctor for medical advice about side effects. You may report side effects to FDA at 1-800-FDA-1088. Where should I keep my medicine? Keep out of the reach of children. Store at room temperature between 15 and 30 degrees C (59 and 86 degrees F). Throw away any unused medicine after the expiration date. NOTE: This sheet is a summary. It may not cover all possible information. If you have questions about this medicine, talk to your doctor, pharmacist, or health care provider.  2018 Elsevier/Gold Standard (2015-12-19 13:55:13)     If you have lab work done today you will be contacted with your lab results within the next 2 weeks.  If you have not heard from [place] then please contact us. The fastest way to get your results is to register for My Chart.   IF you received an x-ray today, you will receive an invoice from [facility]. Please contact [facility] at [phone] with questions or concerns regarding your invoice.   IF you received labwork today, you will receive an invoice from [facility]. Please contact [facility] at [phone] with questions or concerns regarding your invoice.   Our billing staff will not be able to assist you with questions regarding bills from these companies.  You will be contacted with the lab results as soon as they are available. The fastest way to get your results is to activate your  My Chart account. Instructions are located on the last page of this paperwork. If you have not heard from us regarding the results in 2 weeks, please contact this office.

## 2018-03-24 NOTE — Progress Notes (Signed)
    MRN: [id number] DOB: 04/12/1971  Subjective:   Christy Haley is a 47 y.o. female presenting for refills of her Wellbutrin.  She uses this for her anxiety and depression.  Patient used to use clonazepam as well.  For this by her previous PCP, PA-Chelle.  Today, patient reports that she does very well with Wellbutrin.  She has not yet established care with another PCP at this practice.  Christy Haley has a current medication list which includes the following prescription(s): bupropion, calcium carbonate, collagen, ibuprofen, metronidazole, and multivitamin, and the following Facility-Administered Medications: levonorgestrel. Also is allergic to oxycodone.  Christy Haley  has a past medical history of Anxiety, Depression, and GERD (gastroesophageal reflux disease). Also  has a past surgical history that includes Pelvic laparoscopy; Intrauterine device insertion (2010); and Augmentation mammaplasty (10/09/2015).  Objective:   Vitals: BP 100/65   Pulse 61   Temp 98.1 F (36.7 C) (Oral)   Resp 16   Ht 5\' 2"  (1.575 m)   Wt 143 lb 3.2 oz (65 kg)   SpO2 97%   BMI 26.19 kg/m   Wt Readings from Last 3 Encounters:  03/24/18 143 lb 3.2 oz (65 kg)  10/13/17 138 lb (62.6 kg)  09/08/17 137 lb (62.1 kg)    Physical Exam  Constitutional: She is oriented to person, place, and time. She appears well-developed and well-nourished.  Cardiovascular: Normal rate.  Pulmonary/Chest: Effort normal.  Neurological: She is alert and oriented to person, place, and time.   Assessment and Plan :   Generalized anxiety disorder - Plan: buPROPion (WELLBUTRIN XL) 300 MG 24 hr tablet  Recurrent major depressive disorder, in partial remission (HCC) - Plan: buPROPion (WELLBUTRIN XL) 300 MG 24 hr tablet  Insomnia, unspecified type  Patient is very stable, refilled her Wellbutrin.  She is going to try and establish care with another PCP here at this practice.  Wallis BambergMario Kooper Godshall, PA-C Primary Care at [facility] [phone] 03/24/2018  4:16 PM

## 2018-03-27 DIAGNOSIS — S83241D Other tear of medial meniscus, current injury, right knee, subsequent encounter: Secondary | ICD-10-CM | POA: Diagnosis not present

## 2018-04-21 DIAGNOSIS — R531 Weakness: Secondary | ICD-10-CM | POA: Diagnosis not present

## 2018-04-21 DIAGNOSIS — M25661 Stiffness of right knee, not elsewhere classified: Secondary | ICD-10-CM | POA: Diagnosis not present

## 2018-04-21 DIAGNOSIS — Z4789 Encounter for other orthopedic aftercare: Secondary | ICD-10-CM | POA: Diagnosis not present

## 2018-04-24 DIAGNOSIS — M2241 Chondromalacia patellae, right knee: Secondary | ICD-10-CM | POA: Diagnosis not present

## 2018-05-02 DIAGNOSIS — M25561 Pain in right knee: Secondary | ICD-10-CM | POA: Diagnosis not present

## 2018-05-04 ENCOUNTER — Ambulatory Visit (INDEPENDENT_AMBULATORY_CARE_PROVIDER_SITE_OTHER): Payer: BLUE CROSS/BLUE SHIELD | Admitting: Obstetrics & Gynecology

## 2018-05-04 ENCOUNTER — Encounter: Payer: Self-pay | Admitting: Obstetrics & Gynecology

## 2018-05-04 VITALS — BP 122/76

## 2018-05-04 DIAGNOSIS — Z975 Presence of (intrauterine) contraceptive device: Secondary | ICD-10-CM

## 2018-05-04 DIAGNOSIS — N72 Inflammatory disease of cervix uteri: Secondary | ICD-10-CM

## 2018-05-04 DIAGNOSIS — B977 Papillomavirus as the cause of diseases classified elsewhere: Secondary | ICD-10-CM

## 2018-05-04 NOTE — Addendum Note (Signed)
Addended by: Berna Spare A on: 05/04/2018 09:01 AM   Modules accepted: Orders

## 2018-05-04 NOTE — Progress Notes (Signed)
    Christy Haley 1970/10/20 [id number]        47 y.o.  [id number]   RP: 6 month repeat Pap test  HPI: HPV HR pos, Pap negative.  HPV 16-18-45 negative.  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  Well on Mirena IUD.   OB History  Gravida Para Term Preterm AB Living  3 2     1 2   SAB TAB Ectopic Multiple Live Births  1            # Outcome Date GA Lbr Len/2nd Weight Sex Delivery Anes PTL Lv  3 SAB           2 Para     F Vag-Spont     1 Para     F Vag-Spont       Past medical history,surgical history, problem list, medications, allergies, family history and social history were all reviewed and documented in the EPIC chart.   Directed ROS with pertinent positives and negatives documented in the history of present illness/assessment and plan.  Exam:  Vitals:   05/04/18 0814  BP: 122/76   General appearance:  Normal  Abdomen: Normal  Gynecologic exam:  Vulva normal.  Speculum:  Cervix normal.  IUD strings visible. Vagina normal.  Pap/HPV HR done.     Assessment/Plan:  47 y.o. G4W1027   1. 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  HPV 16-18-45 negative.  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  Repeat Pap test/HPV HR today.  Management per results.  Annual/gyn visit in 4-5 months.  2. IUD contraception Well tolerated and in good position.  Genia Del MD, 8:24 AM 05/04/2018

## 2018-05-04 NOTE — Patient Instructions (Signed)
1. 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  HPV 16-18-45 negative.  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  Repeat Pap test/HPV HR today.  Management per results.  Annual/gyn visit in 4-5 months.  2. IUD contraception Well tolerated and in good position.  Ernst Breach un placer verle hoy!  Voy a informarle de sus [employer].

## 2018-05-08 LAB — PAP, TP IMAGING W/ HPV RNA, RFLX HPV TYPE 16,18/45: HPV DNA High Risk: DETECTED — AB

## 2018-05-08 LAB — HPV TYPE 16 AND 18/45 RNA
HPV Type 16 RNA: NOT DETECTED
HPV Type 18/45 RNA: NOT DETECTED

## 2018-05-22 DIAGNOSIS — M1711 Unilateral primary osteoarthritis, right knee: Secondary | ICD-10-CM | POA: Diagnosis not present

## 2018-05-24 DIAGNOSIS — M1711 Unilateral primary osteoarthritis, right knee: Secondary | ICD-10-CM | POA: Diagnosis not present

## 2018-05-31 ENCOUNTER — Telehealth: Payer: Self-pay

## 2018-05-31 DIAGNOSIS — M1711 Unilateral primary osteoarthritis, right knee: Secondary | ICD-10-CM | POA: Diagnosis not present

## 2018-05-31 NOTE — Telephone Encounter (Signed)
Called and spoke with patient and relayed this message to her.

## 2018-05-31 NOTE — Telephone Encounter (Signed)
I called patient because My Chart email unready. I reviewed the message below with her and answered her questions.  "Dr. Seymour BarsLavoie wanted you to know that your Pap smear was normal. However the high risk HPV test was positive. They tested for HPV 16-18-45, high risk strains most often associated with cervical cancer, and those were negative. Dr. Seymour BarsLavoie recommend you repeat the Pap test in 6 months with your Annual/gyn visit. I added this to your appointment already scheduled for March 5th. "

## 2018-06-07 DIAGNOSIS — M1711 Unilateral primary osteoarthritis, right knee: Secondary | ICD-10-CM | POA: Diagnosis not present

## 2018-07-18 ENCOUNTER — Other Ambulatory Visit: Payer: Self-pay | Admitting: Obstetrics & Gynecology

## 2018-07-18 DIAGNOSIS — Z1231 Encounter for screening mammogram for malignant neoplasm of breast: Secondary | ICD-10-CM

## 2018-07-27 DIAGNOSIS — M542 Cervicalgia: Secondary | ICD-10-CM | POA: Diagnosis not present

## 2018-07-27 DIAGNOSIS — M25511 Pain in right shoulder: Secondary | ICD-10-CM | POA: Diagnosis not present

## 2018-07-27 DIAGNOSIS — M25512 Pain in left shoulder: Secondary | ICD-10-CM | POA: Diagnosis not present

## 2018-08-10 DIAGNOSIS — M25511 Pain in right shoulder: Secondary | ICD-10-CM | POA: Diagnosis not present

## 2018-08-10 DIAGNOSIS — M25512 Pain in left shoulder: Secondary | ICD-10-CM | POA: Diagnosis not present

## 2018-08-10 DIAGNOSIS — M542 Cervicalgia: Secondary | ICD-10-CM | POA: Diagnosis not present

## 2018-08-15 ENCOUNTER — Ambulatory Visit
Admission: RE | Admit: 2018-08-15 | Discharge: 2018-08-15 | Disposition: A | Discharge status: Home / Self Care | Payer: BLUE CROSS/BLUE SHIELD | Source: Ambulatory Visit | Attending: Obstetrics & Gynecology | Admitting: Obstetrics & Gynecology

## 2018-08-15 DIAGNOSIS — Z1231 Encounter for screening mammogram for malignant neoplasm of breast: Secondary | ICD-10-CM | POA: Diagnosis not present

## 2018-08-31 DIAGNOSIS — M25512 Pain in left shoulder: Secondary | ICD-10-CM | POA: Diagnosis not present

## 2018-08-31 DIAGNOSIS — M542 Cervicalgia: Secondary | ICD-10-CM | POA: Diagnosis not present

## 2018-08-31 DIAGNOSIS — M25511 Pain in right shoulder: Secondary | ICD-10-CM | POA: Diagnosis not present

## 2018-09-14 ENCOUNTER — Encounter: Payer: Self-pay | Admitting: Obstetrics & Gynecology

## 2018-09-14 ENCOUNTER — Ambulatory Visit: Payer: BLUE CROSS/BLUE SHIELD | Admitting: Obstetrics & Gynecology

## 2018-09-14 VITALS — BP 118/76 | Ht 61.5 in | Wt 136.0 lb

## 2018-09-14 DIAGNOSIS — Z1151 Encounter for screening for human papillomavirus (HPV): Secondary | ICD-10-CM | POA: Diagnosis not present

## 2018-09-14 DIAGNOSIS — Z30431 Encounter for routine checking of intrauterine contraceptive device: Secondary | ICD-10-CM | POA: Diagnosis not present

## 2018-09-14 DIAGNOSIS — Z01419 Encounter for gynecological examination (general) (routine) without abnormal findings: Secondary | ICD-10-CM

## 2018-09-14 DIAGNOSIS — Z23 Encounter for immunization: Secondary | ICD-10-CM

## 2018-09-14 NOTE — Addendum Note (Signed)
Addended by: Berna Spare A on: 09/14/2018 09:17 AM   Modules accepted: Orders

## 2018-09-14 NOTE — Progress Notes (Signed)
Christy Haley 01/20/1971 [id number]   History:    48 y.o. G3P2A1L2 Divorced x 5 yrs.  Daughters 46 and 66 yo.  RP:  Established patient presenting for annual gyn exam   HPI: Well on Mirena IUD x 08/2014.  No BTB.  No pelvic pain.  Abstinent.  Last Pap negative/HPV HR pos.  HPV 16-18-45 negative.  Colpo 10/2017 No dysplasia.  Urine/BMs Normal.  Breasts normal, bilateral augmentation.  BMI 25.28.  Good fitness and Healthy nutrition.  May come back here for fasting health labs, will check insurance.  Past medical history,surgical history, family history and social history were all reviewed and documented in the EPIC chart.  Gynecologic History No LMP recorded. (Menstrual status: IUD). Contraception: Mirena IUD x 08/2014 Last Pap: 08/2017. Results were: Negative/HPV HR pos.  Colpo 10/2017 No dysplasia. Last mammogram: 08/2018. Results were: Negative. Bone Density: Never Colonoscopy: Never  Obstetric History OB History  Gravida Para Term Preterm AB Living  3 2     1 2   SAB TAB Ectopic Multiple Live Births  1            # Outcome Date GA Lbr Len/2nd Weight Sex Delivery Anes PTL Lv  3 SAB           2 Para     F Vag-Spont     1 Para     F Vag-Spont        ROS: A ROS was performed and pertinent positives and negatives are included in the history.  GENERAL: No fevers or chills. HEENT: No change in vision, no earache, sore throat or sinus congestion. NECK: No pain or stiffness. CARDIOVASCULAR: No chest pain or pressure. No palpitations. PULMONARY: No shortness of breath, cough or wheeze. GASTROINTESTINAL: No abdominal pain, nausea, vomiting or diarrhea, melena or bright red blood per rectum. GENITOURINARY: No urinary frequency, urgency, hesitancy or dysuria. MUSCULOSKELETAL: No joint or muscle pain, no back pain, no recent trauma. DERMATOLOGIC: No rash, no itching, no lesions. ENDOCRINE: No polyuria, polydipsia, no heat or cold intolerance. No recent change in weight. HEMATOLOGICAL:  No anemia or easy bruising or bleeding. NEUROLOGIC: No headache, seizures, numbness, tingling or weakness. PSYCHIATRIC: No depression, no loss of interest in normal activity or change in sleep pattern.     Exam:   BP 118/76   Ht 5' 1.5" (1.562 m)   Wt 136 lb (61.7 kg)   BMI 25.28 kg/m   Body mass index is 25.28 kg/m.  General appearance : Well developed well nourished female. No acute distress HEENT: Eyes: no retinal hemorrhage or exudates,  Neck supple, trachea midline, no carotid bruits, no thyroidmegaly Lungs: Clear to auscultation, no rhonchi or wheezes, or rib retractions  Heart: Regular rate and rhythm, no murmurs or gallops Breast:Examined in sitting and supine position were symmetrical in appearance, no palpable masses or tenderness,  no skin retraction, no nipple inversion, no nipple discharge, no skin discoloration, no axillary or supraclavicular lymphadenopathy Abdomen: no palpable masses or tenderness, no rebound or guarding Extremities: no edema or skin discoloration or tenderness  Pelvic: Vulva: Normal             Vagina: No gross lesions or discharge  Cervix: No gross lesions or discharge.  IUD strings visible.  Pap/HPV HR done.  Uterus  AV, normal size, shape and consistency, non-tender and mobile  Adnexa  Without masses or tenderness  Anus: Normal   Assessment/Plan:  48 y.o. female for annual exam   1. Encounter  for routine gynecological examination with Papanicolaou smear of cervix Normal gynecologic exam.  Pap with high-risk HPV done today.  Breast exam normal.  Screening mammogram February 2020 was negative.  Patient will verify with insurance and decide if desires fasting health labs here or not.  Good body mass index at 25.28.  Continue with fitness and healthy nutrition.  2. Encounter for routine checking of intrauterine contraceptive device (IUD) Mirena IUD x 08/2014.  Well on Mirena IUD and in good position.  3. Flu vaccine need Flu vaccine  given.  Other orders - Flu Vaccine QUAD 36+ mos IM (Fluarix, Quad PF)  Genia Del MD, 8:20 AM 09/14/2018

## 2018-09-14 NOTE — Patient Instructions (Signed)
1. Encounter for routine gynecological examination with Papanicolaou smear of cervix Normal gynecologic exam.  Pap with high-risk HPV done today.  Breast exam normal.  Screening mammogram February 2020 was negative.  Patient will verify with insurance and decide if desires fasting health labs here or not.  Good body mass index at 25.28.  Continue with fitness and healthy nutrition.  2. Encounter for routine checking of intrauterine contraceptive device (IUD) Mirena IUD x 08/2014.  Well on Mirena IUD and in good position.  3. Flu vaccine need Flu vaccine given.  Other orders - Flu Vaccine QUAD 36+ mos IM (Fluarix, Quad PF)  Karena Addison, fue un placer verle hoy!  Voy a informarle de sus [employer].

## 2018-09-15 LAB — PAP, TP IMAGING W/ HPV RNA, RFLX HPV TYPE 16,18/45: HPV DNA High Risk: NOT DETECTED

## 2019-03-08 ENCOUNTER — Ambulatory Visit: Payer: BLUE CROSS/BLUE SHIELD | Admitting: Family Medicine

## 2019-03-09 ENCOUNTER — Encounter: Payer: Self-pay | Admitting: Family Medicine

## 2019-04-20 ENCOUNTER — Encounter: Payer: Self-pay | Admitting: Family Medicine

## 2019-04-20 ENCOUNTER — Telehealth (INDEPENDENT_AMBULATORY_CARE_PROVIDER_SITE_OTHER): Payer: BLUE CROSS/BLUE SHIELD | Admitting: Family Medicine

## 2019-04-20 DIAGNOSIS — F3341 Major depressive disorder, recurrent, in partial remission: Secondary | ICD-10-CM | POA: Diagnosis not present

## 2019-04-20 DIAGNOSIS — F411 Generalized anxiety disorder: Secondary | ICD-10-CM | POA: Diagnosis not present

## 2019-04-20 MED ORDER — HYDROXYZINE HCL 25 MG PO TABS: 25.0000 mg | ORAL_TABLET | Freq: Every evening | ORAL | 5 refills | Status: DC | PRN

## 2019-04-20 MED ORDER — ESCITALOPRAM OXALATE 20 MG PO TABS: 20.0000 mg | ORAL_TABLET | Freq: Every day | ORAL | 2 refills | Status: DC

## 2019-04-20 MED ORDER — BUPROPION HCL ER (XL) 300 MG PO TB24: 300.0000 mg | ORAL_TABLET | Freq: Every day | ORAL | 3 refills | Status: DC

## 2019-04-20 NOTE — Progress Notes (Signed)
Virtual Visit Note  I connected with patient on 04/20/19 at 533pm by phone (unable to get audio via [email or web address] video) and verified that I am speaking with the correct person using two identifiers. Christy Haley is currently located at home and patient is currently with them during visit. The provider, Rutherford Guys, MD is located in their office at time of visit.  I discussed the limitations, risks, security and privacy concerns of performing an evaluation and management service by telephone and the availability of in person appointments. I also discussed with the patient that there may be a patient responsible charge related to this service. The patient expressed understanding and agreed to proceed.   CC: anxiety  HPI ? Has been struggling with anxiety and insomnia Has been grinding her teeth more Has been on wellbutrin XL 300mg  for several years - depression overall doing well Vistaril has been helping her with sleep She has never been on SSRI  Allergies  Allergen Reactions  . Oxycodone Rash    Prior to Admission medications   Medication Sig Start Date End Date Taking? Authorizing Provider  buPROPion (WELLBUTRIN XL) 150 MG 24 hr tablet Take 1 tablet (150 mg total) by mouth daily. 03/24/18   Jaynee Eagles, PA-C  buPROPion (WELLBUTRIN XL) 300 MG 24 hr tablet Take 1 tablet (300 mg total) by mouth daily. 03/24/18   Jaynee Eagles, PA-C  calcium carbonate (OS-CAL) 600 MG TABS tablet Take 600 mg by mouth 2 (two) times daily with a meal.    [provider]  COLLAGEN PO Take by mouth.    [provider]  diclofenac (VOLTAREN) 25 MG EC tablet Take 25 mg by mouth 2 (two) times daily.    [provider]  hydrOXYzine (ATARAX/VISTARIL) 25 MG tablet Take 1 tablet (25 mg total) by mouth at bedtime as needed for anxiety. 03/24/18   Jaynee Eagles, PA-C  ibuprofen (ADVIL,MOTRIN) 600 MG tablet Take 1 tablet (600 mg total) by mouth every 8 (eight) hours as needed. 08/25/17    Forrest Moron, MD  Multiple Vitamin (MULTIVITAMIN) tablet Take 1 tablet by mouth daily.      [provider]    Past Medical History:  Diagnosis Date  . Anxiety   . Depression   . GERD (gastroesophageal reflux disease)     Past Surgical History:  Procedure Laterality Date  . AUGMENTATION MAMMAPLASTY  10/09/2015   breast lift and implants   . INTRAUTERINE DEVICE INSERTION  2010   Mirena  . KNEE SURGERY Right   . PELVIC LAPAROSCOPY     ovarian cystectomy    Social History   Tobacco Use  . Smoking status: Never Smoker  . Smokeless tobacco: Never Used  Substance Use Topics  . Alcohol use: No    Alcohol/week: 0.0 standard drinks    Family History  Problem Relation Age of Onset  . Arthritis Mother   . Hypertension Mother   . Diabetes Maternal Grandmother   . Heart disease Neg Hx   . Hyperlipidemia Neg Hx   . Kidney disease Neg Hx   . Stroke Neg Hx   . Alcohol abuse Neg Hx   . Cancer Neg Hx   . COPD Neg Hx   . Early death Neg Hx   . Breast cancer Neg Hx     ROS Per hpi  Objective  Vitals as reported by the patient: none   ASSESSMENT and PLAN  1. Generalized anxiety disorder Uncontrolled. Starting lexapro. Reviewed r/se/b.  Vistaril prn insomnia. .  2. Recurrent major depressive disorder, in partial remission (HCC) Controlled. Continue current regime.  - buPROPion (WELLBUTRIN XL) 300 MG 24 hr tablet; Take 1 tablet (300 mg total) by mouth daily.  Other orders - escitalopram (LEXAPRO) 20 MG tablet; Take 1 tablet (20 mg total) by mouth at bedtime. - hydrOXYzine (ATARAX/VISTARIL) 25 MG tablet; Take 1 tablet (25 mg total) by mouth at bedtime as needed for anxiety.  FOLLOW-UP: 4 weeks   The above assessment and management plan was discussed with the patient. The patient verbalized understanding of and has agreed to the management plan. Patient is aware to call the clinic if symptoms persist or worsen. Patient is aware when to return to the clinic  for a follow-up visit. Patient educated on when it is appropriate to go to the emergency department.    I provided 14 minutes of non-face-to-face time during this encounter.  Myles Lipps, MD Primary Care at [facility] [address] [place], [place] [address] Ph.  [phone] Fax [phone]

## 2019-04-20 NOTE — Progress Notes (Signed)
Pt is having trouble sleeping, in need of refills on anxiety medication Wellbutrin and hydroxyzine, Pharmacy verified. Completed gad 7 for score 19.

## 2019-05-06 DIAGNOSIS — Z20828 Contact with and (suspected) exposure to other viral communicable diseases: Secondary | ICD-10-CM | POA: Diagnosis not present

## 2019-06-16 ENCOUNTER — Other Ambulatory Visit: Payer: Self-pay

## 2019-06-16 ENCOUNTER — Encounter (HOSPITAL_COMMUNITY): Payer: Self-pay | Admitting: Emergency Medicine

## 2019-06-16 ENCOUNTER — Emergency Department (HOSPITAL_COMMUNITY): Payer: BLUE CROSS/BLUE SHIELD

## 2019-06-16 ENCOUNTER — Emergency Department (HOSPITAL_COMMUNITY)
Admission: EM | Admit: 2019-06-16 | Discharge: 2019-06-16 | Disposition: A | Discharge status: Home / Self Care | Payer: BLUE CROSS/BLUE SHIELD | Attending: Emergency Medicine | Admitting: Emergency Medicine

## 2019-06-16 DIAGNOSIS — Y939 Activity, unspecified: Secondary | ICD-10-CM | POA: Insufficient documentation

## 2019-06-16 DIAGNOSIS — S52592A Other fractures of lower end of left radius, initial encounter for closed fracture: Secondary | ICD-10-CM | POA: Diagnosis not present

## 2019-06-16 DIAGNOSIS — Y92000 Kitchen of unspecified non-institutional (private) residence as  the place of occurrence of the external cause: Secondary | ICD-10-CM | POA: Insufficient documentation

## 2019-06-16 DIAGNOSIS — S52502A Unspecified fracture of the lower end of left radius, initial encounter for closed fracture: Secondary | ICD-10-CM | POA: Insufficient documentation

## 2019-06-16 DIAGNOSIS — S59911A Unspecified injury of right forearm, initial encounter: Secondary | ICD-10-CM | POA: Diagnosis not present

## 2019-06-16 DIAGNOSIS — Y999 Unspecified external cause status: Secondary | ICD-10-CM | POA: Insufficient documentation

## 2019-06-16 DIAGNOSIS — Z79899 Other long term (current) drug therapy: Secondary | ICD-10-CM | POA: Insufficient documentation

## 2019-06-16 DIAGNOSIS — W010XXA Fall on same level from slipping, tripping and stumbling without subsequent striking against object, initial encounter: Secondary | ICD-10-CM | POA: Insufficient documentation

## 2019-06-16 MED ORDER — IBUPROFEN 600 MG PO TABS: 600.0000 mg | ORAL_TABLET | Freq: Four times a day (QID) | ORAL | 0 refills | Status: DC | PRN

## 2019-06-16 MED ORDER — HYDROCODONE-ACETAMINOPHEN 5-325 MG PO TABS: 1.0000 | ORAL_TABLET | Freq: Four times a day (QID) | ORAL | 0 refills | Status: DC | PRN

## 2019-06-16 MED ORDER — HYDROMORPHONE HCL 1 MG/ML IJ SOLN
0.5000 mg | Freq: Once | INTRAMUSCULAR | Status: AC
  Administered 2019-06-16: 0.5 mg via INTRAMUSCULAR
  Filled 2019-06-16: qty 1

## 2019-06-16 NOTE — Discharge Instructions (Addendum)
1. Medications: Alternate 600 mg of ibuprofen and [phone] mg of Tylenol every 3 hours as needed for pain. Do not exceed 4000 mg of Tylenol daily.  Take ibuprofen with food to avoid upset stomach issues.  Take hydrocodone-acetaminophen tablets as needed for severe pain but do not drive, drink alcohol, or operate heavy machinery while taking this medication as it can make you drowsy.  Please note this medication also contains Tylenol. 2. Treatment: Keep the splint clean and dry.  Wear it at all times.  Cover with a bag and tape a it shut when showering.  Pply ice and elevate to help with swelling and pain. 3. Follow Up: Please followup with orthopedics as directed within 1 week if no improvement for discussion of your diagnoses and further evaluation after today's visit; if you do not have a primary care doctor use the resource guide provided to find one; Please return to the ER for worsening symptoms or other concerns such as worsening swelling, redness of the skin, fevers, loss of pulses, or loss of feeling

## 2019-06-16 NOTE — ED Triage Notes (Addendum)
Pt slipped and fell approx 30 min ago.  C/o L forearm pain.  CMS intact.

## 2019-06-16 NOTE — Progress Notes (Signed)
Orthopedic Tech Progress Note Patient Details:  Christy Haley 1971/07/02 [id number]  Ortho Devices Type of Ortho Device: Sugartong splint, Arm sling Ortho Device/Splint Location: LUE Ortho Device/Splint Interventions: Application, Ordered   Post Interventions Patient Tolerated: Well Instructions Provided: Care of device, Adjustment of device   Janit Pagan 06/16/2019, 2:02 PM

## 2019-06-16 NOTE — ED Notes (Addendum)
megan from xray called stating that pt has fracture of wrist on her forerm xray, requesting that dedicated wrist xrays be ordered. Will make PA [facility] made aware and advised she would place appropriate orders

## 2019-06-16 NOTE — ED Provider Notes (Signed)
[facility] EMERGENCY DEPARTMENT Provider Note   CSN: [id number] Arrival date & time: 06/16/19  1128     History   Chief Complaint Chief Complaint  Patient presents with  . Arm Pain  . Fall    HPI Christy Haley is a 48 y.o. female presents for evaluation of acute onset, constant left forearm and wrist pain secondary to mechanical fall just prior to arrival.  She reports that she was in the kitchen and slipped on some water, landing on her buttocks with her left arm extended.  Denies head injury or loss of consciousness.  Since then she has had immediate onset of severe left wrist pain extending into the fingers and up the forearm.  She has some mild muscle soreness to the posterior left upper arm extending into the shoulder and left side of the neck but states this is quite mild especially compared to her wrist pain.  Denies numbness or tingling but has difficulty making a fist due to pain in the wrist.  No headaches, nausea, vomiting, chest pain, shortness of breath.  No low back pain.  Is been able to ambulate since the fall without difficulty.  She is right-hand dominant.  She has been seen by [facility] previously for a knee issue.     The history is provided by the patient.    Past Medical History:  Diagnosis Date  . Anxiety   . Depression   . GERD (gastroesophageal reflux disease)     Patient Active Problem List   Diagnosis Date Noted  . Recurrent major depressive disorder, in partial remission (HCC) 01/12/2017  . Generalized anxiety disorder 02/20/2015  . IUD (intrauterine device) in place 08/12/2014  . Cervical lesion 06/21/2014  . Hyperglycemia 04/24/2013  . Muscle tiredness 04/12/2013  . Alopecia 04/12/2013  . Loss of weight 04/12/2013  . Hot flushes, perimenopausal 04/12/2013  . Iron deficiency anemia 12/07/2012  . Left lumbar radiculitis 12/07/2012  . Leukopenia 01/14/2012  . GERD 01/06/2012    Past Surgical  History:  Procedure Laterality Date  . AUGMENTATION MAMMAPLASTY  10/09/2015   breast lift and implants   . INTRAUTERINE DEVICE INSERTION  2010   Mirena  . KNEE SURGERY Right   . PELVIC LAPAROSCOPY     ovarian cystectomy     OB History    Gravida  3   Para  2   Term      Preterm      AB  1   Living  2     SAB  1   TAB      Ectopic      Multiple      Live Births               Home Medications    Prior to Admission medications   Medication Sig Start Date End Date Taking? Authorizing Provider  buPROPion (WELLBUTRIN XL) 300 MG 24 hr tablet Take 1 tablet (300 mg total) by mouth daily. 04/20/19   Myles Lipps, MD  calcium carbonate (OS-CAL) 600 MG TABS tablet Take 600 mg by mouth 2 (two) times daily with a meal.    [provider]  COLLAGEN PO Take by mouth.    [provider]  diclofenac (VOLTAREN) 25 MG EC tablet Take 25 mg by mouth 2 (two) times daily.    [provider]  escitalopram (LEXAPRO) 20 MG tablet Take 1 tablet (20 mg total) by mouth at bedtime. 04/20/19  Rutherford Guys, MD  HYDROcodone-acetaminophen (NORCO/VICODIN) 5-325 MG tablet Take 1 tablet by mouth every 6 (six) hours as needed for severe pain. 06/16/19   Kamoni Gentles A, PA-C  hydrOXYzine (ATARAX/VISTARIL) 25 MG tablet Take 1 tablet (25 mg total) by mouth at bedtime as needed for anxiety. 04/20/19   Rutherford Guys, MD  ibuprofen (ADVIL) 600 MG tablet Take 1 tablet (600 mg total) by mouth every 6 (six) hours as needed. 06/16/19   Lucrezia Dehne A, PA-C  Multiple Vitamin (MULTIVITAMIN) tablet Take 1 tablet by mouth daily.      [provider]    Family History Family History  Problem Relation Age of Onset  . Arthritis Mother   . Hypertension Mother   . Diabetes Maternal Grandmother   . Heart disease Neg Hx   . Hyperlipidemia Neg Hx   . Kidney disease Neg Hx   . Stroke Neg Hx   . Alcohol abuse Neg Hx   . Cancer Neg Hx   . COPD Neg Hx   . Early death Neg  Hx   . Breast cancer Neg Hx     Social History Social History   Tobacco Use  . Smoking status: Never Smoker  . Smokeless tobacco: Never Used  Substance Use Topics  . Alcohol use: No    Alcohol/week: 0.0 standard drinks  . Drug use: No     Allergies   Oxycodone   Review of Systems Review of Systems  Constitutional: Negative for chills and fever.  Respiratory: Negative for shortness of breath.   Cardiovascular: Negative for chest pain.  Gastrointestinal: Negative for nausea and vomiting.  Musculoskeletal: Positive for arthralgias.  Neurological: Negative for syncope, numbness and headaches.  All other systems reviewed and are negative.    Physical Exam Updated Vital Signs BP 135/82 (BP Location: Right Arm)   Pulse 60   Temp 98.1 F (36.7 C) (Oral)   Resp 17   SpO2 99%   Physical Exam Vitals signs and nursing note reviewed.  Constitutional:      General: She is not in acute distress.    Appearance: She is well-developed.  HENT:     Head: Normocephalic and atraumatic.  Eyes:     General:        Right eye: No discharge.        Left eye: No discharge.     Conjunctiva/sclera: Conjunctivae normal.  Neck:     Vascular: No JVD.     Trachea: No tracheal deviation.  Cardiovascular:     Rate and Rhythm: Normal rate.     Pulses: Normal pulses.     Comments: 2+ radial pulses bilaterally.  Compartments are soft. Pulmonary:     Effort: Pulmonary effort is normal.  Abdominal:     General: There is no distension.  Musculoskeletal:        General: Swelling, tenderness and deformity present.     Comments: Maximally tender to palpation overlying the left distal radius with associated swelling and deformity.  Limited range of motion of the left wrist due to swelling and pain but good grip strength bilaterally.  Patient is able to flex and extend bilateral upper extremity major muscle groups against resistance without difficulty.  No ecchymosis noted.  No tenderness to  palpation of the left elbow, normal passive range of motion of the left elbow and left shoulder.  No midline spine tenderness, no deformity, crepitus, or step-off noted.  Skin:    General: Skin is warm and  dry.     Capillary Refill: Capillary refill takes less than 2 seconds.     Findings: No erythema.  Neurological:     Mental Status: She is alert.     Comments: Sensation intact to light touch of bilateral upper extremities.  Psychiatric:        Behavior: Behavior normal.      ED Treatments / Results  Labs (all labs ordered are listed, but only abnormal results are displayed) Labs Reviewed - No data to display  EKG None  Radiology Dg Forearm Left  Result Date: 06/16/2019 CLINICAL DATA:  Fall. Left forearm pain. EXAM: LEFT FOREARM - 2 VIEW COMPARISON:  None FINDINGS: Acute, comminuted, slightly impacted fracture of the distal radius is identified. No additional fractures identified. No dislocation. IMPRESSION: Acute, comminuted, slightly impacted fracture of the distal radius. Electronically Signed   By: Signa Kellaylor  Stroud M.D.   On: 06/16/2019 12:17   Dg Wrist Complete Left  Result Date: 06/16/2019 CLINICAL DATA:  Fall. EXAM: LEFT WRIST - COMPLETE 3+ VIEW COMPARISON:  None FINDINGS: Acute, comminuted, slightly impacted fracture deformity of the distal radius is identified. Mild dorsal displacement of fracture fragments identified. No dislocation identified. IMPRESSION: Comminuted, slightly impacted fracture deformity of the distal radius. Electronically Signed   By: Signa Kellaylor  Stroud M.D.   On: 06/16/2019 12:18    Procedures .Splint Application  Date/Time: 06/16/2019 2:40 PM Performed by: Jeanie SewerFawze, Naama Sappington A, PA-C Authorized by: Jeanie SewerFawze, Terree Gaultney A, PA-C   Consent:    Consent obtained:  Verbal   Consent given by:  Patient   Risks discussed:  Discoloration, numbness, pain and swelling   Alternatives discussed:  No treatment Pre-procedure details:    Sensation:  Normal Procedure details:     Laterality:  Left   Location:  Wrist   Wrist:  L wrist   Splint type:  Sugar tong   Supplies:  Ortho-Glass, sling, cotton padding and elastic bandage Post-procedure details:    Pain:  Improved   Sensation:  Normal   Skin color:  Pink   Patient tolerance of procedure:  Tolerated well, no immediate complications   (including critical care time)  Medications Ordered in ED Medications  HYDROmorphone (DILAUDID) injection 0.5 mg (0.5 mg Intramuscular Given 06/16/19 1327)     Initial Impression / Assessment and Plan / ED Course  I have reviewed the triage vital signs and the nursing notes.  Pertinent labs & imaging results that were available during my care of the patient were reviewed by me and considered in my medical decision making (see chart for details).        Patient presenting for evaluation of left forearm and wrist pain secondary to mechanical fall.  Patient afebrile, vital signs are stable.  She is nontoxic in appearance.  She is neurovascularly intact.  Compartments are soft.  Radiographs show comminuted slightly impacted fracture deformity of the distal radius.  No abnormality of the left elbow noted.  No midline spine tenderness.  No history of head injury or loss of consciousness.  She is ambulatory without difficulty.  Will place in a sugar tong splint.  Pain was managed in the ED.  Neurovascular status was checked before and after splint application.  She tolerated this without difficulty.  She remains neurovascularly intact on reassessment.  Will discharge with small amount of hydrocodone for severe breakthrough pain which she has tolerated before without difficulty.  [facility] Dr[facility] Amanda PeaGramig who is on-call today for hand  surgery.  Discussed strict ED return precautions.  Patient and daughter verbalized understanding of and agreement with plan and patient stable for discharge home at this time.  Discussed with Dr. Charm Barges who agrees  with assessment and plan at this time.  Final Clinical Impressions(s) / ED Diagnoses   Final diagnoses:  Closed fracture of distal end of left radius, unspecified fracture morphology, initial encounter    ED Discharge Orders         Ordered    HYDROcodone-acetaminophen (NORCO/VICODIN) 5-325 MG tablet  Every 6 hours PRN     06/16/19 1342    ibuprofen (ADVIL) 600 MG tablet  Every 6 hours PRN     06/16/19 1342           Jeanie Sewer, PA-C 06/16/19 1454    Terrilee Files, MD 06/16/19 Flossie Buffy

## 2019-06-18 DIAGNOSIS — M25532 Pain in left wrist: Secondary | ICD-10-CM | POA: Diagnosis not present

## 2019-06-21 DIAGNOSIS — X58XXXA Exposure to other specified factors, initial encounter: Secondary | ICD-10-CM | POA: Diagnosis not present

## 2019-06-21 DIAGNOSIS — S52572A Other intraarticular fracture of lower end of left radius, initial encounter for closed fracture: Secondary | ICD-10-CM | POA: Diagnosis not present

## 2019-06-21 DIAGNOSIS — Y999 Unspecified external cause status: Secondary | ICD-10-CM | POA: Diagnosis not present

## 2019-06-21 DIAGNOSIS — S52502A Unspecified fracture of the lower end of left radius, initial encounter for closed fracture: Secondary | ICD-10-CM | POA: Diagnosis not present

## 2019-06-21 HISTORY — PX: WRIST SURGERY: SHX841

## 2019-07-02 ENCOUNTER — Other Ambulatory Visit: Payer: Self-pay | Admitting: Obstetrics & Gynecology

## 2019-07-02 DIAGNOSIS — Z1231 Encounter for screening mammogram for malignant neoplasm of breast: Secondary | ICD-10-CM

## 2019-07-04 DIAGNOSIS — S52502D Unspecified fracture of the lower end of left radius, subsequent encounter for closed fracture with routine healing: Secondary | ICD-10-CM | POA: Diagnosis not present

## 2019-08-01 DIAGNOSIS — S52502D Unspecified fracture of the lower end of left radius, subsequent encounter for closed fracture with routine healing: Secondary | ICD-10-CM | POA: Diagnosis not present

## 2019-08-03 DIAGNOSIS — M25532 Pain in left wrist: Secondary | ICD-10-CM | POA: Diagnosis not present

## 2019-08-06 DIAGNOSIS — M25532 Pain in left wrist: Secondary | ICD-10-CM | POA: Diagnosis not present

## 2019-08-08 DIAGNOSIS — M25532 Pain in left wrist: Secondary | ICD-10-CM | POA: Diagnosis not present

## 2019-08-15 DIAGNOSIS — M25532 Pain in left wrist: Secondary | ICD-10-CM | POA: Diagnosis not present

## 2019-08-17 ENCOUNTER — Ambulatory Visit
Admission: RE | Admit: 2019-08-17 | Discharge: 2019-08-17 | Disposition: A | Discharge status: Home / Self Care | Payer: BLUE CROSS/BLUE SHIELD | Source: Ambulatory Visit | Attending: Obstetrics & Gynecology | Admitting: Obstetrics & Gynecology

## 2019-08-17 ENCOUNTER — Other Ambulatory Visit: Payer: Self-pay

## 2019-08-17 ENCOUNTER — Ambulatory Visit: Payer: Self-pay

## 2019-08-17 DIAGNOSIS — Z1231 Encounter for screening mammogram for malignant neoplasm of breast: Secondary | ICD-10-CM

## 2019-08-20 DIAGNOSIS — M25532 Pain in left wrist: Secondary | ICD-10-CM | POA: Diagnosis not present

## 2019-08-24 DIAGNOSIS — M25532 Pain in left wrist: Secondary | ICD-10-CM | POA: Diagnosis not present

## 2019-08-27 DIAGNOSIS — M25532 Pain in left wrist: Secondary | ICD-10-CM | POA: Diagnosis not present

## 2019-08-29 DIAGNOSIS — S52502D Unspecified fracture of the lower end of left radius, subsequent encounter for closed fracture with routine healing: Secondary | ICD-10-CM | POA: Diagnosis not present

## 2019-08-29 DIAGNOSIS — M25512 Pain in left shoulder: Secondary | ICD-10-CM | POA: Diagnosis not present

## 2019-09-05 DIAGNOSIS — M25532 Pain in left wrist: Secondary | ICD-10-CM | POA: Diagnosis not present

## 2019-09-07 DIAGNOSIS — M25532 Pain in left wrist: Secondary | ICD-10-CM | POA: Diagnosis not present

## 2019-09-12 DIAGNOSIS — M25532 Pain in left wrist: Secondary | ICD-10-CM | POA: Diagnosis not present

## 2019-09-17 ENCOUNTER — Encounter: Payer: BLUE CROSS/BLUE SHIELD | Admitting: Obstetrics & Gynecology

## 2019-09-18 ENCOUNTER — Encounter: Payer: Self-pay | Admitting: Obstetrics & Gynecology

## 2019-09-18 ENCOUNTER — Other Ambulatory Visit: Payer: Self-pay

## 2019-09-18 ENCOUNTER — Ambulatory Visit: Payer: BLUE CROSS/BLUE SHIELD | Admitting: Obstetrics & Gynecology

## 2019-09-18 VITALS — BP 110/70 | Ht 61.5 in | Wt 144.5 lb

## 2019-09-18 DIAGNOSIS — Z30431 Encounter for routine checking of intrauterine contraceptive device: Secondary | ICD-10-CM | POA: Diagnosis not present

## 2019-09-18 DIAGNOSIS — Z01419 Encounter for gynecological examination (general) (routine) without abnormal findings: Secondary | ICD-10-CM | POA: Diagnosis not present

## 2019-09-18 DIAGNOSIS — B977 Papillomavirus as the cause of diseases classified elsewhere: Secondary | ICD-10-CM

## 2019-09-18 DIAGNOSIS — Z1329 Encounter for screening for other suspected endocrine disorder: Secondary | ICD-10-CM | POA: Diagnosis not present

## 2019-09-18 DIAGNOSIS — E559 Vitamin D deficiency, unspecified: Secondary | ICD-10-CM | POA: Diagnosis not present

## 2019-09-18 LAB — COMPREHENSIVE METABOLIC PANEL
AG Ratio: 2 (calc) (ref 1.0–2.5)
ALT: 14 U/L (ref 6–29)
AST: 16 U/L (ref 10–35)
Albumin: 4.5 g/dL (ref 3.6–5.1)
Alkaline phosphatase (APISO): 46 U/L (ref 31–125)
BUN: 18 mg/dL (ref 7–25)
CO2: 27 mmol/L (ref 20–32)
Calcium: 9.7 mg/dL (ref 8.6–10.2)
Chloride: 105 mmol/L (ref 98–110)
Creat: 0.83 mg/dL (ref 0.50–1.10)
Globulin: 2.2 g/dL (calc) (ref 1.9–3.7)
Glucose, Bld: 87 mg/dL (ref 65–99)
Potassium: 4.2 mmol/L (ref 3.5–5.3)
Sodium: 140 mmol/L (ref 135–146)
Total Bilirubin: 0.4 mg/dL (ref 0.2–1.2)
Total Protein: 6.7 g/dL (ref 6.1–8.1)

## 2019-09-18 LAB — LIPID PANEL
Cholesterol: 200 mg/dL — ABNORMAL HIGH (ref <200)
HDL: 87 mg/dL (ref >=50)
LDL Cholesterol (Calc): 100 mg/dL (calc) — ABNORMAL HIGH
Non-HDL Cholesterol (Calc): 113 mg/dL (calc) (ref <130)
Total CHOL/HDL Ratio: 2.3 (calc) (ref <5.0)
Triglycerides: 50 mg/dL (ref <150)

## 2019-09-18 LAB — CBC
HCT: 39 % (ref 35.0–45.0)
Hemoglobin: 12.7 g/dL (ref 11.7–15.5)
MCH: 27.4 pg (ref 27.0–33.0)
MCHC: 32.6 g/dL (ref 32.0–36.0)
MCV: 84.2 fL (ref 80.0–100.0)
MPV: 10.1 fL (ref 7.5–12.5)
Platelets: 320 10*3/uL (ref 140–400)
RBC: 4.63 10*6/uL (ref 3.80–5.10)
RDW: 13.1 % (ref 11.0–15.0)
WBC: 3 10*3/uL — ABNORMAL LOW (ref 3.8–10.8)

## 2019-09-18 LAB — TSH: TSH: 0.95 mIU/L

## 2019-09-18 LAB — VITAMIN D 25 HYDROXY (VIT D DEFICIENCY, FRACTURES): Vit D, 25-Hydroxy: 20 ng/mL — ABNORMAL LOW (ref 30–100)

## 2019-09-18 NOTE — Patient Instructions (Signed)
1. Encounter for routine gynecological examination with Papanicolaou smear of cervix Normal gynecologic exam.  History of high risk HPV positive, Pap reflex done today.  Breast exam normal status post bilateral augmentation.  Screening mammogram February 2021 was negative.  Good body mass index at 26.86.  Continue with fitness and healthy nutrition. - CBC - Comp Met (CMET) - Lipid panel - TSH - VITAMIN D 25 Hydroxy (Vit-D Deficiency, Fractures)  2. Encounter for routine checking of intrauterine contraceptive device (IUD) Time to change Mirena IUD after 5 years.  Mirena IUD well-tolerated and in good position.  Patient will follow up to remove and insert a new Mirena IUD.  3. High risk human papilloma virus (HPV) infection of cervix History of high risk HPV positive, but last year's Pap test was negative with negative high-risk HPV in March 2020.  Ellina, it was a pleasure seeing you today!  I will inform you of your results as soon as they are available.

## 2019-09-18 NOTE — Progress Notes (Signed)
Christy Haley [birth year] [id number]   History:    49 y.o. G3P2A1L2 Divorced x 6 yrs.  Daughters 42 and 75 yo.  RP:  Established patient presenting for annual gyn exam   HPI: Well on Mirena IUD x 08/2014.  No BTB.  No pelvic pain.  Abstinent.  Last Pap negative/HPV HR Neg 09/2018.  HPV 16-18-45 negative.  Colpo 10/2017 No dysplasia.  Urine/BMs Normal.  Breasts normal, bilateral augmentation.  BMI 26.86.  Good fitness and Healthy nutrition.  May come back here for fasting health labs, will check insurance.   Past medical history,surgical history, family history and social history were all reviewed and documented in the EPIC chart.  Gynecologic History No LMP recorded. (Menstrual status: IUD).  Obstetric History OB History  Gravida Para Term Preterm AB Living  _0 SAB TAB Ectopic Multiple Live Births  1            # Outcome Date GA Lbr Len/2nd Weight Sex Delivery Anes PTL Lv  3 SAB           2 Para     F Vag-Spont     1 Para     F Vag-Spont        ROS: A ROS was performed and pertinent positives and negatives are included in the history.  GENERAL: No fevers or chills. HEENT: No change in vision, no earache, sore throat or sinus congestion. NECK: No pain or stiffness. CARDIOVASCULAR: No chest pain or pressure. No palpitations. PULMONARY: No shortness of breath, cough or wheeze. GASTROINTESTINAL: No abdominal pain, nausea, vomiting or diarrhea, melena or bright red blood per rectum. GENITOURINARY: No urinary frequency, urgency, hesitancy or dysuria. MUSCULOSKELETAL: No joint or muscle pain, no back pain, no recent trauma. DERMATOLOGIC: No rash, no itching, no lesions. ENDOCRINE: No polyuria, polydipsia, no heat or cold intolerance. No recent change in weight. HEMATOLOGICAL: No anemia or easy bruising or bleeding. NEUROLOGIC: No headache, seizures, numbness, tingling or weakness. PSYCHIATRIC: No depression, no loss of interest in normal activity or change in sleep  pattern.     Exam:   BP 110/70   Ht 5' 1.5" (1.562 m)   Wt 144 lb 8 oz (65.5 kg)   BMI 26.86 kg/m   Body mass index is 26.86 kg/m.  General appearance : Well developed well nourished female. No acute distress HEENT: Eyes: no retinal hemorrhage or exudates,  Neck supple, trachea midline, no carotid bruits, no thyroidmegaly Lungs: Clear to auscultation, no rhonchi or wheezes, or rib retractions  Heart: Regular rate and rhythm, no murmurs or gallops Breast:Examined in sitting and supine position were symmetrical in appearance with bilateral augmentation, no palpable masses or tenderness,  no skin retraction, no nipple inversion, no nipple discharge, no skin discoloration, no axillary or supraclavicular lymphadenopathy Abdomen: no palpable masses or tenderness, no rebound or guarding Extremities: no edema or skin discoloration or tenderness  Pelvic: Vulva: Normal             Vagina: No gross lesions or discharge  Cervix: No gross lesions or discharge.  Pap reflex done.  Uterus  AV, normal size, shape and consistency, non-tender and mobile  Adnexa  Without masses or tenderness  Anus: Normal   Assessment/Plan:  49 y.o. female for annual exam   1. Encounter for routine gynecological examination with Papanicolaou smear of cervix Normal gynecologic exam.  History of high risk HPV positive, Pap reflex done today.  Breast exam  normal status post bilateral augmentation.  Screening mammogram February 2021 was negative.  Good body mass index at 26.86.  Continue with fitness and healthy nutrition. - CBC - Comp Met (CMET) - Lipid panel - TSH - VITAMIN D 25 Hydroxy (Vit-D Deficiency, Fractures)  2. Encounter for routine checking of intrauterine contraceptive device (IUD) Time to change Mirena IUD after 5 years.  Mirena IUD well-tolerated and in good position.  Patient will follow up to remove and insert a new Mirena IUD.  3. 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  Princess Bruins MD, 8:22 AM 09/18/2019

## 2019-09-18 NOTE — Addendum Note (Signed)
Addended by: Berna Spare A on: 09/18/2019 09:11 AM   Modules accepted: Orders

## 2019-09-19 DIAGNOSIS — M25532 Pain in left wrist: Secondary | ICD-10-CM | POA: Diagnosis not present

## 2019-09-20 LAB — PAP IG W/ RFLX HPV ASCU

## 2019-09-24 ENCOUNTER — Other Ambulatory Visit: Payer: Self-pay | Admitting: Obstetrics & Gynecology

## 2019-09-24 DIAGNOSIS — E559 Vitamin D deficiency, unspecified: Secondary | ICD-10-CM

## 2019-09-24 DIAGNOSIS — D72818 Other decreased white blood cell count: Secondary | ICD-10-CM

## 2019-09-24 MED ORDER — VITAMIN D (ERGOCALCIFEROL) 1.25 MG (50000 UNIT) PO CAPS: 50000.0000 [IU] | ORAL_CAPSULE | ORAL | 0 refills | Status: DC

## 2019-09-26 DIAGNOSIS — S52502D Unspecified fracture of the lower end of left radius, subsequent encounter for closed fracture with routine healing: Secondary | ICD-10-CM | POA: Diagnosis not present

## 2019-09-26 DIAGNOSIS — M25512 Pain in left shoulder: Secondary | ICD-10-CM | POA: Diagnosis not present

## 2019-09-26 DIAGNOSIS — M25532 Pain in left wrist: Secondary | ICD-10-CM | POA: Diagnosis not present

## 2019-10-03 DIAGNOSIS — M25532 Pain in left wrist: Secondary | ICD-10-CM | POA: Diagnosis not present

## 2019-10-24 DIAGNOSIS — M25532 Pain in left wrist: Secondary | ICD-10-CM | POA: Diagnosis not present

## 2019-10-31 DIAGNOSIS — S52502D Unspecified fracture of the lower end of left radius, subsequent encounter for closed fracture with routine healing: Secondary | ICD-10-CM | POA: Diagnosis not present

## 2019-11-05 ENCOUNTER — Other Ambulatory Visit: Payer: Self-pay

## 2019-11-06 ENCOUNTER — Ambulatory Visit: Payer: BLUE CROSS/BLUE SHIELD | Admitting: Obstetrics & Gynecology

## 2019-11-06 VITALS — BP 114/78

## 2019-11-06 DIAGNOSIS — Z30433 Encounter for removal and reinsertion of intrauterine contraceptive device: Secondary | ICD-10-CM

## 2019-11-06 DIAGNOSIS — Z780 Asymptomatic menopausal state: Secondary | ICD-10-CM

## 2019-11-06 NOTE — Progress Notes (Signed)
    Christy Haley [birth year] [id number]        49 y.o.  [id number]   RP: Mirena IUD removal and insertion of a new Mirena IUD  HPI: Well on Mirena IUD x 5 yrs.  FSH 08/2017 at 79.7.     OB History  Gravida Para Term Preterm AB Living  3 2     1 2   SAB TAB Ectopic Multiple Live Births  1            # Outcome Date GA Lbr Len/2nd Weight Sex Delivery Anes PTL Lv  3 SAB           2 Para     F Vag-Spont     1 Para     F Vag-Spont       Past medical history,surgical history, problem list, medications, allergies, family history and social history were all reviewed and documented in the EPIC chart.   Directed ROS with pertinent positives and negatives documented in the history of present illness/assessment and plan.  Exam:  Vitals:   11/06/19 1610  BP: 114/78   General appearance:  Normal                                                                    IUD procedure note       Patient presented to the office today for removal and placement of Mirena IUD. The patient had previously been provided with literature information on this method of contraception. The risks benefits and pros and cons were discussed and all her questions were answered. She is fully aware that this form of contraception is 99% effective and is good for 5 years.  Pelvic exam: Vulva normal Vagina: No lesions or discharge Cervix: No lesions or discharge.  IUD strings visible at the EO.  Easy removal of IUD by pulling on strings.  IUD intact, complete. Uterus: Intermediate position Adnexa: No masses or tenderness Rectal exam: Not done  The cervix was cleansed with Betadine solution. Hurricane spray on the cervix.  A single-tooth tenaculum was placed on the anterior cervical lip.  Os finder for mild dilation of cervix.  Hysterometry with the Os Finder was at 7 cm.  The IUD was shown to the patient and inserted in a sterile fashion.  The IUD string was trimmed. The single-tooth tenaculum was removed.  Patient was instructed to return back to the office in one month for follow up.        Assessment/Plan:  49 y.o. 52   1. Encounter for removal and reinsertion of intrauterine contraceptive device (IUD) Easy removal of Mirena IUD without complication.  Insertion of a new Mirena IUD without difficulty.  Well-tolerated by patient.  No complication.  Post insertion precautions reviewed.  Follow-up in 4 weeks for IUD check.  2. Menopause Well on no estrogen replacement therapy.  Given that her menopause was diagnosed early at age 76, prefers to continue with contraception for one more Mirena IUD.  49 MD, 4:22 PM 11/06/2019

## 2019-11-07 ENCOUNTER — Encounter: Payer: Self-pay | Admitting: Obstetrics & Gynecology

## 2019-11-07 NOTE — Patient Instructions (Signed)
1. Encounter for removal and reinsertion of intrauterine contraceptive device (IUD) Easy removal of Mirena IUD without complication.  Insertion of a new Mirena IUD without difficulty.  Well-tolerated by patient.  No complication.  Post insertion precautions reviewed.  Follow-up in 4 weeks for IUD check.  2. Menopause Well on no estrogen replacement therapy.  Given that her menopause was diagnosed early at age 49, prefers to continue with contraception for one more Mirena IUD.  Ernst Breach un placer verle hoy!

## 2019-11-28 DIAGNOSIS — M25532 Pain in left wrist: Secondary | ICD-10-CM | POA: Diagnosis not present

## 2019-12-05 ENCOUNTER — Other Ambulatory Visit: Payer: Self-pay

## 2019-12-05 ENCOUNTER — Ambulatory Visit (INDEPENDENT_AMBULATORY_CARE_PROVIDER_SITE_OTHER): Payer: BLUE CROSS/BLUE SHIELD | Admitting: Obstetrics & Gynecology

## 2019-12-05 ENCOUNTER — Encounter: Payer: Self-pay | Admitting: Obstetrics & Gynecology

## 2019-12-05 VITALS — BP 122/74

## 2019-12-05 DIAGNOSIS — Z30431 Encounter for routine checking of intrauterine contraceptive device: Secondary | ICD-10-CM

## 2019-12-05 NOTE — Patient Instructions (Signed)
1. Encounter for routine checking of intrauterine contraceptive device (IUD) Mirena IUD confirmed in good position with no sign of infection.  Mild cramping, but well tolerated by patient. Precautions reviewed.  Ernst Breach un placer verle hoy!

## 2019-12-05 NOTE — Progress Notes (Signed)
    Christy Haley 04/19/1971 [id number]        49 y.o.  [id number]   RP: Mirena IUD check 4 weeks post insertion  HPI: Well since the Mirena IUD insertion.  No BTB.  Mild cramps, improving.  No abnormal discharge.  No fever.  Urine/BMs normal.   OB History  Gravida Para Term Preterm AB Living  3 2     1 2   SAB TAB Ectopic Multiple Live Births  1            # Outcome Date GA Lbr Len/2nd Weight Sex Delivery Anes PTL Lv  3 SAB           2 Para     F Vag-Spont     1 Para     F Vag-Spont       Past medical history,surgical history, problem list, medications, allergies, family history and social history were all reviewed and documented in the EPIC chart.   Directed ROS with pertinent positives and negatives documented in the history of present illness/assessment and plan.  Exam:  Vitals:   12/05/19 0808  BP: 122/74   General appearance:  Normal  Abdomen: Normal  Gynecologic exam: Vulva normal.  Speculum:  Cervix normal.  IUD strings visible at the external os.  Vagina normal.  No blood.  Normal secretions.   Assessment/Plan:  49 y.o. 52   1. Encounter for routine checking of intrauterine contraceptive device (IUD) Mirena IUD confirmed in good position with no sign of infection.  Mild cramping, but well tolerated by patient.  Precautions reviewed.  [id number] MD, 8:24 AM 12/05/2019

## 2019-12-15 ENCOUNTER — Other Ambulatory Visit: Payer: Self-pay | Admitting: Obstetrics & Gynecology

## 2019-12-15 DIAGNOSIS — E559 Vitamin D deficiency, unspecified: Secondary | ICD-10-CM

## 2019-12-19 DIAGNOSIS — M25532 Pain in left wrist: Secondary | ICD-10-CM | POA: Diagnosis not present

## 2020-01-15 DIAGNOSIS — M25532 Pain in left wrist: Secondary | ICD-10-CM | POA: Diagnosis not present

## 2020-01-23 ENCOUNTER — Other Ambulatory Visit: Payer: Self-pay | Admitting: Family Medicine

## 2020-01-23 NOTE — Telephone Encounter (Signed)
Requested Prescriptions  Pending Prescriptions Disp Refills  . hydrOXYzine (ATARAX/VISTARIL) 25 MG tablet [Pharmacy Med Name: HYDROXYZINE HCL 25MG  TABS (WHITE)] 90 tablet 0    Sig: TAKE 1 TABLET(25 MG) BY MOUTH AT BEDTIME AS NEEDED FOR ANXIETY     Ear, Nose, and Throat:  Antihistamines Passed - 01/23/2020  3:55 AM      Passed - Valid encounter within last 12 months    Recent Outpatient Visits          9 months ago Generalized anxiety disorder   Primary Care at 01/25/2020, Oneita Jolly, [place]   1 year ago Generalized anxiety disorder   Primary Care at [facility], [place], [place]   2 years ago Right wrist pain   Primary Care at [place], Sharlene Motts, MD   2 years ago Acute pain of right knee   Primary Care at [facility], [place], [place]   3 years ago Generalized anxiety disorder   Primary Care at [facility], [place], [place]      Future Appointments            In 7 months [place], MD [facility], [facility]

## 2020-02-13 DIAGNOSIS — M25532 Pain in left wrist: Secondary | ICD-10-CM | POA: Diagnosis not present

## 2020-03-05 DIAGNOSIS — M25532 Pain in left wrist: Secondary | ICD-10-CM | POA: Diagnosis not present

## 2020-03-26 DIAGNOSIS — M25532 Pain in left wrist: Secondary | ICD-10-CM | POA: Diagnosis not present

## 2020-04-11 DIAGNOSIS — M25532 Pain in left wrist: Secondary | ICD-10-CM | POA: Diagnosis not present

## 2020-04-25 DIAGNOSIS — M25532 Pain in left wrist: Secondary | ICD-10-CM | POA: Diagnosis not present

## 2020-05-09 DIAGNOSIS — M25532 Pain in left wrist: Secondary | ICD-10-CM | POA: Diagnosis not present

## 2020-05-23 DIAGNOSIS — M25532 Pain in left wrist: Secondary | ICD-10-CM | POA: Diagnosis not present

## 2020-05-28 DIAGNOSIS — M25532 Pain in left wrist: Secondary | ICD-10-CM | POA: Diagnosis not present

## 2020-05-28 DIAGNOSIS — M25511 Pain in right shoulder: Secondary | ICD-10-CM | POA: Diagnosis not present

## 2020-06-04 ENCOUNTER — Ambulatory Visit: Payer: BLUE CROSS/BLUE SHIELD | Admitting: Family Medicine

## 2020-06-04 ENCOUNTER — Encounter: Payer: Self-pay | Admitting: Family Medicine

## 2020-06-04 ENCOUNTER — Other Ambulatory Visit: Payer: Self-pay

## 2020-06-04 DIAGNOSIS — F3341 Major depressive disorder, recurrent, in partial remission: Secondary | ICD-10-CM

## 2020-06-04 DIAGNOSIS — F411 Generalized anxiety disorder: Secondary | ICD-10-CM

## 2020-06-04 MED ORDER — HYDROXYZINE HCL 25 MG PO TABS: ORAL_TABLET | ORAL | 0 refills | Status: DC

## 2020-06-04 MED ORDER — BUPROPION HCL ER (XL) 300 MG PO TB24: 300.0000 mg | ORAL_TABLET | Freq: Every day | ORAL | 3 refills | Status: DC

## 2020-06-04 NOTE — Patient Instructions (Addendum)
Living With Depression Everyone experiences occasional disappointment, sadness, and loss in their lives. When you are feeling down, blue, or sad for at least 2 weeks in a row, it may mean that you have depression. Depression can affect your thoughts and feelings, relationships, daily activities, and physical health. It is caused by changes in the way your brain functions. If you receive a diagnosis of depression, your health care provider will tell you which type of depression you have and what treatment options are available to you. If you are living with depression, there are ways to help you recover from it and also ways to prevent it from coming back. How to cope with lifestyle changes Coping with stress     Stress is your body's reaction to life changes and events, both good and bad. Stressful situations may include:  Getting married.  The death of a spouse.  Losing a job.  Retiring.  Having a baby. Stress can last just a few hours or it can be ongoing. Stress can play a major role in depression, so it is important to learn both how to cope with stress and how to think about it differently. Talk with your health care provider or a counselor if you would like to learn more about stress reduction. He or she may suggest some stress reduction techniques, such as:  Music therapy. This can include creating music or listening to music. Choose music that you enjoy and that inspires you.  Mindfulness-based meditation. This kind of meditation can be done while sitting or walking. It involves being aware of your normal breaths, rather than trying to control your breathing.  Centering prayer. This is a kind of meditation that involves focusing on a spiritual word or phrase. Choose a word, phrase, or sacred image that is meaningful to you and that brings you peace.  Deep breathing. To do this, expand your stomach and inhale slowly through your nose. Hold your breath for 3-5 seconds, then  exhale slowly, allowing your stomach muscles to relax.  Muscle relaxation. This involves intentionally tensing muscles then relaxing them. Choose a stress reduction technique that fits your lifestyle and personality. Stress reduction techniques take time and practice to develop. Set aside 5-15 minutes a day to do them. Therapists can offer training in these techniques. The training may be covered by some insurance plans. Other things you can do to manage stress include:  Keeping a stress diary. This can help you learn what triggers your stress and ways to control your response.  Understanding what your limits are and saying no to requests or events that lead to a schedule that is too full.  Thinking about how you respond to certain situations. You may not be able to control everything, but you can control how you react.  Adding humor to your life by watching funny films or TV shows.  Making time for activities that help you relax and not feeling guilty about spending your time this way.  Medicines Your health care provider may suggest certain medicines if he or she feels that they will help improve your condition. Avoid using alcohol and other substances that may prevent your medicines from working properly (may interact). It is also important to:  Talk with your pharmacist or health care provider about all the medicines that you take, their possible side effects, and what medicines are safe to take together.  Make it your goal to take part in all treatment decisions (shared decision-making). This includes giving input  on the side effects of medicines. It is best if shared decision-making with your health care provider is part of your total treatment plan. If your health care provider prescribes a medicine, you may not notice the full benefits of it for 4-8 weeks. Most people who are treated for depression need to be on medicine for at least 6-12 months after they feel better. If you are taking  medicines as part of your treatment, do not stop taking medicines without first talking to your health care provider. You may need to have the medicine slowly decreased (tapered) over time to decrease the risk of harmful side effects. Relationships Your health care provider may suggest family therapy along with individual therapy and drug therapy. While there may not be family problems that are causing you to feel depressed, it is still important to make sure your family learns as much as they can about your mental health. Having your family's support can help make your treatment successful. How to recognize changes in your condition Everyone has a different response to treatment for depression. Recovery from major depression happens when you have not had signs of major depression for two months. This may mean that you will start to:  Have more interest in doing activities.  Feel less hopeless than you did 2 months ago.  Have more energy.  Overeat less often, or have better or improving appetite.  Have better concentration. Your health care provider will work with you to decide the next steps in your recovery. It is also important to recognize when your condition is getting worse. Watch for these signs:  Having fatigue or low energy.  Eating too much or too little.  Sleeping too much or too little.  Feeling restless, agitated, or hopeless.  Having trouble concentrating or making decisions.  Having unexplained physical complaints.  Feeling irritable, angry, or aggressive. Get help as soon as you or your family members notice these symptoms coming back. How to get support and help from others How to talk with friends and family members about your condition  Talking to friends and family members about your condition can provide you with one way to get support and guidance. Reach out to trusted friends or family members, explain your symptoms to them, and let them know that you are  working with a health care provider to treat your depression. Financial resources Not all insurance plans cover mental health care, so it is important to check with your insurance carrier. If paying for co-pays or counseling services is a problem, search for a local or county mental health care center. They may be able to offer public mental health care services at low or no cost when you are not able to see a private health care provider. If you are taking medicine for depression, you may be able to get the generic form, which may be less expensive. Some makers of prescription medicines also offer help to patients who cannot afford the medicines they need. Follow these instructions at home:   Get the right amount and quality of sleep.  Cut down on using caffeine, tobacco, alcohol, and other potentially harmful substances.  Try to exercise, such as walking or lifting small weights.  Take over-the-counter and prescription medicines only as told by your health care provider.  Eat a healthy diet that includes plenty of vegetables, fruits, whole grains, low-fat dairy products, and lean protein. Do not eat a lot of foods that are high in solid fats, added sugars, or  salt.  Keep all follow-up visits as told by your health care provider. This is important. Contact a health care provider if:  You stop taking your antidepressant medicines, and you have any of these symptoms: ? Nausea. ? Headache. ? Feeling lightheaded. ? Chills and body aches. ? Not being able to sleep (insomnia).  You or your friends and family think your depression is getting worse. Get help right away if:  You have thoughts of hurting yourself or others. If you ever feel like you may hurt yourself or others, or have thoughts about taking your own life, get help right away. You can go to your nearest emergency department or call:  Your local emergency services (911 in the [place]  A suicide crisis helpline, such as the  National Suicide Prevention Lifeline at (709)341-0496. This is open 24-hours a day. Summary  If you are living with depression, there are ways to help you recover from it and also ways to prevent it from coming back.  Work with your health care team to create a management plan that includes counseling, stress management techniques, and healthy lifestyle habits. This information is not intended to replace advice given to you by your health care provider. Make sure you discuss any questions you have with your health care provider. Document Revised: 10/20/2018 Document Reviewed: 05/31/2016 Elsevier Patient Education  [employer].  If you have lab work done today you will be contacted with your lab results within the next 2 weeks.  If you have not heard from [place] then please contact us. The fastest way to get your results is to register for My Chart.   IF you received an x-ray today, you will receive an invoice from [facility]. Please contact [facility] at [phone] with questions or concerns regarding your invoice.   IF you received labwork today, you will receive an invoice from [place]. Please contact [facility] at [phone] with questions or concerns regarding your invoice.   Our billing staff will not be able to assist you with questions regarding bills from these companies.  You will be contacted with the lab results as soon as they are available. The fastest way to get your results is to activate your My Chart account. Instructions are located on the last page of this paperwork. If you have not heard from [place] regarding the results in 2 weeks, please contact this office.

## 2020-06-04 NOTE — Progress Notes (Addendum)
11/24/20218:56 AM  Christy Haley [birth year], 49 y.o., female [id number]  Chief Complaint  Patient presents with  . Anxiety/ depression    welbutrin refill    HPI:   Patient is a 49 y.o. female with past medical history significant for GERD, Depression who presents today for medication refill.  Depression If she takes this daily her anxiety and depression improve Taking hydroxyzine for insomnia, works well for this Has been on wellbutrin dose for a long time Last year had stopped medication completely Is in counseling weekly Multiple siblings and daughter with depression   Depression screen [employer] [facility] [facility] [employer] [facility] 2/9 06/04/2020 04/20/2019 08/25/2017  Decreased Interest 1 0 1  Down, Depressed, Hopeless 1 0 0  PHQ - 2 Score 2 0 1  Altered sleeping 1 - -  Tired, decreased energy 1 - -  Change in appetite 1 - -  Feeling bad or failure about yourself  2 - -  Trouble concentrating 2 - -  Moving slowly or fidgety/restless 1 - -  Suicidal thoughts 0 - -  PHQ-9 Score 10 - -  Difficult doing work/chores Somewhat difficult - -    Fall Risk  06/04/2020 04/20/2019 08/25/2017 12/17/2016  Falls in the past year? 1 0 No No  Number falls in past yr: 0 0 - -  Injury with Fall? 1 0 - -  Follow up Falls evaluation completed - - -     Allergies  Allergen Reactions  . Oxycodone Rash    Prior to Admission medications   Medication Sig Start Date End Date Taking? Authorizing Provider  buPROPion (WELLBUTRIN XL) 300 MG 24 hr tablet Take 1 tablet (300 mg total) by mouth daily. 04/20/19   Myles Lipps, MD  calcium carbonate (OS-CAL) 600 MG TABS tablet Take 600 mg by mouth 2 (two) times daily with a meal.    [provider]  COLLAGEN PO Take by mouth.    [provider]  hydrOXYzine (ATARAX/VISTARIL) 25 MG tablet TAKE 1 TABLET(25 MG) BY MOUTH AT BEDTIME AS NEEDED FOR ANXIETY 01/23/20   Myles Lipps, MD  ibuprofen (ADVIL) 600 MG tablet Take 1 tablet (600 mg total) by mouth every  6 (six) hours as needed. 06/16/19   Fawze, Mina A, PA-C  Multiple Vitamin (MULTIVITAMIN) tablet Take 1 tablet by mouth daily.      [provider]  Vitamin D, Ergocalciferol, (DRISDOL) 1.25 MG (50000 UNIT) CAPS capsule Take 1 capsule (50,000 Units total) by mouth every 7 (seven) days. 09/24/19   Genia Del, MD    Past Medical History:  Diagnosis Date  . Anxiety   . Depression   . GERD (gastroesophageal reflux disease)     Past Surgical History:  Procedure Laterality Date  . AUGMENTATION MAMMAPLASTY  10/09/2015   breast lift and implants   . INTRAUTERINE DEVICE INSERTION  2010   Mirena  . KNEE SURGERY Right   . PELVIC LAPAROSCOPY     ovarian cystectomy  . WRIST SURGERY Left 06/21/2019   plate/     Social History   Tobacco Use  . Smoking status: Never Smoker  . Smokeless tobacco: Never Used  Substance Use Topics  . Alcohol use: No    Alcohol/week: 0.0 standard drinks    Family History  Problem Relation Age of Onset  . Arthritis Mother   . Hypertension Mother   . Diabetes Maternal Grandmother   . Heart disease Neg Hx   . Hyperlipidemia Neg Hx   . Kidney disease Neg  Hx   . Stroke Neg Hx   . Alcohol abuse Neg Hx   . Cancer Neg Hx   . COPD Neg Hx   . Early death Neg Hx   . Breast cancer Neg Hx     Review of Systems  Respiratory: Negative for shortness of breath.   Cardiovascular: Negative for chest pain.  Gastrointestinal: Negative for abdominal pain, diarrhea, nausea and vomiting.  Neurological: Negative for headaches.  Psychiatric/Behavioral: Positive for depression. Negative for substance abuse and suicidal ideas. The patient is nervous/anxious and has insomnia.      OBJECTIVE:  Today's Vitals   06/04/20 0838  BP: 117/74  Pulse: 62  Temp: 98 F (36.7 C)  SpO2: 99%  Weight: 143 lb (64.9 kg)  Height: 5' 1.5" (1.562 m)   Body mass index is 26.58 kg/m.   Physical Exam Constitutional:      General: She is not in acute distress.     Appearance: Normal appearance. She is not ill-appearing.  HENT:     Head: Normocephalic.  Cardiovascular:     Rate and Rhythm: Normal rate and regular rhythm.     Pulses: Normal pulses.     Heart sounds: Normal heart sounds. No murmur heard.  No friction rub. No gallop.   Pulmonary:     Effort: Pulmonary effort is normal. No respiratory distress.     Breath sounds: Normal breath sounds. No stridor. No wheezing, rhonchi or rales.  Abdominal:     General: Bowel sounds are normal.     Palpations: Abdomen is soft.     Tenderness: There is no abdominal tenderness.  Musculoskeletal:     Right lower leg: No edema.     Left lower leg: No edema.  Skin:    General: Skin is warm and dry.  Neurological:     Mental Status: She is alert and oriented to person, place, and time.  Psychiatric:        Mood and Affect: Mood normal.        Behavior: Behavior normal.     No results found for this or any previous visit (from the past 24 hour(s)).  No results found.   ASSESSMENT and PLAN  Problem List Items Addressed This Visit      Other   Generalized anxiety disorder   Relevant Medications   buPROPion (WELLBUTRIN XL) 300 MG 24 hr tablet   hydrOXYzine (ATARAX/VISTARIL) 25 MG tablet   Recurrent major depressive disorder, in partial remission (HCC)   Relevant Medications   buPROPion (WELLBUTRIN XL) 300 MG 24 hr tablet   hydrOXYzine (ATARAX/VISTARIL) 25 MG tablet     Stable on current regimen Will follow up at next visit Refills sent.  Return for Transfer of care.   Macario Carls Christy Rivard, FNP-BC Primary Care at [facility] [address] [place], [place] [address] Ph.  [phone] Fax [phone]  I have reviewed and agree with above documentation. Edwina Barth, MD

## 2020-06-09 DIAGNOSIS — M25511 Pain in right shoulder: Secondary | ICD-10-CM | POA: Diagnosis not present

## 2020-06-09 DIAGNOSIS — M25532 Pain in left wrist: Secondary | ICD-10-CM | POA: Diagnosis not present

## 2020-07-22 ENCOUNTER — Other Ambulatory Visit: Payer: Self-pay

## 2020-07-22 ENCOUNTER — Ambulatory Visit: Payer: 59 | Admitting: Registered Nurse

## 2020-07-22 VITALS — BP 114/67 | HR 58 | Temp 98.1°F | Resp 15 | Ht 61.5 in | Wt 143.0 lb

## 2020-07-22 DIAGNOSIS — D509 Iron deficiency anemia, unspecified: Secondary | ICD-10-CM

## 2020-07-22 DIAGNOSIS — R5382 Chronic fatigue, unspecified: Secondary | ICD-10-CM

## 2020-07-22 NOTE — Patient Instructions (Signed)
° ° ° °  If you have lab work done today you will be contacted with your lab results within the next 2 weeks.  If you have not heard from us then please contact us. The fastest way to get your results is to register for My Chart. ° ° °IF you received an x-ray today, you will receive an invoice from [facility]. Please contact [facility] at [phone] with questions or concerns regarding your invoice.  ° °IF you received labwork today, you will receive an invoice from [facility]. Please contact [facility] at [phone] with questions or concerns regarding your invoice.  ° °Our billing staff will not be able to assist you with questions regarding bills from these companies. ° °You will be contacted with the lab results as soon as they are available. The fastest way to get your results is to activate your My Chart account. Instructions are located on the last page of this paperwork. If you have not heard from us regarding the results in 2 weeks, please contact this office. °  ° ° ° °

## 2020-07-23 LAB — CBC
Hematocrit: 37.5 % (ref 34.0–46.6)
Hemoglobin: 12.2 g/dL (ref 11.1–15.9)
MCH: 27.2 pg (ref 26.6–33.0)
MCHC: 32.5 g/dL (ref 31.5–35.7)
MCV: 84 fL (ref 79–97)
Platelets: 307 10*3/uL (ref 150–450)
RBC: 4.48 x10E6/uL (ref 3.77–5.28)
RDW: 12.6 % (ref 11.7–15.4)
WBC: 4.9 10*3/uL (ref 3.4–10.8)

## 2020-07-23 LAB — IRON,TIBC AND FERRITIN PANEL
Ferritin: 47 ng/mL (ref 15–150)
Iron Saturation: 29 % (ref 15–55)
Iron: 89 ug/dL (ref 27–159)
Total Iron Binding Capacity: 305 ug/dL (ref 250–450)
UIBC: 216 ug/dL (ref 131–425)

## 2020-07-23 LAB — VITAMIN B12: Vitamin B-12: >2000 pg/mL — ABNORMAL HIGH (ref 232–1245)

## 2020-08-27 ENCOUNTER — Encounter: Payer: 59 | Admitting: Family Medicine

## 2020-09-18 ENCOUNTER — Encounter: Payer: BLUE CROSS/BLUE SHIELD | Admitting: Obstetrics & Gynecology

## 2020-10-05 ENCOUNTER — Other Ambulatory Visit: Payer: Self-pay | Admitting: Family Medicine

## 2020-10-05 DIAGNOSIS — F411 Generalized anxiety disorder: Secondary | ICD-10-CM

## 2020-10-31 IMAGING — MG DIGITAL SCREENING BREAST BILAT IMPLANT W/ TOMO W/ CAD
9 of 12 series · 9 of 28 positions shown · non-contrast
Comparison: Previous exam(s).

CLINICAL DATA: Screening.

EXAM:
DIGITAL SCREENING BILATERAL MAMMOGRAM WITH IMPLANTS, CAD AND TOMO

[R CC]
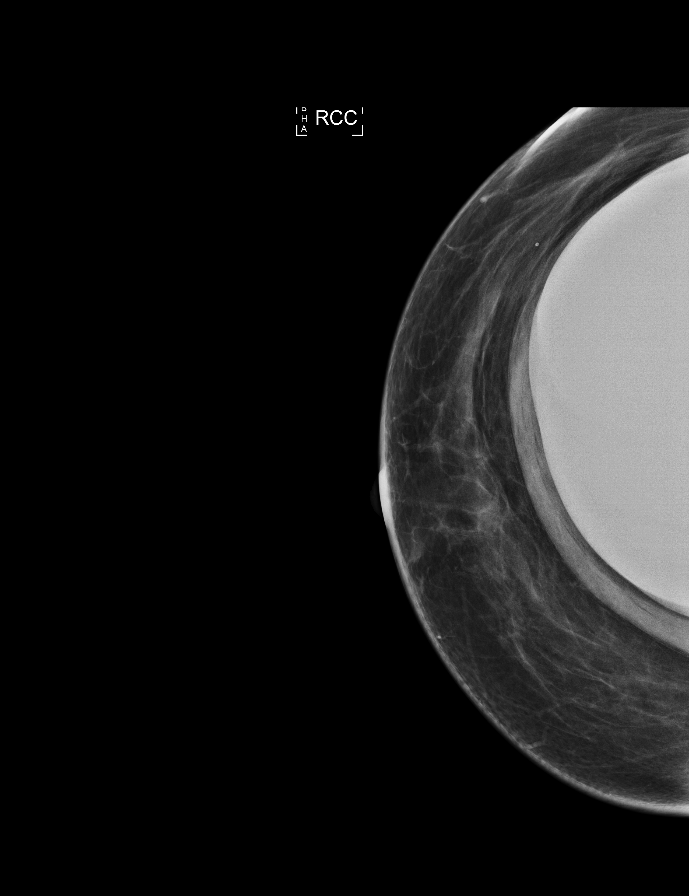

[L MLO]
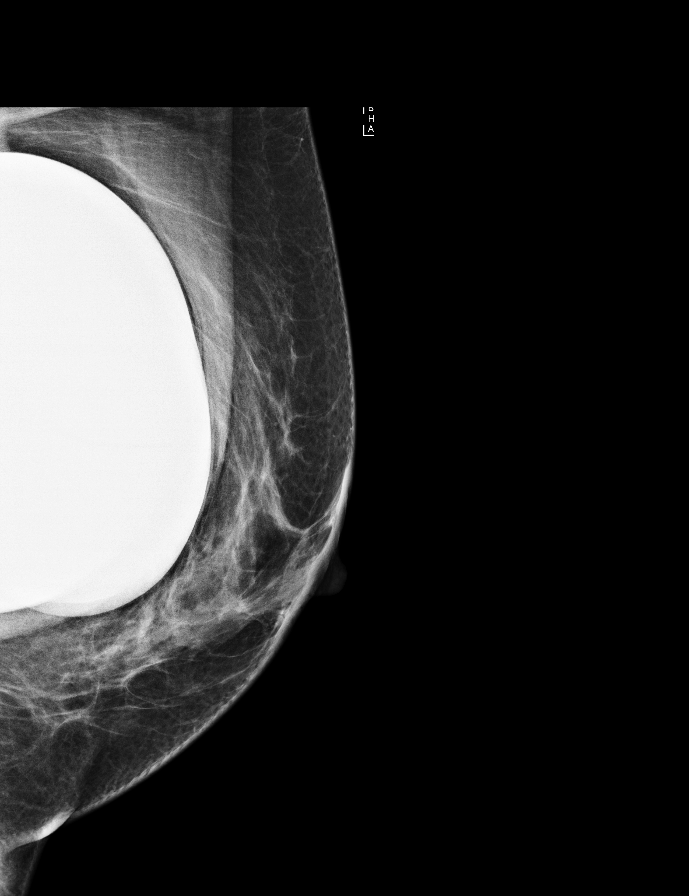

[R MLO]
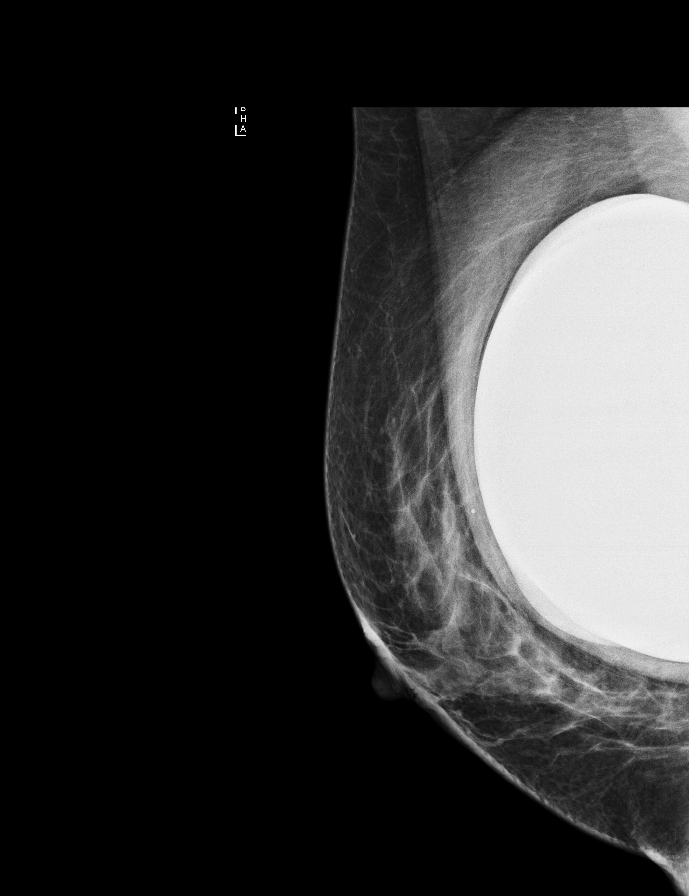

[L CC]
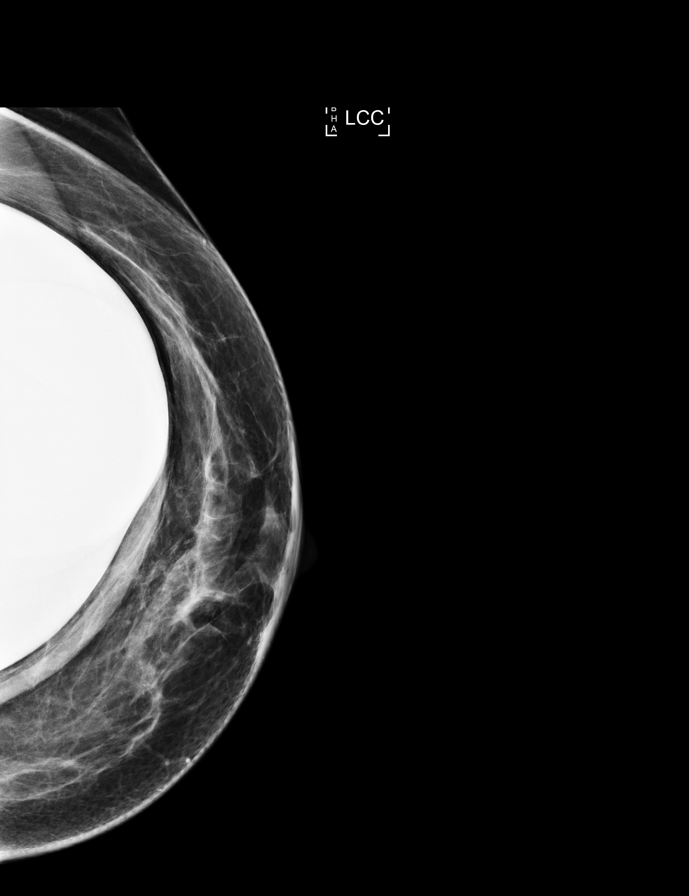

[L MLO synth-2D]
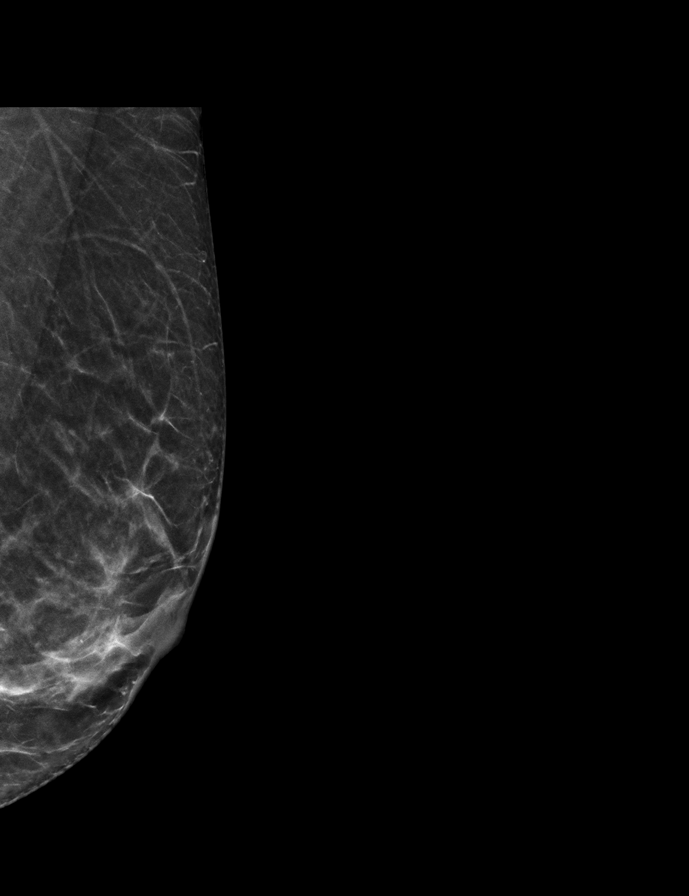

[L CC synth-2D]
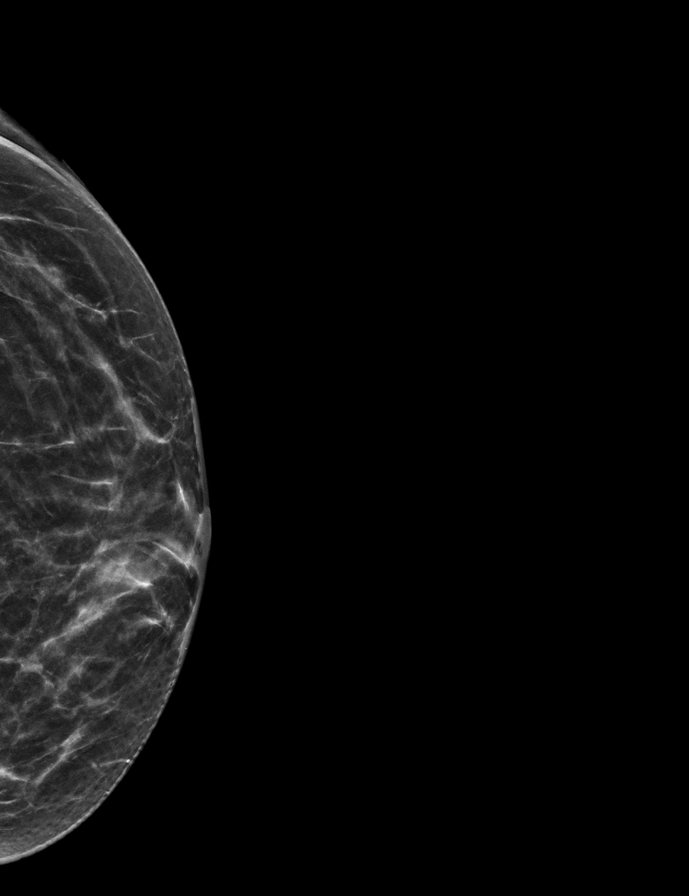

[R MLO synth-2D]
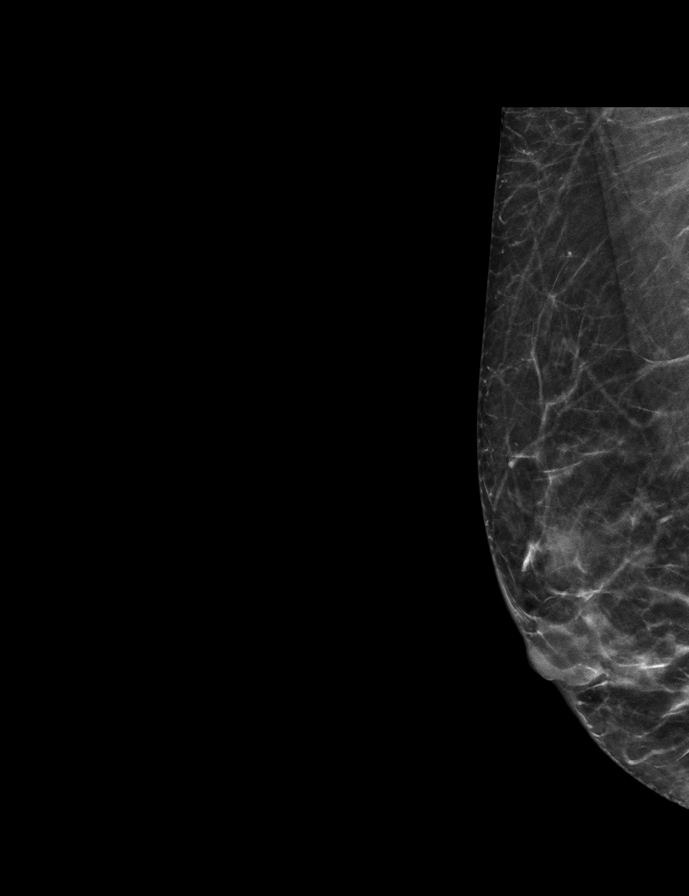

[R CC synth-2D]
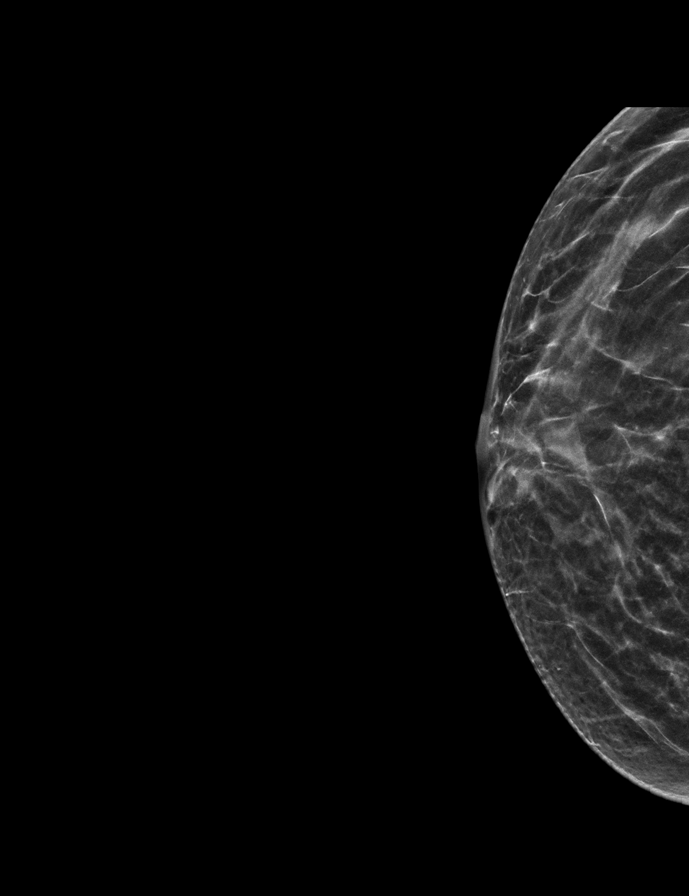

[R MLOID BREAST TOMOSYNTHESIS IMAGE tomo · tomo slice 23/44.0]
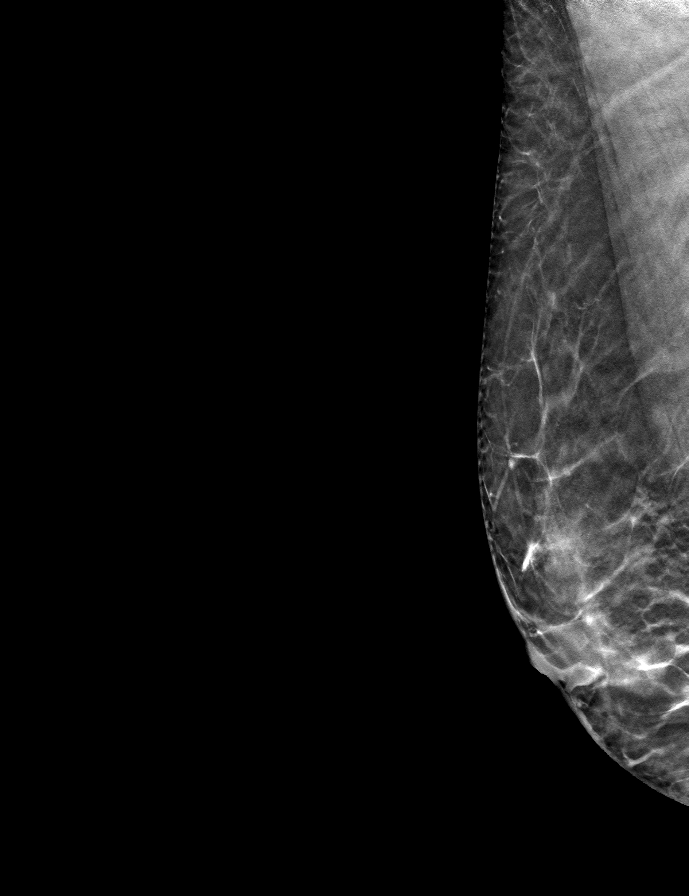

[9 of 28 positions shown; findings below may reference images not displayed]

ACR Breast Density Category b: There are scattered areas of
fibroglandular density.
FINDINGS: The patient has retropectoral implants. Standard 2D full field CC
and MLO views of both breasts and tomosynthesis and synthesized
implant displaced CC and MLO views of both breasts were obtained.

There are no findings suspicious for malignancy. Images were
processed with CAD.
IMPRESSION: No mammographic evidence of malignancy. A result letter of this
screening mammogram will be mailed directly to the patient.

RECOMMENDATION:
Screening mammogram in one year. (Code:YY-K-GXC)

BI-RADS CATEGORY  1:  Negative.

## 2020-11-18 ENCOUNTER — Other Ambulatory Visit (HOSPITAL_COMMUNITY)
Admission: RE | Admit: 2020-11-18 | Discharge: 2020-11-18 | Disposition: A | Discharge status: Home / Self Care | Payer: 59 | Source: Ambulatory Visit | Attending: Obstetrics & Gynecology | Admitting: Obstetrics & Gynecology

## 2020-11-18 ENCOUNTER — Other Ambulatory Visit: Payer: Self-pay

## 2020-11-18 ENCOUNTER — Ambulatory Visit (INDEPENDENT_AMBULATORY_CARE_PROVIDER_SITE_OTHER): Payer: 59 | Admitting: Obstetrics & Gynecology

## 2020-11-18 ENCOUNTER — Encounter: Payer: Self-pay | Admitting: Obstetrics & Gynecology

## 2020-11-18 VITALS — BP 118/80 | Ht 61.0 in | Wt 141.0 lb

## 2020-11-18 DIAGNOSIS — Z30431 Encounter for routine checking of intrauterine contraceptive device: Secondary | ICD-10-CM

## 2020-11-18 DIAGNOSIS — R5383 Other fatigue: Secondary | ICD-10-CM

## 2020-11-18 DIAGNOSIS — Z01419 Encounter for gynecological examination (general) (routine) without abnormal findings: Secondary | ICD-10-CM | POA: Diagnosis not present

## 2020-11-18 NOTE — Progress Notes (Signed)
Christy Haley 06-Feb-1971 [id number]   History:    50 y.o. G3P2A1L2Divorced x 7 yrs. Daughters 3 and 36 yo.  [id number] presenting for annual gyn exam   HEN:IDPO on Mirena IUD x 10/2019. No BTB. No pelvic pain. Abstinent. Last Pap negative/HPV HR Neg 09/2019. HPV 16-18-45 negative. Colpo 10/2017 No dysplasia. Urine/BMs Normal. Breasts normal, bilateral augmentation with recent revision because of encapsulation. BMI 26.64. Good fitness and Healthy nutrition. Feels tired, but no depressive Sx on Wellbutrin.  Health labs with Fam MD.  Past medical history,surgical history, family history and social history were all reviewed and documented in the EPIC chart.  Gynecologic History No LMP recorded (lmp unknown). (Menstrual status: IUD).  Obstetric History OB History  Gravida Para Term Preterm AB Living  3 2     1 2   SAB IAB Ectopic Multiple Live Births  1            # Outcome Date GA Lbr Len/2nd Weight Sex Delivery Anes PTL Lv  3 SAB           2 Para     F Vag-Spont     1 Para     F Vag-Spont        ROS: A ROS was performed and pertinent positives and negatives are included in the history.  GENERAL: No fevers or chills. HEENT: No change in vision, no earache, sore throat or sinus congestion. NECK: No pain or stiffness. CARDIOVASCULAR: No chest pain or pressure. No palpitations. PULMONARY: No shortness of breath, cough or wheeze. GASTROINTESTINAL: No abdominal pain, nausea, vomiting or diarrhea, melena or bright red blood per rectum. GENITOURINARY: No urinary frequency, urgency, hesitancy or dysuria. MUSCULOSKELETAL: No joint or muscle pain, no back pain, no recent trauma. DERMATOLOGIC: No rash, no itching, no lesions. ENDOCRINE: No polyuria, polydipsia, no heat or cold intolerance. No recent change in weight. HEMATOLOGICAL: No anemia or easy bruising or bleeding. NEUROLOGIC: No headache, seizures, numbness, tingling or weakness. PSYCHIATRIC: No depression, no  loss of interest in normal activity or change in sleep pattern.     Exam:   BP 118/80 (BP Location: Right Arm, Patient Position: Sitting, Cuff Size: Normal)   Ht 5\' 1"  (1.549 m)   Wt 141 lb (64 kg)   LMP  (LMP Unknown)   BMI 26.64 kg/m   Body mass index is 26.64 kg/m.  General appearance : Well developed well nourished female. No acute distress HEENT: Eyes: no retinal hemorrhage or exudates,  Neck supple, trachea midline, no carotid bruits, no thyroidmegaly.   Lungs: Clear to auscultation, no rhonchi or wheezes, or rib retractions  Heart: Regular rate and rhythm, no murmurs or gallops Breast:Examined in sitting and supine position were symmetrical in appearance, no palpable masses or tenderness,  no skin retraction, no nipple inversion, no nipple discharge, no skin discoloration, no axillary or supraclavicular lymphadenopathy Abdomen: no palpable masses or tenderness, no rebound or guarding Extremities: no edema or skin discoloration or tenderness  Pelvic: Vulva: Normal             Vagina: No gross lesions or discharge  Cervix: No gross lesions or discharge.  [facility]UD strings [facility] at [facility].  Pap reflex done.  Uterus  AV, normal size, shape and consistency, non-tender and mobile  Adnexa  Without masses or tenderness  Anus: Normal   Assessment/Plan:  50 y.o. female for annual exam   1. Encounter for routine gynecological examination with Papanicolaou smear of cervix Normal gynecologic exam.  Pap reflex  done.  Breast exam status post augmentation revision bilaterally.  We will schedule a screening mammogram now.  Body mass index 26.64.  Continue with fitness and healthy nutrition.  Health labs with family physician. - Cytology - PAP( [facility])  2. Encounter for routine checking of intrauterine contraceptive device (IUD) Well on Mirena IUD since April 2021.  IUD in good location.  3. Fatigue, unspecified type Fatigue with no depressive symptoms on Wellbutrin.  Will rule out  hypothyroidism with a TSH today.  Check FSH for menopausal status. - TSH - FSH  Genia Del MD, 8:14 AM 11/18/2020

## 2020-11-19 LAB — CYTOLOGY - PAP: Diagnosis: NEGATIVE

## 2020-11-19 LAB — TSH: TSH: 1.5 mIU/L

## 2020-11-19 LAB — FOLLICLE STIMULATING HORMONE: FSH: 68.6 m[IU]/mL

## 2021-06-01 ENCOUNTER — Other Ambulatory Visit: Payer: Self-pay | Admitting: Obstetrics & Gynecology

## 2021-06-01 DIAGNOSIS — Z1231 Encounter for screening mammogram for malignant neoplasm of breast: Secondary | ICD-10-CM

## 2021-07-02 ENCOUNTER — Ambulatory Visit
Admission: RE | Admit: 2021-07-02 | Discharge: 2021-07-02 | Disposition: A | Discharge status: Home / Self Care | Payer: 59 | Source: Ambulatory Visit

## 2021-07-02 DIAGNOSIS — Z1231 Encounter for screening mammogram for malignant neoplasm of breast: Secondary | ICD-10-CM

## 2021-08-29 NOTE — Progress Notes (Signed)
Established Patient Office Visit  Subjective:  Patient ID: Christy Haley, female    DOB: Nov 09, 1970  Age: 51 y.o. MRN: [id number]  CC:  Chief Complaint  Patient presents with   Fatigue    Pt has been feeling tired over past few months, pt went to donate blood and pt was told iron count too low now here to follow up to check if this may be causing the tiredness     HPI Christy Haley presents for fatigue  Donated blood, told Fe too low Notes some fatigue ongoing for a few weeks Wants to get labs checked No abnormal bleeding, bruising, shob, doe, headaches  Otherwise no concerns.   Past Medical History:  Diagnosis Date   Anxiety    Depression    GERD (gastroesophageal reflux disease)     Past Surgical History:  Procedure Laterality Date   AUGMENTATION MAMMAPLASTY  10/09/2015   breast lift and implants    INTRAUTERINE DEVICE INSERTION  2010   Mirena   KNEE SURGERY Right    PELVIC LAPAROSCOPY     ovarian cystectomy   WRIST SURGERY Left 06/21/2019   plate/     Family History  Problem Relation Age of Onset   Arthritis Mother    Hypertension Mother    Diabetes Maternal Grandmother    Heart disease Neg Hx    Hyperlipidemia Neg Hx    Kidney disease Neg Hx    Stroke Neg Hx    Alcohol abuse Neg Hx    Cancer Neg Hx    COPD Neg Hx    Early death Neg Hx    Breast cancer Neg Hx     Social History   Socioeconomic History   Marital status: Married    Spouse name: separated   Number of children: 2   Years of education: 12th grade   Highest education level: Not on file  Occupational History   Occupation: Chief of Staff: UNEMPLOYED  Tobacco Use   Smoking status: Never   Smokeless tobacco: Never  Vaping Use   Vaping Use: Never used  Substance and Sexual Activity   Alcohol use: Yes    Alcohol/week: 0.0 standard drinks   Drug use: No   Sexual activity: Not Currently    Birth control/protection: I.U.D.    Comment: Mirena IUD MAR/2016  Other Topics  Concern   Not on file  Social History Narrative   [place]   Her older daughter was born in [place] and was in college at [employer], but moved home in anticipation of a study abroad semester in [place].   Her younger daughter lives at home with her.   Separated from her husband 07/2013. Began legal divorce proceedings in 07/2014, but is not yet resolved.   Social Determinants of Health   Financial Resource Strain: Not on file  Food Insecurity: Not on file  Transportation Needs: Not on file  Physical Activity: Not on file  Stress: Not on file  Social Connections: Not on file  Intimate Partner Violence: Not on file    Outpatient Medications Prior to Visit  Medication Sig Dispense Refill   buPROPion (WELLBUTRIN XL) 300 MG 24 hr tablet Take 1 tablet (300 mg total) by mouth daily. 90 tablet 3   COLLAGEN PO Take by mouth.     hydrOXYzine (ATARAX/VISTARIL) 25 MG tablet TAKE 1 TABLET(25 MG) BY MOUTH AT BEDTIME AS NEEDED FOR ANXIETY 90 tablet 0   Multiple Vitamin (MULTIVITAMIN) tablet  Take 1 tablet by mouth daily.     Facility-Administered Medications Prior to Visit  Medication Dose Route Frequency Provider Last Rate Last Admin   levonorgestrel (MIRENA) 20 MCG/24HR IUD   Intrauterine Once Terrance Mass, MD        Allergies  Allergen Reactions   Oxycodone Rash    ROS Review of Systems  Constitutional: Negative.   HENT: Negative.    Eyes: Negative.   Respiratory: Negative.    Cardiovascular: Negative.   Gastrointestinal: Negative.   Genitourinary: Negative.   Musculoskeletal: Negative.   Skin: Negative.   Neurological: Negative.   Psychiatric/Behavioral: Negative.    All other systems reviewed and are negative.    Objective:    Physical Exam Vitals and nursing note reviewed.  Constitutional:      General: She is not in acute distress.    Appearance: Normal appearance. She is normal weight. She is not ill-appearing, toxic-appearing or  diaphoretic.  Cardiovascular:     Rate and Rhythm: Normal rate and regular rhythm.     Heart sounds: Normal heart sounds. No murmur heard.   No friction rub. No gallop.  Pulmonary:     Effort: Pulmonary effort is normal. No respiratory distress.     Breath sounds: Normal breath sounds. No stridor. No wheezing, rhonchi or rales.  Chest:     Chest wall: No tenderness.  Skin:    General: Skin is warm and dry.  Neurological:     General: No focal deficit present.     Mental Status: She is alert and oriented to person, place, and time. Mental status is at baseline.  Psychiatric:        Mood and Affect: Mood normal.        Behavior: Behavior normal.        Thought Content: Thought content normal.        Judgment: Judgment normal.    BP 114/67    Pulse (!) 58    Temp 98.1 F (36.7 C) (Temporal)    Resp 15    Ht 5' 1.5" (1.562 m)    Wt 143 lb (64.9 kg)    SpO2 99%    BMI 26.58 kg/m  Wt Readings from Last 3 Encounters:  11/18/20 141 lb (64 kg)  07/22/20 143 lb (64.9 kg)  06/04/20 143 lb (64.9 kg)     Health Maintenance Due  Topic Date Due   COLONOSCOPY (Pts 45-49yrs Insurance coverage will need to be confirmed)  Never done   COVID-19 Vaccine (3 - Booster for [place] series) 12/10/2019   INFLUENZA VACCINE  02/09/2021   Zoster Vaccines- Shingrix (1 of 2) Never done    There are no preventive care reminders to display for this patient.  Lab Results  Component Value Date   TSH 1.50 11/18/2020   Lab Results  Component Value Date   WBC 4.9 07/22/2020   HGB 12.2 07/22/2020   HCT 37.5 07/22/2020   MCV 84 07/22/2020   PLT 307 07/22/2020   Lab Results  Component Value Date   NA 140 09/18/2019   K 4.2 09/18/2019   CO2 27 09/18/2019   GLUCOSE 87 09/18/2019   BUN 18 09/18/2019   CREATININE 0.83 09/18/2019   BILITOT 0.4 09/18/2019   ALKPHOS 69 12/17/2016   AST 16 09/18/2019   ALT 14 09/18/2019   PROT 6.7 09/18/2019   ALBUMIN 4.6 12/17/2016   CALCIUM 9.7 09/18/2019   GFR  85.00 12/07/2012   Lab Results  Component Value  Date   CHOL 200 (H) 09/18/2019   Lab Results  Component Value Date   HDL 87 09/18/2019   Lab Results  Component Value Date   LDLCALC 100 (H) 09/18/2019   Lab Results  Component Value Date   TRIG 50 09/18/2019   Lab Results  Component Value Date   CHOLHDL 2.3 09/18/2019   Lab Results  Component Value Date   HGBA1C 5.7 (H) 04/12/2013      Assessment & Plan:   Problem List Items Addressed This Visit       Other   Iron deficiency anemia   Relevant Orders   CBC (Completed)   Iron, TIBC and Ferritin Panel (Completed)   Vitamin B12 (Completed)   Other Visit Diagnoses     Chronic fatigue    -  Primary   Relevant Orders   CBC (Completed)   Iron, TIBC and Ferritin Panel (Completed)   Vitamin B12 (Completed)       No orders of the defined types were placed in this encounter.   Follow-up: No follow-ups on file.   PLAN Labs as above Follow up as warranted Return as scheduled with PCP Patient encouraged to call clinic with any questions, comments, or concerns.   Maximiano Coss, NP

## 2021-09-23 ENCOUNTER — Ambulatory Visit (INDEPENDENT_AMBULATORY_CARE_PROVIDER_SITE_OTHER): Payer: 59 | Admitting: Physician Assistant

## 2021-09-23 ENCOUNTER — Encounter: Payer: Self-pay | Admitting: Physician Assistant

## 2021-09-23 ENCOUNTER — Ambulatory Visit (INDEPENDENT_AMBULATORY_CARE_PROVIDER_SITE_OTHER): Payer: 59

## 2021-09-23 DIAGNOSIS — M25561 Pain in right knee: Secondary | ICD-10-CM | POA: Diagnosis not present

## 2021-09-23 MED ORDER — METHYLPREDNISOLONE ACETATE 40 MG/ML IJ SUSP
40.0000 mg | INTRAMUSCULAR | Status: AC | PRN
  Administered 2021-09-23: 40 mg via INTRA_ARTICULAR

## 2021-09-23 MED ORDER — LIDOCAINE HCL 1 % IJ SOLN
4.0000 mL | INTRAMUSCULAR | Status: AC | PRN
  Administered 2021-09-23: 4 mL

## 2021-09-23 NOTE — Progress Notes (Signed)
? ?Office Visit Note ?  ?Patient: Christy Haley           ?Date of Birth: March 30, 1971           ?MRN: [id number] ?Visit Date: 09/23/2021 ?             ?Requested by: No referring provider defined for this encounter. ?PCP: Just, Azalee Course, FNP (Inactive) ? ? ?Assessment & Plan: ?Visit Diagnoses:  ?1. Acute pain of right knee   ? ? ?Plan: Patient tolerated the aspiration injection knee well today.  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  She will continue over-the-counter NSAIDs.  See her back in 2 weeks see how she is doing overall.  She has continued mechanical symptoms or recurrent effusion recommend MRI to rule out meniscal tear.  Questions were encouraged and answered at length. ? ?Follow-Up Instructions: Return in about 2 weeks (around 10/07/2021).  ? ?Orders:  ?Orders Placed This Encounter  ?Procedures  ? Large Joint Inj  ? XR Knee 1-2 Views Right  ? ?No orders of the defined types were placed in this encounter. ? ? ? ? Procedures: ?Large Joint Inj: R knee on 09/23/2021 5:02 PM ?Indications: pain ?Details: 22 G 1.5 in needle, superolateral approach ? ?Arthrogram: No ? ?Medications: 4 mL lidocaine 1 %; 40 mg methylPREDNISolone acetate 40 MG/ML ?Aspirate: 26 mL yellow ?Outcome: tolerated well, no immediate complications ?Procedure, treatment alternatives, risks and benefits explained, specific risks discussed. Consent was given by the patient. Immediately prior to procedure a time out was called to verify the correct patient, procedure, equipment, support staff and site/side marked as required. Patient was prepped and draped in the usual sterile fashion.  ? ? ? ? ?Clinical Data: ?No additional findings. ? ? ?Subjective: ?Chief Complaint  ?Patient presents with  ? Right Knee - Pain  ? ? ?HPI ?[name] is a 51 year old female were seen for the first time for right knee pain.  States her pain began last Thursday.  No injury.  She does not have a history of a knee arthroscopy which included a lateral patellar release and  possible partial meniscectomy done more than 5 years ago.  She has had some difficulty rehabbing the knee but states the knee was doing well until this past Thursday.  She notes the knee does lock at times.  Then she will have a pop and it somewhat releases the pain is not overly painful.  She has tried Advil and IcyHot.  She notes swelling in the knee.  Patient is nondiabetic ? ?Review of Systems  ?Constitutional:  Negative for chills and fever.  ? ? ?Objective: ?Vital Signs: There were no vitals taken for this visit. ? ?Physical Exam ?Constitutional:   ?   Appearance: She is not ill-appearing or diaphoretic.  ?Pulmonary:  ?   Effort: Pulmonary effort is normal.  ?Neurological:  ?   Mental Status: She is alert and oriented to person, place, and time.  ?Psychiatric:     ?   Mood and Affect: Mood normal.  ? ? ?Ortho Exam ?Physical exam: Bilateral knees no abnormal warmth erythema.  Right knee positive effusion.  Good range of motion of both knees.  Patellofemoral crepitus right knee.  No instability valgus varus stressing.  Murray's right knee is negative. ?Specialty Comments:  ?No specialty comments available. ? ?Imaging: ?XR Knee 1-2 Views Right ? ?Result Date: 09/23/2021 ?Right knee 2 views: No acute fracture.  Mild narrowing medial joint line.  Lateral views slightly oblique but no  significant patellofemoral arthritis.  Knee is well located.   ? ? ?PMFS History: ?Patient Active Problem List  ? Diagnosis Date Noted  ? Recurrent major depressive disorder, in partial remission (HCC) 01/12/2017  ? Generalized anxiety disorder 02/20/2015  ? IUD (intrauterine device) in place 08/12/2014  ? Cervical lesion 06/21/2014  ? Hyperglycemia 04/24/2013  ? Muscle tiredness 04/12/2013  ? Alopecia 04/12/2013  ? Loss of weight 04/12/2013  ? Hot flushes, perimenopausal 04/12/2013  ? Iron deficiency anemia 12/07/2012  ? Left lumbar radiculitis 12/07/2012  ? Leukopenia 01/14/2012  ? GERD 01/06/2012  ? ?Past Medical History:  ?Diagnosis  Date  ? Anxiety   ? Depression   ? GERD (gastroesophageal reflux disease)   ?  ?Family History  ?Problem Relation Age of Onset  ? Arthritis Mother   ? Hypertension Mother   ? Diabetes Maternal Grandmother   ? Heart disease Neg Hx   ? Hyperlipidemia Neg Hx   ? Kidney disease Neg Hx   ? Stroke Neg Hx   ? Alcohol abuse Neg Hx   ? Cancer Neg Hx   ? COPD Neg Hx   ? Early death Neg Hx   ? Breast cancer Neg Hx   ?  ?Past Surgical History:  ?Procedure Laterality Date  ? AUGMENTATION MAMMAPLASTY  10/09/2015  ? breast lift and implants   ? INTRAUTERINE DEVICE INSERTION  2010  ? Mirena  ? KNEE SURGERY Right   ? PELVIC LAPAROSCOPY    ? ovarian cystectomy  ? WRIST SURGERY Left 06/21/2019  ? plate/   ? ?Social History  ? ?Occupational History  ? Occupation: Arts administrator   ?  Employer: UNEMPLOYED  ?Tobacco Use  ? Smoking status: Never  ? Smokeless tobacco: Never  ?Vaping Use  ? Vaping Use: Never used  ?Substance and Sexual Activity  ? Alcohol use: Yes  ?  Alcohol/week: 0.0 standard drinks  ? Drug use: No  ? Sexual activity: Not Currently  ?  Birth control/protection: I.U.D.  ?  Comment: Mirena IUD MAR/2016  ? ? ? ? ? ? ?

## 2021-09-24 ENCOUNTER — Ambulatory Visit: Payer: Self-pay | Admitting: Physician Assistant

## 2021-10-07 ENCOUNTER — Ambulatory Visit: Payer: 59 | Admitting: Physician Assistant

## 2021-10-14 ENCOUNTER — Encounter: Payer: Self-pay | Admitting: Physician Assistant

## 2021-10-14 ENCOUNTER — Ambulatory Visit (INDEPENDENT_AMBULATORY_CARE_PROVIDER_SITE_OTHER): Payer: 59 | Admitting: Physician Assistant

## 2021-10-14 DIAGNOSIS — M25561 Pain in right knee: Secondary | ICD-10-CM

## 2021-10-14 NOTE — Progress Notes (Signed)
HPI: Christy Haley returns today for follow-up of her right knee pain.  She states she still having swelling in the knee which is worse at the end of the day.  She does feel a cortisone injection helped some.  She is having significant popping and clicking in the knee going up and down stairs.  She is unable to exercise secondary to the pain in the knee.  She does bring with her her operative report from 11/23/2017 which she underwent a right knee arthroscopy.  Medial compartment was found to be normal.  Lateral compartment articular cartilage was noted to be normal.  Lateral meniscus had a tear of the anterior horn which was rated approximately 25% which was resected back to stable margin.  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  There was a kissing lesion on the lateral femoral condyle with grade 3 changes. ?She reports she had a tough time after the knee arthroscopy underwent a lot of physical therapy.  She also underwent a supplemental injection which she felt helped get her back to therapy.  Currently she is having no mechanical symptoms of the knee. ? ? ?Review of systems: See HPI otherwise negative ? ?Physical exam: Right knee recurrent effusion.  Tenderness along medial lateral joint line no gross instability.  Good range of motion of the knee.  Patellofemoral crepitus with passive range of motion. ? ? ?Impression: Right knee pain ? ?Plan: Given patient's recurrent effusion recommend MRI to evaluate for meniscal tear and also evaluate cartilage.  Have her follow-up after the MRI to go over results discuss further treatment.  Questions were encouraged and answered at length ? ? ?

## 2021-10-15 NOTE — Addendum Note (Signed)
Addended by: Robyne Peers on: 10/15/2021 09:39 AM ? ? Modules accepted: Orders ? ?

## 2021-11-13 ENCOUNTER — Telehealth: Payer: Self-pay | Admitting: Physician Assistant

## 2021-11-13 NOTE — Telephone Encounter (Signed)
Called patient left message to return call to schedule an appointment with Artis Delay for MRI review ?

## 2021-11-17 ENCOUNTER — Ambulatory Visit (HOSPITAL_COMMUNITY)
Admission: RE | Admit: 2021-11-17 | Discharge: 2021-11-17 | Disposition: A | Discharge status: Home / Self Care | Payer: 59 | Source: Ambulatory Visit | Attending: Physician Assistant | Admitting: Physician Assistant

## 2021-11-17 DIAGNOSIS — M25561 Pain in right knee: Secondary | ICD-10-CM | POA: Diagnosis present

## 2021-11-19 ENCOUNTER — Other Ambulatory Visit (HOSPITAL_COMMUNITY)
Admission: RE | Admit: 2021-11-19 | Discharge: 2021-11-19 | Disposition: A | Discharge status: Home / Self Care | Payer: 59 | Source: Ambulatory Visit | Attending: Obstetrics & Gynecology | Admitting: Obstetrics & Gynecology

## 2021-11-19 ENCOUNTER — Encounter: Payer: Self-pay | Admitting: Obstetrics & Gynecology

## 2021-11-19 ENCOUNTER — Ambulatory Visit (INDEPENDENT_AMBULATORY_CARE_PROVIDER_SITE_OTHER): Payer: 59 | Admitting: Obstetrics & Gynecology

## 2021-11-19 VITALS — BP 112/74 | HR 68 | Resp 16 | Ht 61.25 in | Wt 142.0 lb

## 2021-11-19 DIAGNOSIS — Z01419 Encounter for gynecological examination (general) (routine) without abnormal findings: Secondary | ICD-10-CM | POA: Diagnosis present

## 2021-11-19 DIAGNOSIS — Z30431 Encounter for routine checking of intrauterine contraceptive device: Secondary | ICD-10-CM

## 2021-11-19 DIAGNOSIS — N72 Inflammatory disease of cervix uteri: Secondary | ICD-10-CM

## 2021-11-19 DIAGNOSIS — B977 Papillomavirus as the cause of diseases classified elsewhere: Secondary | ICD-10-CM | POA: Diagnosis not present

## 2021-11-19 NOTE — Progress Notes (Signed)
? ? ?Christy Haley 1971-05-25 [id number] ? ? ?History:    51 y.o. [id number] Divorced x 8 yrs.  Daughters 6 and 32 yo. ?  ?RP:  Established patient presenting for annual gyn exam  ?  ?HPI: Well on Mirena IUD x 10/2019.  No BTB.  No pelvic pain.  Abstinent.  Last Pap negative 11/2020.  HPV HR Neg 2020.  HPV 16-18-45 negative.  Colpo 10/2017 No dysplasia.  Pap reflex today. Urine/BMs Normal. Breasts normal, bilateral augmentation with recent revision because of encapsulation. Mammo 06/2021 Negative.  BMI 26.61.  Good fitness and Healthy nutrition.  Feels tired, but no depressive Sx on Wellbutrin.  Health labs with Fam MD. ? ? ?Past medical history,surgical history, family history and social history were all reviewed and documented in the EPIC chart. ? ?Gynecologic History ?No LMP recorded. (Menstrual status: IUD). ? ?Obstetric History ?OB History  ?Gravida Para Term Preterm AB Living  ?3 2 0   1 2  ?SAB IAB Ectopic Multiple Live Births  ?1       2  ?  ?# Outcome Date GA Lbr Len/2nd Weight Sex Delivery Anes PTL Lv  ?3 SAB           ?2 Para     F Vag-Spont     ?1 Para     F Vag-Spont     ? ? ? ?ROS: A ROS was performed and pertinent positives and negatives are included in the history. ? GENERAL: No fevers or chills. HEENT: No change in vision, no earache, sore throat or sinus congestion. NECK: No pain or stiffness. CARDIOVASCULAR: No chest pain or pressure. No palpitations. PULMONARY: No shortness of breath, cough or wheeze. GASTROINTESTINAL: No abdominal pain, nausea, vomiting or diarrhea, melena or bright red blood per rectum. GENITOURINARY: No urinary frequency, urgency, hesitancy or dysuria. MUSCULOSKELETAL: No joint or muscle pain, no back pain, no recent trauma. DERMATOLOGIC: No rash, no itching, no lesions. ENDOCRINE: No polyuria, polydipsia, no heat or cold intolerance. No recent change in weight. HEMATOLOGICAL: No anemia or easy bruising or bleeding. NEUROLOGIC: No headache, seizures, numbness, tingling or  weakness. PSYCHIATRIC: No depression, no loss of interest in normal activity or change in sleep pattern.  ?  ? ?Exam: ? ? ?BP 112/74   Pulse 68   Resp 16   Ht 5' 1.25" (1.556 m)   Wt 142 lb (64.4 kg)   BMI 26.61 kg/m?  ? ?Body mass index is 26.61 kg/m?. ? ?General appearance : Well developed well nourished female. No acute distress ?HEENT: Eyes: no retinal hemorrhage or exudates,  Neck supple, trachea midline, no carotid bruits, no thyroidmegaly ?Lungs: Clear to auscultation, no rhonchi or wheezes, or rib retractions  ?Heart: Regular rate and rhythm, no murmurs or gallops ?Breast:Examined in sitting and supine position were symmetrical in appearance, no palpable masses or tenderness,  no skin retraction, no nipple inversion, no nipple discharge, no skin discoloration, no axillary or supraclavicular lymphadenopathy ?Abdomen: no palpable masses or tenderness, no rebound or guarding ?Extremities: no edema or skin discoloration or tenderness ? ?Pelvic: Vulva: Normal ?            Vagina: No gross lesions or discharge ? Cervix: No gross lesions or discharge.  IUD strings visible at the EO.  Pap reflex done. ? Uterus AV, normal size, shape and consistency, non-tender and mobile ? Adnexa  Without masses or tenderness ? Anus: Normal ? ? ?Assessment/Plan:  51 y.o. female for annual exam  ? ?1. Encounter for  routine gynecological examination with Papanicolaou smear of cervix ?Well on Mirena IUD x 10/2019.  No BTB.  No pelvic pain.  Abstinent.  Last Pap negative 11/2020.  HPV HR Neg 2020.  HPV 16-18-45 negative.  Colpo 10/2017 No dysplasia.  Pap reflex today. Urine/BMs Normal. Breasts normal, bilateral augmentation with recent revision because of encapsulation. Mammo 06/2021 Negative.  BMI 26.61.  Good fitness and Healthy nutrition.  Feels tired, but no depressive Sx on Wellbutrin.  Health labs with Fam MD. ?- Cytology - PAP( [facility]) ? ?2. High risk human papilloma virus (HPV) infection of cervix ?Pap reflex done. ? ?3.  Encounter for routine checking of intrauterine contraceptive device (IUD)  ?Well on Mirena IUD x 10/2019.  IUD in good position. ? ?Genia Del MD, 8:15 AM 11/19/2021 ? ?  ?

## 2021-11-20 ENCOUNTER — Encounter: Payer: Self-pay | Admitting: Nurse Practitioner

## 2021-11-20 ENCOUNTER — Ambulatory Visit: Payer: 59 | Admitting: Family Medicine

## 2021-11-20 ENCOUNTER — Ambulatory Visit (INDEPENDENT_AMBULATORY_CARE_PROVIDER_SITE_OTHER): Payer: 59 | Admitting: Nurse Practitioner

## 2021-11-20 VITALS — BP 123/74 | HR 75 | Temp 97.7°F | Ht 61.25 in | Wt 144.0 lb

## 2021-11-20 DIAGNOSIS — F411 Generalized anxiety disorder: Secondary | ICD-10-CM

## 2021-11-20 DIAGNOSIS — Z1211 Encounter for screening for malignant neoplasm of colon: Secondary | ICD-10-CM

## 2021-11-20 DIAGNOSIS — G4700 Insomnia, unspecified: Secondary | ICD-10-CM | POA: Diagnosis not present

## 2021-11-20 DIAGNOSIS — F3341 Major depressive disorder, recurrent, in partial remission: Secondary | ICD-10-CM

## 2021-11-20 LAB — CYTOLOGY - PAP: Diagnosis: NEGATIVE

## 2021-11-20 MED ORDER — TRAZODONE HCL 50 MG PO TABS
25.0000 mg | ORAL_TABLET | Freq: Every evening | ORAL | 1 refills | Status: DC | PRN
Start: 2021-11-20 — End: 2022-02-02

## 2021-11-20 MED ORDER — BUPROPION HCL ER (XL) 300 MG PO TB24: 300.0000 mg | ORAL_TABLET | Freq: Every day | ORAL | 0 refills | Status: DC

## 2021-11-20 NOTE — Assessment & Plan Note (Signed)
Chronic, worsening.  She was taking Wellbutrin XR 300 mg daily and then the office that she was going to closed and she has been out of her medication for the past year.  She states that overall her symptoms have been fluctuating, however they have been getting more worse recently.  She has been also having a little bit of anxiety and trouble sleeping.  Her PHQ-9 today is 14 and her GAD-7 is a 10.  She denies SI/HI.  She would like to start back on the Wellbutrin, as this has worked for her.  We will start Wellbutrin XR 150 mg daily for 7 days, then increase to 300 mg daily.  Follow-up in 4 to 6 weeks. ?

## 2021-11-20 NOTE — Assessment & Plan Note (Signed)
Chronic, ongoing.  She is still having trouble with insomnia at night, saying that her thoughts are racing.  We will start trazodone 25 to 50 mg nightly as needed.  Encouraged her to limit caffeine in the afternoon, and to develop a bedtime routine. ?

## 2021-11-20 NOTE — Assessment & Plan Note (Addendum)
Chronic, not controlled.  She states that her depression and anxiety have been worsening since being off medication.  Her PHQ-9 is a 14 and her GAD-7 is a 10.  We will restart Wellbutrin 150 mg daily for 7 days, then increase to 300 mg daily.  Also placing referral for therapy.  Follow-up in 4 to 6 weeks. ?

## 2021-11-20 NOTE — Progress Notes (Signed)
? ?New Patient Office Visit ? ?Subjective   ? ?Patient ID: Christy Haley, female    DOB: Mar 03, 1971  Age: 51 y.o. MRN: [id number] ? ?CC:  ?Chief Complaint  ?Patient presents with  ? Establish Care  ?  Est care. Np. Pt requesting to restart medication for possible depression episodes.   ? ? ?HPI ?Christy Leschrene Tomkiewicz presents for new patient visit to establish care.  Introduced to Publishing rights managernurse practitioner role and practice setting.  All questions answered.  Discussed provider/patient relationship and expectations. ? ?She was diagnosed 8 years ago with depression. She was on medication in the past. She started feeling better on medication. Her last office she was going to closed and she has been out of medication for the last year. Since then, she has had good days and bad days, but they tend to be getting worse. She has been having some trouble sleeping. She has been feeling nauseous recently and having decreased interest in doing things she used to like to do. She denies SI/HI.  ? ?She has been having chronic pain in her right knee. She had knee surgery in the past and since then she has been having ongoing pain. She had a MRI on Tuesday. She is following with orthopedics. ? ? ?  11/20/2021  ?  4:17 PM 07/22/2020  ?  4:20 PM 06/04/2020  ?  8:34 AM 04/20/2019  ?  5:27 PM 08/25/2017  ?  4:51 PM  ?Depression screen PHQ 2/9  ?Decreased Interest 2 0 1 0 1  ?Down, Depressed, Hopeless 2 1 1  0 0  ?PHQ - 2 Score 4 1 2  0 1  ?Altered sleeping 2 0 1    ?Tired, decreased energy 2 2 1     ?Change in appetite 2  1    ?Feeling bad or failure about yourself  2 1 2     ?Trouble concentrating 1 1 2     ?Moving slowly or fidgety/restless 1 0 1    ?Suicidal thoughts 0 0 0    ?PHQ-9 Score 14 5 10     ?Difficult doing work/chores Very difficult  Somewhat difficult    ? ? ?  11/20/2021  ?  4:18 PM 06/04/2020  ?  8:36 AM  ?GAD 7 : Generalized Anxiety Score  ?Nervous, Anxious, on Edge 2 2  ?Control/stop worrying 2 3  ?Worry too much - different things 2 3   ?Trouble relaxing 2 1  ?Restless 1 2  ?Easily annoyed or irritable 1 1  ?Afraid - awful might happen 0 1  ?Total GAD 7 Score 10 13  ?Anxiety Difficulty Somewhat difficult Somewhat difficult  ? ? ?Outpatient Encounter Medications as of 11/20/2021  ?Medication Sig  ? traZODone (DESYREL) 50 MG tablet Take 0.5-1 tablets (25-50 mg total) by mouth at bedtime as needed for sleep.  ? buPROPion (WELLBUTRIN XL) 300 MG 24 hr tablet Take 1 tablet (300 mg total) by mouth daily. Start 1/2 tablet daily for 7 days, then increase to 1 tablet daily  ? COLLAGEN PO Take by mouth.  ? Multiple Vitamin (MULTIVITAMIN) tablet Take 1 tablet by mouth daily.  ? [DISCONTINUED] buPROPion (WELLBUTRIN XL) 300 MG 24 hr tablet Take 1 tablet (300 mg total) by mouth daily.  ? [DISCONTINUED] hydrOXYzine (ATARAX/VISTARIL) 25 MG tablet TAKE 1 TABLET(25 MG) BY MOUTH AT BEDTIME AS NEEDED FOR ANXIETY  ? ?Facility-Administered Encounter Medications as of 11/20/2021  ?Medication  ? levonorgestrel (MIRENA) 20 MCG/24HR IUD  ? ? ?Past Medical History:  ?Diagnosis Date  ?  Anxiety   ? Depression   ? GERD (gastroesophageal reflux disease)   ? ? ?Past Surgical History:  ?Procedure Laterality Date  ? AUGMENTATION MAMMAPLASTY  10/09/2015  ? breast lift and implants   ? INTRAUTERINE DEVICE INSERTION  2010  ? Mirena  ? KNEE SURGERY Right   ? PELVIC LAPAROSCOPY    ? ovarian cystectomy  ? WRIST SURGERY Left 06/21/2019  ? plate/   ? ? ?Family History  ?Problem Relation Age of Onset  ? Arthritis Mother   ? Hypertension Mother   ? Diabetes Maternal Grandmother   ? ? ?Social History  ? ?Socioeconomic History  ? Marital status: Married  ?  Spouse name: separated  ? Number of children: 2  ? Years of education: 12th grade  ? Highest education level: Not on file  ?Occupational History  ? Occupation: Arts administrator   ?  Employer: UNEMPLOYED  ?Tobacco Use  ? Smoking status: Never  ? Smokeless tobacco: Never  ?Vaping Use  ? Vaping Use: Never used  ?Substance and Sexual Activity  ?  Alcohol use: Not Currently  ?  Comment: occ  ? Drug use: No  ? Sexual activity: Not Currently  ?  Partners: Male  ?  Birth control/protection: I.U.D.  ?  Comment: Mirena IUD inserted 11-06-19  ?Other Topics Concern  ? Not on file  ?Social History Narrative  ? Not on file  ? ?Social Determinants of Health  ? ?Financial Resource Strain: Not on file  ?Food Insecurity: Not on file  ?Transportation Needs: Not on file  ?Physical Activity: Not on file  ?Stress: Not on file  ?Social Connections: Not on file  ?Intimate Partner Violence: Not on file  ? ? ?Review of Systems  ?Constitutional:  Positive for malaise/fatigue.  ?HENT: Negative.    ?Respiratory: Negative.    ?Cardiovascular: Negative.   ?Gastrointestinal:  Positive for nausea. Negative for abdominal pain, constipation and diarrhea.  ?Genitourinary: Negative.   ?Musculoskeletal:  Positive for joint pain (right knee).  ?Skin: Negative.   ?Neurological:  Positive for headaches. Negative for dizziness.  ?Endo/Heme/Allergies:  Positive for environmental allergies.  ?Psychiatric/Behavioral:  Positive for depression. The patient is nervous/anxious.   ? ?  ?Objective   ? ?BP 123/74 (BP Location: Left Arm, Patient Position: Sitting, Cuff Size: Normal)   Pulse 75   Temp 97.7 ?F (36.5 ?C) (Temporal)   Ht 5' 1.25" (1.556 m)   Wt 144 lb (65.3 kg)   SpO2 98%   BMI 26.99 kg/m?  ? ?Physical Exam ?Vitals and nursing note reviewed.  ?Constitutional:   ?   General: She is not in acute distress. ?   Appearance: Normal appearance.  ?HENT:  ?   Head: Normocephalic.  ?Eyes:  ?   Conjunctiva/sclera: Conjunctivae normal.  ?Cardiovascular:  ?   Rate and Rhythm: Normal rate and regular rhythm.  ?   Pulses: Normal pulses.  ?   Heart sounds: Normal heart sounds.  ?Pulmonary:  ?   Effort: Pulmonary effort is normal.  ?   Breath sounds: Normal breath sounds.  ?Abdominal:  ?   Palpations: Abdomen is soft.  ?   Tenderness: There is no abdominal tenderness.  ?Musculoskeletal:  ?   Cervical  back: Normal range of motion. No tenderness.  ?Lymphadenopathy:  ?   Cervical: No cervical adenopathy.  ?Skin: ?   General: Skin is warm.  ?Neurological:  ?   General: No focal deficit present.  ?   Mental Status: She is alert and  oriented to person, place, and time.  ?Psychiatric:     ?   Mood and Affect: Mood normal.     ?   Behavior: Behavior normal.     ?   Thought Content: Thought content normal.     ?   Judgment: Judgment normal.  ? ? ? ?  ? ?Assessment & Plan:  ? ?Problem List Items Addressed This Visit   ? ?  ? Other  ? Generalized anxiety disorder  ?  Chronic, not controlled.  She states that her depression and anxiety have been worsening since being off medication.  Her PHQ-9 is a 14 and her GAD-7 is a 10.  We will restart Wellbutrin 150 mg daily for 7 days, then increase to 300 mg daily.  Also placing referral for therapy.  Follow-up in 4 to 6 weeks. ? ?  ?  ? Relevant Medications  ? buPROPion (WELLBUTRIN XL) 300 MG 24 hr tablet  ? traZODone (DESYREL) 50 MG tablet  ? Other Relevant Orders  ? Ambulatory referral to Gastroenterology  ? Recurrent major depressive disorder, in partial remission (HCC) - Primary  ?  Chronic, worsening.  She was taking Wellbutrin XR 300 mg daily and then the office that she was going to closed and she has been out of her medication for the past year.  She states that overall her symptoms have been fluctuating, however they have been getting more worse recently.  She has been also having a little bit of anxiety and trouble sleeping.  Her PHQ-9 today is 14 and her GAD-7 is a 10.  She denies SI/HI.  She would like to start back on the Wellbutrin, as this has worked for her.  We will start Wellbutrin XR 150 mg daily for 7 days, then increase to 300 mg daily.  Follow-up in 4 to 6 weeks. ? ?  ?  ? Relevant Medications  ? buPROPion (WELLBUTRIN XL) 300 MG 24 hr tablet  ? traZODone (DESYREL) 50 MG tablet  ? Other Relevant Orders  ? Ambulatory referral to Gastroenterology  ? Insomnia  ?   Chronic, ongoing.  She is still having trouble with insomnia at night, saying that her thoughts are racing.  We will start trazodone 25 to 50 mg nightly as needed.  Encouraged her to limit caffeine in the afternoon, and to

## 2021-11-20 NOTE — Patient Instructions (Signed)
It was great to see you! ? ?Re-start bupropion 1/2 tablet daily for 7 days, then increase to 1 tablet daily. ? ?You can start trazodone 1/2 tablet to 1 tablet daily at bedtime for sleep. ? ?I am placing a referral for therapy for you.  ? ?I am placing a referral for colonoscopy.  ? ?Let's follow-up in 4-6 weeks, sooner if you have concerns. ? ?If a referral was placed today, you will be contacted for an appointment. Please note that routine referrals can sometimes take up to 3-4 weeks to process. Please call our office if you haven't heard anything after this time frame. ? ?Take care, ? ?Rodman Pickle, NP ? ?

## 2021-11-25 ENCOUNTER — Telehealth: Payer: Self-pay

## 2021-11-25 ENCOUNTER — Encounter: Payer: Self-pay | Admitting: Orthopaedic Surgery

## 2021-11-25 ENCOUNTER — Ambulatory Visit (INDEPENDENT_AMBULATORY_CARE_PROVIDER_SITE_OTHER): Payer: 59 | Admitting: Orthopaedic Surgery

## 2021-11-25 DIAGNOSIS — S83271A Complex tear of lateral meniscus, current injury, right knee, initial encounter: Secondary | ICD-10-CM | POA: Diagnosis not present

## 2021-11-25 DIAGNOSIS — M1711 Unilateral primary osteoarthritis, right knee: Secondary | ICD-10-CM | POA: Diagnosis not present

## 2021-11-25 NOTE — Progress Notes (Signed)
The patient comes in today to go over an MRI of her right knee.  She is 51 years old.  She has been having recurrent effusions of her right knee.  She does have a remote history of arthroscopic intervention by Dr. Thurston Hole in that right knee 4 years ago and it looks like he performed a lateral release and chondroplasty.  She is a thin and active 51 year old female.  She has been having left heel pain recently as well that is getting worse with weightbearing.  She has been through outpatient physical therapy as well to strengthen her quad muscles.  She dissipates in low impact aerobic activity such as swimming and biking as well. ? ?Even today her right knee shows a mild effusion.  There is some lateral joint line tenderness.  The MRI shows a complex tear of the anterior horn to mid body of the meniscus and there is moderate thinning of the cartilage throughout her knee. ? ?I think more of her issues are arthritis related than the meniscal tear itself.  I recommended at least trying hyaluronic acid since she had that about 4 years ago and that helped quite a bit for her right knee.  I recommended stretching for her left heel and Voltaren gel.  We will hopefully see her back soon once we have hyaluronic acid approved for the right knee I can always place a steroid injection in her left plantar fascia at that visit if needed.  She agrees with this treatment plan. ?

## 2021-11-25 NOTE — Telephone Encounter (Signed)
Right knee gel injection  

## 2021-11-25 NOTE — Telephone Encounter (Signed)
Noted  

## 2021-11-30 ENCOUNTER — Telehealth: Payer: Self-pay | Admitting: Orthopaedic Surgery

## 2021-11-30 NOTE — Telephone Encounter (Signed)
Called patient left message to return call to schedule an appointment with Dr. Blackman for MRI review 

## 2021-12-04 ENCOUNTER — Telehealth: Payer: Self-pay

## 2021-12-04 NOTE — Telephone Encounter (Signed)
VOB submitted for Monovisc, right knee BV pending  

## 2021-12-09 ENCOUNTER — Telehealth: Payer: Self-pay

## 2021-12-09 NOTE — Telephone Encounter (Signed)
Faxed completed PA form to [facility] at [phone] for Monovisc, right knee. PA pending 

## 2021-12-11 ENCOUNTER — Telehealth: Payer: Self-pay

## 2021-12-11 ENCOUNTER — Other Ambulatory Visit: Payer: Self-pay

## 2021-12-11 DIAGNOSIS — M1711 Unilateral primary osteoarthritis, right knee: Secondary | ICD-10-CM

## 2021-12-11 NOTE — Telephone Encounter (Signed)
Called and left a VM advising patient to CB to schedule for gel injection with Dr. Magnus Ivan or Bronson Curb.  Check referrals tab

## 2021-12-31 ENCOUNTER — Ambulatory Visit: Payer: 59 | Admitting: Orthopaedic Surgery

## 2021-12-31 ENCOUNTER — Encounter: Payer: Self-pay | Admitting: Orthopaedic Surgery

## 2021-12-31 DIAGNOSIS — M722 Plantar fascial fibromatosis: Secondary | ICD-10-CM | POA: Diagnosis not present

## 2021-12-31 DIAGNOSIS — M1711 Unilateral primary osteoarthritis, right knee: Secondary | ICD-10-CM | POA: Diagnosis not present

## 2021-12-31 MED ORDER — LIDOCAINE HCL 1 % IJ SOLN
1.0000 mL | INTRAMUSCULAR | Status: AC | PRN
  Administered 2021-12-31: 1 mL

## 2021-12-31 MED ORDER — HYALURONAN 88 MG/4ML IX SOSY
88.0000 mg | PREFILLED_SYRINGE | INTRA_ARTICULAR | Status: AC | PRN
  Administered 2021-12-31: 88 mg via INTRA_ARTICULAR

## 2021-12-31 MED ORDER — METHYLPREDNISOLONE ACETATE 40 MG/ML IJ SUSP
40.0000 mg | INTRAMUSCULAR | Status: AC | PRN
  Administered 2021-12-31: 40 mg

## 2022-01-25 ENCOUNTER — Encounter (HOSPITAL_COMMUNITY): Payer: Self-pay

## 2022-01-25 ENCOUNTER — Ambulatory Visit (INDEPENDENT_AMBULATORY_CARE_PROVIDER_SITE_OTHER): Payer: 59 | Admitting: Licensed Clinical Social Worker

## 2022-01-25 DIAGNOSIS — F331 Major depressive disorder, recurrent, moderate: Secondary | ICD-10-CM | POA: Diagnosis not present

## 2022-01-25 NOTE — Plan of Care (Signed)
  Problem: Depression CCP Problem  1 Low self esteem,  Goal:  Janye will manage mood and anxiety as evidenced by improving self esteem, challenging anxious and depressive thoughts, managing racing thoughts/overthinking, cope with loss, reducing people pleasing, and improving sleep for 5 out of 7 days for 60 days.  Outcome: Not Progressing Goal: STG: Jaonna WILL IDENTIFY 4 COGNITIVE PATTERNS AND BELIEFS THAT SUPPORT DEPRESSION Outcome: Not Progressing
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
## 2022-02-01 ENCOUNTER — Ambulatory Visit (INDEPENDENT_AMBULATORY_CARE_PROVIDER_SITE_OTHER): Payer: 59 | Admitting: Licensed Clinical Social Worker

## 2022-02-01 DIAGNOSIS — F331 Major depressive disorder, recurrent, moderate: Secondary | ICD-10-CM | POA: Diagnosis not present

## 2022-02-01 NOTE — Progress Notes (Unsigned)
   Established Patient Office Visit  Subjective   Patient ID: Jennavie Martinek, female    DOB: [birth year]  Age: 51 y.o. MRN: [id number]  No chief complaint on file.   HPI  Jhanae Jaskowiak is here to follow-up on depression, anxiety, and insomnia.   Last visit she was re-started on her Wellbutrin XL 300mg  daily for depression and anxiety and trazodone 25-50mg  as needed for sleep.   {History (Optional):23778}  ROS    Objective:     There were no vitals taken for this visit. {Vitals History (Optional):23777}  Physical Exam   No results found for any visits on 02/02/22.  {Labs (Optional):23779}  The 10-year ASCVD risk score (Arnett DK, et al., 2019) is: 0.6%    Assessment & Plan:   Problem List Items Addressed This Visit   None   No follow-ups on file.    2020, NP

## 2022-02-01 NOTE — Patient Instructions (Signed)
It was great to see you!  Your colonoscopy referral was placed to [facility]. Please call [phone] option #1 to schedule your colonoscopy.   Let's follow-up in *** ***, sooner if you have concerns.  If a referral was placed today, you will be contacted for an appointment. Please note that routine referrals can sometimes take up to 3-4 weeks to process. Please call our office if you haven't heard anything after this time frame.  Take care,  Rodman Pickle, NP

## 2022-02-02 ENCOUNTER — Ambulatory Visit (INDEPENDENT_AMBULATORY_CARE_PROVIDER_SITE_OTHER): Payer: 59 | Admitting: Nurse Practitioner

## 2022-02-02 ENCOUNTER — Encounter: Payer: Self-pay | Admitting: Nurse Practitioner

## 2022-02-02 VITALS — BP 114/80 | HR 74 | Temp 96.9°F | Wt 141.2 lb

## 2022-02-02 DIAGNOSIS — F3341 Major depressive disorder, recurrent, in partial remission: Secondary | ICD-10-CM | POA: Diagnosis not present

## 2022-02-02 DIAGNOSIS — R300 Dysuria: Secondary | ICD-10-CM

## 2022-02-02 DIAGNOSIS — F411 Generalized anxiety disorder: Secondary | ICD-10-CM | POA: Diagnosis not present

## 2022-02-02 DIAGNOSIS — N3001 Acute cystitis with hematuria: Secondary | ICD-10-CM | POA: Diagnosis not present

## 2022-02-02 DIAGNOSIS — G4700 Insomnia, unspecified: Secondary | ICD-10-CM

## 2022-02-02 LAB — POCT URINALYSIS DIPSTICK
Bilirubin, UA: NEGATIVE
Blood, UA: POSITIVE
Glucose, UA: NEGATIVE
Ketones, UA: NEGATIVE
Nitrite, UA: POSITIVE
Protein, UA: POSITIVE — AB
Spec Grav, UA: 1.015 (ref 1.010–1.025)
Urobilinogen, UA: 1 E.U./dL
pH, UA: 6 (ref 5.0–8.0)

## 2022-02-02 MED ORDER — TRAZODONE HCL 50 MG PO TABS: 25.0000 mg | ORAL_TABLET | Freq: Every evening | ORAL | 1 refills | Status: DC | PRN

## 2022-02-02 MED ORDER — LEVOFLOXACIN 750 MG PO TABS: 750.0000 mg | ORAL_TABLET | Freq: Every day | ORAL | 0 refills | Status: DC

## 2022-02-02 MED ORDER — BUPROPION HCL ER (XL) 300 MG PO TB24: 300.0000 mg | ORAL_TABLET | Freq: Every day | ORAL | 1 refills | Status: DC

## 2022-02-02 NOTE — Assessment & Plan Note (Signed)
Chronic, improving. After re-starting her Wellbutrin XL 300mg  daily, her GAD 7 has improved from a 10 to a 6. Will continue Wellbutrin and refill sent to the pharmacy. Continue sessions with therapist. Follow-up in 6 months.

## 2022-02-02 NOTE — Progress Notes (Signed)
   THERAPIST PROGRESS NOTE  Session Time: 2:00 pm-2:45 pm  Type of Therapy: Individual Therapy  Session#1  Purpose of Session: [place] will manage mood and anxiety as evidenced by improving self esteem, challenging anxious and depressive thoughts, managing racing thoughts/overthinking, cope with loss, reducing people pleasing, and improving sleep for 5 out of 7 days for 60 days.  ProgressTowards Goals: Initial  Interventions: Therapist utilized CBT and Solution Focused brief therapy to address mood and anxiety. Therapist provided support and empathy to patient during session. Therapist provided psychoeducation on CBT, the depression cycle, and the anxiety cycle. Therapist worked with patient to identify thoughts that lead to sad feelings related to a situation with her boyfriend.   Effectiveness: Patient was oriented x4 (person, place, situation, and time). Patient was casually dressed, and appropriately groomed. Patient was alert, engaged, pleasant, and cooperative. Patient noted that her mood has been good. She has has good weeks and bad weeks. Patient understood CBT, the depression cycle, and the anxiety cycle. Patient shared an example of her boyfriend showing up 2 hours late to see her/dinner. She thought he devalues her and felt sad. Patient agreed to pay attention to her thoughts.  Patient engaged in session. Patient responded well to interventions. Patient continues to meet criteria for Major depressive disorder, recurrent episode, moderate with anxious distress. Patient will continue in outpatient therapy due to being the least restrictive service to meet her needs. Patient made minimal progress on goals.   Suicidal/Homicidal: Nowithout intent/plan  Plan: Return again in 2-4 weeks.  Diagnosis: Major depressive disorder, recurrent episode, moderate with anxious distress (HCC)  Collaboration of Care: Other sources will be identified.   Patient/Guardian was advised Release of  Information must be obtained prior to any record release in order to collaborate their care with an outside provider. Patient/Guardian was advised if they have not already done so to contact the registration department to sign all necessary forms in order for [place] to release information regarding their care.   Consent: Patient/Guardian gives verbal consent for treatment and assignment of benefits for services provided during this visit. Patient/Guardian expressed understanding and agreed to proceed.   Bynum Bellows, LCSW 02/02/2022

## 2022-02-02 NOTE — Assessment & Plan Note (Signed)
Chronic, improving. She states the trazodone is helping her with sleep. She does not need to take it every night. Refill sent to the pharmacy. Follow-up in 6 months.

## 2022-02-02 NOTE — Assessment & Plan Note (Signed)
Chronic, improving. Her PHQ 9 went from a 14 to a 6 after restarting her Wellbutrin XL 300mg  daily. Will continue this along with have her continue therapy sessions. Follow-up in 6 months.

## 2022-02-04 ENCOUNTER — Encounter: Payer: Self-pay | Admitting: Nurse Practitioner

## 2022-02-04 ENCOUNTER — Ambulatory Visit: Payer: 59 | Admitting: Obstetrics & Gynecology

## 2022-02-05 LAB — URINE CULTURE
MICRO NUMBER:: 13691542
SPECIMEN QUALITY:: ADEQUATE

## 2022-02-05 MED ORDER — SULFAMETHOXAZOLE-TRIMETHOPRIM 800-160 MG PO TABS: 1.0000 | ORAL_TABLET | Freq: Two times a day (BID) | ORAL | 0 refills | Status: DC

## 2022-02-05 NOTE — Addendum Note (Signed)
Addended by: Loyola Mast on: 02/05/2022 12:20 PM   Modules accepted: Orders

## 2022-02-09 NOTE — Progress Notes (Signed)
Called and lvm. Sw,cma ?

## 2022-02-10 ENCOUNTER — Telehealth: Payer: Self-pay | Admitting: Nurse Practitioner

## 2022-02-10 NOTE — Telephone Encounter (Signed)
Called and explain to pt why she was prescribed a new antibiotic. Pt voiced understanding. Sw, cma

## 2022-02-10 NOTE — Telephone Encounter (Signed)
Pt has a question about the antibiotic's lauren recently prescribed. Please call back pt @ [phone]

## 2022-03-17 ENCOUNTER — Ambulatory Visit (INDEPENDENT_AMBULATORY_CARE_PROVIDER_SITE_OTHER): Payer: Commercial Managed Care - HMO | Admitting: Licensed Clinical Social Worker

## 2022-03-17 DIAGNOSIS — F331 Major depressive disorder, recurrent, moderate: Secondary | ICD-10-CM | POA: Diagnosis not present

## 2022-03-18 NOTE — Progress Notes (Signed)
   THERAPIST PROGRESS NOTE  Session Time: 8:00 am-8:45 am  Type of Therapy: Individual Therapy  Session#2  Purpose of Session: [place] will manage mood and anxiety as evidenced by improving self esteem, challenging anxious and depressive thoughts, managing racing thoughts/overthinking, cope with loss, reducing people pleasing, and improving sleep for 5 out of 7 days for 60 days.  ProgressTowards Goals: Progressing  Interventions: Therapist utilized CBT and Solution Focused brief therapy to address mood and anxiety. Therapist provided support and empathy to patient during session. Therapist explored patient's interpersonal relationships, and how patient has been able to express herself and maintain boundaries.   Effectiveness: Patient was oriented x4 (person, place, situation, and time). Patient was casually dressed, and appropriately groomed. Patient was alert, engaged, pleasant, and cooperative. Patient noted that she had a disagreement with her boyfriend. She bought tickets to a see a movie that he wanted to see, and she asked him to call her the day off so they could spend time together. He never called. She went to his home and expressed her frustration. She also said that she was not going to let the tickets go to waste and after the movie she would go home. She did just that and was able to go to sleep rather than ruminating on it. Patient struggles on if she is being unreasonable to speak up or if what she is feeling is an unreasonable response related to her past toxic marriage. Patient was able to identify her boyfriends behavior was not appropriate and it was not an unreasonable response. Patient has been dealing with being alone on the weekends when her daughter is out.   Patient engaged in session. Patient responded well to interventions. Patient continues to meet criteria for Major depressive disorder, recurrent episode, moderate with anxious distress. Patient will continue in outpatient  therapy due to being the least restrictive service to meet her needs. Patient made minimal progress on goals.   Suicidal/Homicidal: Nowithout intent/plan  Plan: Return again in 2-4 weeks. Patient will continue to set boundaries and express her feelings.   Diagnosis: Major depressive disorder, recurrent episode, moderate with anxious distress (HCC)  Collaboration of Care: Other sources will be identified.   Patient/Guardian was advised Release of Information must be obtained prior to any record release in order to collaborate their care with an outside provider. Patient/Guardian was advised if they have not already done so to contact the registration department to sign all necessary forms in order for [place] to release information regarding their care.   Consent: Patient/Guardian gives verbal consent for treatment and assignment of benefits for services provided during this visit. Patient/Guardian expressed understanding and agreed to proceed.   Bynum Bellows, LCSW 03/18/2022

## 2022-03-31 ENCOUNTER — Ambulatory Visit (INDEPENDENT_AMBULATORY_CARE_PROVIDER_SITE_OTHER): Payer: Commercial Managed Care - HMO | Admitting: Licensed Clinical Social Worker

## 2022-03-31 DIAGNOSIS — F331 Major depressive disorder, recurrent, moderate: Secondary | ICD-10-CM

## 2022-04-01 ENCOUNTER — Ambulatory Visit: Payer: 59 | Admitting: Orthopaedic Surgery

## 2022-04-01 NOTE — Progress Notes (Signed)
   THERAPIST PROGRESS NOTE  Session Time: 1:00 pm-1:45 pm  Type of Therapy: Individual Therapy  Session#3  Purpose of Session: Christy Haley will manage mood and anxiety as evidenced by improving self esteem, challenging anxious and depressive thoughts, managing racing thoughts/overthinking, cope with loss, reducing people pleasing, and improving sleep for 5 out of 7 days for 60 days.  ProgressTowards Goals: Progressing  Interventions: Therapist utilized CBT and Solution Focused brief therapy to address mood and anxiety. Therapist provided support and empathy to patient during session. Therapist processed patient's feelings about a situation with her boyfriend and how her she responded. Therapist worked with patient to identify her values/how she would like to respond.   Effectiveness: Patient was oriented x4 (person, place, situation, and time). Patient was casually dressed, and appropriately groomed. Patient was alert, engaged, pleasant, and cooperative. Patient noted that things have been ok for her. She had a situation where her boyfriend called and cancelled last minute after she had made food. She got upset and instantly got defensive. She didn't try to hear him out even though he was trying to explain that he got behind schedule at work. Patient hung up the phone and felt bad. She started to ruminate and think about how her ex treated her. She caught herself acting/thinking this way and was able to stop it. She noted the next time she would take a breath, ask questions, and allow him to speak. She would focus on her own thoughts and reactions. Patient understood she could express herself without getting aggressive. She noted that she gets a tone when angry and doesn't want to be that person.   Patient engaged in session. Patient responded well to interventions. Patient continues to meet criteria for Major depressive disorder, recurrent episode, moderate with anxious distress. Patient will continue in  outpatient therapy due to being the least restrictive service to meet her needs. Patient made moderate progress on goals.   Suicidal/Homicidal: Nowithout intent/plan  Plan: Return again in 2-4 weeks. Patient will continue to catch her own thoughts and reactions rather than focus on what others have done or said.   Diagnosis: Major depressive disorder, recurrent episode, moderate with anxious distress ([place])  Collaboration of Care: Other sources will be identified.   Patient/Guardian was advised Release of Information must be obtained prior to any record release in order to collaborate their care with an outside provider. Patient/Guardian was advised if they have not already done so to contact the registration department to sign all necessary forms in order for [place] to release information regarding their care.   Consent: Patient/Guardian gives verbal consent for treatment and assignment of benefits for services provided during this visit. Patient/Guardian expressed understanding and agreed to proceed.   Glori Bickers, LCSW 04/01/2022

## 2022-05-07 ENCOUNTER — Ambulatory Visit (INDEPENDENT_AMBULATORY_CARE_PROVIDER_SITE_OTHER): Payer: Commercial Managed Care - HMO | Admitting: Licensed Clinical Social Worker

## 2022-05-07 DIAGNOSIS — F331 Major depressive disorder, recurrent, moderate: Secondary | ICD-10-CM | POA: Diagnosis not present

## 2022-05-07 NOTE — Progress Notes (Signed)
   THERAPIST PROGRESS NOTE  Session Time: 8:00 am-8:55 am  Type of Therapy: Individual Therapy  Session#4  Purpose of Session: [place] will manage mood and anxiety as evidenced by improving self esteem, challenging anxious and depressive thoughts, managing racing thoughts/overthinking, cope with loss, reducing people pleasing, and improving sleep for 5 out of 7 days for 60 days.  ProgressTowards Goals: Progressing  Interventions: Therapist utilized CBT, Solution focused brief therapy, and ACT to address anxiety and depression. Therapist provided support and empathy to patient during session. Therapist worked with patient to complete a life map including: 1) what or who is most important to you, 2) What thoughts, feelings, or sensations get in the way of moving forward, 3) what do you do to move away from those difficult inner experiences, and 4) what could you do to move toward who's important to you?  Effectiveness: Patient was oriented x4 (person, place, situation, and time). Patient was casually dressed, and appropriately groomed. Patient was alert, engaged, pleasant, and cooperative. Patient noted that things have been doing better. She has only had two times where she got upset with her partner or others. Patient was able to identify what was important 1) Daughters, God, Family, stability, security, emotional health, happiness on her own, friends, 2) previous marriage, uncertainty, blow things out of proportion, personalization, excuse behavior, question behavior, insecurity, 3) flip over/attitude/defensive, stay busy, drinking, 4) separate past from present, give self time to think and relax, think positive, pay attention to own reactions.   Patient engaged in session. Patient responded well to interventions. Patient continues to meet criteria for Major depressive disorder, recurrent episode, moderate with anxious distress. Patient will continue in outpatient therapy due to being the least  restrictive service to meet her needs. Patient made moderate progress on goals.   Suicidal/Homicidal: Nowithout intent/plan  Plan: Return again in 2-4 weeks.   Diagnosis: Major depressive disorder, recurrent episode, moderate with anxious distress ([place])  Collaboration of Care: Other sources will be identified.   Patient/Guardian was advised Release of Information must be obtained prior to any record release in order to collaborate their care with an outside provider. Patient/Guardian was advised if they have not already done so to contact the registration department to sign all necessary forms in order for [place] to release information regarding their care.   Consent: Patient/Guardian gives verbal consent for treatment and assignment of benefits for services provided during this visit. Patient/Guardian expressed understanding and agreed to proceed.   Glori Bickers, LCSW 05/07/2022

## 2022-05-14 ENCOUNTER — Ambulatory Visit (INDEPENDENT_AMBULATORY_CARE_PROVIDER_SITE_OTHER): Payer: Commercial Managed Care - HMO | Admitting: Radiology

## 2022-05-14 VITALS — BP 102/68

## 2022-05-14 DIAGNOSIS — N898 Other specified noninflammatory disorders of vagina: Secondary | ICD-10-CM | POA: Diagnosis not present

## 2022-05-14 DIAGNOSIS — N9089 Other specified noninflammatory disorders of vulva and perineum: Secondary | ICD-10-CM | POA: Diagnosis not present

## 2022-05-14 DIAGNOSIS — Z23 Encounter for immunization: Secondary | ICD-10-CM

## 2022-05-14 MED ORDER — DOXYCYCLINE HYCLATE 100 MG PO CAPS: 100.0000 mg | ORAL_CAPSULE | Freq: Two times a day (BID) | ORAL | 0 refills | Status: DC

## 2022-05-14 NOTE — Addendum Note (Signed)
Addended by: Rubbie Battiest on: 05/14/2022 09:13 AM   Modules accepted: Orders

## 2022-05-14 NOTE — Progress Notes (Signed)
      Subjective: Christy Haley is a 51 y.o. female who complains of vaginal discharge, itching, odor x's 4 weeks.  Noticed rash on labia 4 days ago. Dessires flu vaccination today.     Review of Systems  All other systems reviewed and are negative.   Past Medical History:  Diagnosis Date   Anxiety    Depression    GERD (gastroesophageal reflux disease)       Objective:  Today's Vitals   05/14/22 0801  BP: 102/68   There is no height or weight on file to calculate BMI.   -General: no acute distress -Vulva: 2 open vesicular lesions present, swabbed for HSV, >14 follicules with erythema -Vagina: discharge present, aptima swab obtained -Cervix: no lesion or discharge, no CMT -Perineum: no lesions -Uterus: Mobile, non tender -Adnexa: no masses or tenderness   Chaperone offered and declined.  Assessment:/Plan:   1. Vaginal discharge - WET PREP FOR [place], YEAST, CLUE - SureSwab Advanced Vaginitis Plus,TMA  2. Need for immunization against influenza - Flu Vaccine QUAD 53mo+IM (Fluarix, Fluzone & Alfiuria Quad PF)  3. Vulvar lesion Folliculitis with question of 2 possible HSV lesions, swab obtained Rx sent for doxy 100mg  po BID x 7 days Throw out razor Comfort measures reviewed - SureSwab HSV, Type 1/2 DNA, PCR    Will contact patient with results of testing completed today. Avoid intercourse until symptoms are resolved. Safe sex encouraged. Avoid the use of soaps or perfumed products in the peri area. Avoid tub baths and sitting in sweaty or wet clothing for prolonged periods of time.

## 2022-05-15 LAB — SURESWAB® ADVANCED VAGINITIS PLUS,TMA
C. trachomatis RNA, TMA: NOT DETECTED
CANDIDA SPECIES: NOT DETECTED
Candida glabrata: NOT DETECTED
N. gonorrhoeae RNA, TMA: NOT DETECTED
SURESWAB(R) ADV BACTERIAL VAGINOSIS(BV),TMA: NEGATIVE
TRICHOMONAS VAGINALIS (TV),TMA: DETECTED — AB

## 2022-05-16 LAB — SURESWAB HSV, TYPE 1/2 DNA, PCR
HSV 1 DNA: NOT DETECTED
HSV 2 DNA: DETECTED — AB

## 2022-05-18 ENCOUNTER — Other Ambulatory Visit: Payer: Self-pay | Admitting: *Deleted

## 2022-05-18 MED ORDER — VALACYCLOVIR HCL 1 G PO TABS: 1000.0000 mg | ORAL_TABLET | Freq: Two times a day (BID) | ORAL | 0 refills | Status: DC

## 2022-05-18 MED ORDER — METRONIDAZOLE 500 MG PO TABS: 500.0000 mg | ORAL_TABLET | Freq: Two times a day (BID) | ORAL | 0 refills | Status: DC

## 2022-05-21 ENCOUNTER — Ambulatory Visit (HOSPITAL_COMMUNITY): Payer: 59 | Admitting: Licensed Clinical Social Worker

## 2022-06-10 ENCOUNTER — Ambulatory Visit (INDEPENDENT_AMBULATORY_CARE_PROVIDER_SITE_OTHER): Payer: Managed Care, Other (non HMO) | Admitting: Nurse Practitioner

## 2022-06-10 ENCOUNTER — Encounter: Payer: Self-pay | Admitting: Nurse Practitioner

## 2022-06-10 VITALS — BP 124/81 | HR 68 | Temp 97.8°F | Ht 63.25 in | Wt 143.0 lb

## 2022-06-10 DIAGNOSIS — R42 Dizziness and giddiness: Secondary | ICD-10-CM | POA: Diagnosis not present

## 2022-06-10 NOTE — Progress Notes (Signed)
Acute Office Visit  Subjective:     Patient ID: Christy Haley, female    DOB: 07/14/1970, 51 y.o.   MRN: [id number]  Chief Complaint  Patient presents with   Dizziness    Pt c/o feeling dizzy x 1 week Sunday almost fainted short of oxygen and got dizzy. Also  feeling fatigue.    HPI Patient is in today for dizziness for the last week.  She states that she has had increase in stress and she recently finished 2 different antibiotics.  She states that when she was at church, she felt like she was almost going to pass out.  She stepped outside and got some fresh air and her symptoms went away.  She states that the room is not spinning.  She is not sure of anything that makes it worse.  She tries to drink a lot of water.  She denies chest pain and headaches.  She states the last time this happened, her iron counts were low.  ROS See pertinent positives and negatives per HPI.     Objective:    BP 124/81 (BP Location: Right Arm, Patient Position: Sitting, Cuff Size: Normal)   Pulse 68   Temp 97.8 F (36.6 C) (Temporal)   Ht 5' 3.25" (1.607 m)   Wt 143 lb (64.9 kg)   SpO2 99%   BMI 25.13 kg/m    Physical Exam Vitals and nursing note reviewed.  Constitutional:      General: She is not in acute distress.    Appearance: Normal appearance.  HENT:     Head: Normocephalic.  Eyes:     Conjunctiva/sclera: Conjunctivae normal.     Pupils: Pupils are equal, round, and reactive to light.  Cardiovascular:     Rate and Rhythm: Normal rate and regular rhythm.     Pulses: Normal pulses.     Heart sounds: Normal heart sounds.  Pulmonary:     Effort: Pulmonary effort is normal.     Breath sounds: Normal breath sounds.  Musculoskeletal:     Cervical back: Normal range of motion.  Skin:    General: Skin is warm.  Neurological:     General: No focal deficit present.     Mental Status: She is alert and oriented to person, place, and time.     Cranial Nerves: No cranial nerve deficit.      Motor: No weakness.     Coordination: Coordination normal.     Gait: Gait normal.  Psychiatric:        Mood and Affect: Mood normal.        Behavior: Behavior normal.        Thought Content: Thought content normal.        Judgment: Judgment normal.      Assessment & Plan:   Problem List Items Addressed This Visit       Other   Dizziness - Primary    She has been experiencing dizziness for the last week.  No red flags on exam today.  Orthostatic vital signs negative.  She states last time this happened, her iron counts are low.  Will check CMP, CBC, TSH, and iron panel.  Encouraged her to continue drinking plenty of fluids, she can add in a Gatorade daily.  Follow-up if symptoms worsen or do not improve.      Relevant Orders   CBC with Differential/Platelet (Completed)   Comprehensive metabolic panel (Completed)   TSH (Completed)   Iron, TIBC and Ferritin Panel (  Completed)    No orders of the defined types were placed in this encounter.   Return if symptoms worsen or fail to improve.  Gerre Scull, NP

## 2022-06-10 NOTE — Patient Instructions (Signed)
It was great to see you!  We are checking your labs today and will let you know the results via mychart/phone.   Try drinking a gatorade once a day or every other day for a few days to see if this helps.   Let's follow-up if your symptoms worsen or don't improve.   Take care,  Rodman Pickle, NP

## 2022-06-11 ENCOUNTER — Encounter: Payer: Self-pay | Admitting: Nurse Practitioner

## 2022-06-11 DIAGNOSIS — R42 Dizziness and giddiness: Secondary | ICD-10-CM | POA: Insufficient documentation

## 2022-06-11 LAB — COMPREHENSIVE METABOLIC PANEL
ALT: 24 U/L (ref 0–35)
AST: 28 U/L (ref 0–37)
Albumin: 4.8 g/dL (ref 3.5–5.2)
Alkaline Phosphatase: 43 U/L (ref 39–117)
BUN: 24 mg/dL — ABNORMAL HIGH (ref 6–23)
CO2: 26 mEq/L (ref 19–32)
Calcium: 10.1 mg/dL (ref 8.4–10.5)
Chloride: 102 mEq/L (ref 96–112)
Creatinine, Ser: 0.82 mg/dL (ref 0.40–1.20)
GFR: 83.01 mL/min (ref >60.00)
Glucose, Bld: 81 mg/dL (ref 70–99)
Potassium: 3.8 mEq/L (ref 3.5–5.1)
Sodium: 138 mEq/L (ref 135–145)
Total Bilirubin: 0.4 mg/dL (ref 0.2–1.2)
Total Protein: 7.3 g/dL (ref 6.0–8.3)

## 2022-06-11 LAB — CBC WITH DIFFERENTIAL/PLATELET
Basophils Absolute: 0 10*3/uL (ref 0.0–0.1)
Basophils Relative: 0.5 % (ref 0.0–3.0)
Eosinophils Absolute: 0 10*3/uL (ref 0.0–0.7)
Eosinophils Relative: 0.8 % (ref 0.0–5.0)
HCT: 38 % (ref 36.0–46.0)
Hemoglobin: 12.5 g/dL (ref 12.0–15.0)
Lymphocytes Relative: 25.3 % (ref 12.0–46.0)
Lymphs Abs: 1 10*3/uL (ref 0.7–4.0)
MCHC: 32.9 g/dL (ref 30.0–36.0)
MCV: 84.7 fl (ref 78.0–100.0)
Monocytes Absolute: 0.3 10*3/uL (ref 0.1–1.0)
Monocytes Relative: 6.4 % (ref 3.0–12.0)
Neutro Abs: 2.8 10*3/uL (ref 1.4–7.7)
Neutrophils Relative %: 67 % (ref 43.0–77.0)
Platelets: 324 10*3/uL (ref 150.0–400.0)
RBC: 4.49 Mil/uL (ref 3.87–5.11)
RDW: 13.8 % (ref 11.5–15.5)
WBC: 4.1 10*3/uL (ref 4.0–10.5)

## 2022-06-11 LAB — IRON,TIBC AND FERRITIN PANEL
%SAT: 29 % (calc) (ref 16–45)
Ferritin: 67 ng/mL (ref 16–232)
Iron: 92 ug/dL (ref 45–160)
TIBC: 315 mcg/dL (calc) (ref 250–450)

## 2022-06-11 LAB — TSH: TSH: 0.83 u[IU]/mL (ref 0.35–5.50)

## 2022-06-11 NOTE — Assessment & Plan Note (Signed)
She has been experiencing dizziness for the last week.  No red flags on exam today.  Orthostatic vital signs negative.  She states last time this happened, her iron counts are low.  Will check CMP, CBC, TSH, and iron panel.  Encouraged her to continue drinking plenty of fluids, she can add in a Gatorade daily.  Follow-up if symptoms worsen or do not improve.

## 2022-06-18 ENCOUNTER — Ambulatory Visit: Payer: Self-pay | Admitting: Nurse Practitioner

## 2022-06-23 ENCOUNTER — Ambulatory Visit (INDEPENDENT_AMBULATORY_CARE_PROVIDER_SITE_OTHER): Payer: Commercial Managed Care - HMO | Admitting: Radiology

## 2022-06-23 VITALS — BP 104/78

## 2022-06-23 DIAGNOSIS — Z202 Contact with and (suspected) exposure to infections with a predominantly sexual mode of transmission: Secondary | ICD-10-CM | POA: Diagnosis not present

## 2022-06-23 NOTE — Progress Notes (Signed)
      Subjective: Christy Haley is a 51 y.o. female here for TOC after being treated for trich 6 weeks ago. She was also diagnosed with herpes at that visit, no further outbreaks since. No longer with partner, he was however notified.    Review of Systems  All other systems reviewed and are negative.   Past Medical History:  Diagnosis Date   Anxiety    Depression    GERD (gastroesophageal reflux disease)       Objective:  Today's Vitals   06/23/22 0758  BP: 104/78   There is no height or weight on file to calculate BMI.   -General: no acute distress -Vulva: without lesions or discharge -Vagina: discharge present, aptima swab obtained -Cervix: no lesion or discharge, no CMT -Perineum: no lesions -Uterus: Mobile, non tender -Adnexa: no masses or tenderness    Chaperone offered and declined.  Assessment:/Plan:   1. Trichomonas contact, treated  - SURESWAB CT/NG/T. vaginalis    Will contact patient with results of testing completed today. Avoid intercourse until symptoms are resolved. Safe sex encouraged. Avoid the use of soaps or perfumed products in the peri area. Avoid tub baths and sitting in sweaty or wet clothing for prolonged periods of time.

## 2022-06-24 LAB — SURESWAB CT/NG/T. VAGINALIS
C. trachomatis RNA, TMA: NOT DETECTED
N. gonorrhoeae RNA, TMA: NOT DETECTED
Trichomonas vaginalis RNA: NOT DETECTED

## 2022-07-02 ENCOUNTER — Ambulatory Visit (HOSPITAL_COMMUNITY): Payer: Commercial Managed Care - HMO | Admitting: Licensed Clinical Social Worker

## 2022-07-02 ENCOUNTER — Ambulatory Visit (INDEPENDENT_AMBULATORY_CARE_PROVIDER_SITE_OTHER): Payer: Commercial Managed Care - HMO | Admitting: Licensed Clinical Social Worker

## 2022-07-02 DIAGNOSIS — F331 Major depressive disorder, recurrent, moderate: Secondary | ICD-10-CM

## 2022-07-03 NOTE — Progress Notes (Signed)
   THERAPIST PROGRESS NOTE  Session Time: 8:00 am-8:55 am  Type of Therapy: Individual Therapy  Session#5  Purpose of Session: Christy Haley will manage mood and anxiety as evidenced by improving self esteem, challenging anxious and depressive thoughts, managing racing thoughts/overthinking, cope with loss, reducing people pleasing, and improving sleep for 5 out of 7 days for 60 days.  ProgressTowards Goals: Progressing  Interventions: Therapist utilized CBT, Solution focused brief therapy, and ACT to address anxiety and depression. Therapist provided support and empathy to patient during session. Therapist processed patient's interpersonal relationship and coping with her recent break up.   Effectiveness: Patient was oriented x4 (person, place, situation, and time). Patient was casually dressed, and appropriately groomed. Patient was alert, engaged, pleasant, and cooperative. Patient broke up with her boyfriend. She had a conversation with him about his continued disrespect: giving the the silent treatment, not calling and showing up hours late, not calling or texting her at all during the day, etc. She is not used to speaking up for herself but did. She has not reached out to him and doesn't plan to. She admits that she has broke up with him in the past but would reach out to him. She is going to try to remember that she doesn't want to be treated this way and even though she was in a relationship with him she felt like she was single. Patient feels like Saturday is her most difficult day since that was the day she would get to see him if she saw him. Patient said he would come late and leave early. Patient is trying to spend time with her friend or take this as a chance to get to know herself but also heal. She is going to spend her Saturdays doing something fun to change the association.   Patient engaged in session. Patient responded well to interventions. Patient continues to meet criteria for Major  depressive disorder, recurrent episode, moderate with anxious distress. Patient will continue in outpatient therapy due to being the least restrictive service to meet her needs. Patient made moderate progress on goals.   Suicidal/Homicidal: Nowithout intent/plan  Plan: Return again in 2-4 weeks. Patient will take small opportunities to be "by herself" on Saturdays or spending time with friends or family.   Diagnosis: Major depressive disorder, recurrent episode, moderate with anxious distress (HCC)  Collaboration of Care: Other sources will be identified.   Patient/Guardian was advised Release of Information must be obtained prior to any record release in order to collaborate their care with an outside provider. Patient/Guardian was advised if they have not already done so to contact the registration department to sign all necessary forms in order for [place] to release information regarding their care.   Consent: Patient/Guardian gives verbal consent for treatment and assignment of benefits for services provided during this visit. Patient/Guardian expressed understanding and agreed to proceed.   Bynum Bellows, LCSW 07/03/2022

## 2022-07-16 ENCOUNTER — Ambulatory Visit (INDEPENDENT_AMBULATORY_CARE_PROVIDER_SITE_OTHER): Payer: PRIVATE HEALTH INSURANCE | Admitting: Licensed Clinical Social Worker

## 2022-07-16 DIAGNOSIS — F331 Major depressive disorder, recurrent, moderate: Secondary | ICD-10-CM | POA: Diagnosis not present

## 2022-07-17 NOTE — Progress Notes (Signed)
   THERAPIST PROGRESS NOTE  Session Time: 8:00 am-8:45 am  Type of Therapy: Individual Therapy  Session#6  Purpose of Session: Christy Haley will manage mood and anxiety as evidenced by improving self esteem, challenging anxious and depressive thoughts, managing racing thoughts/overthinking, cope with loss, reducing people pleasing, and improving sleep for 5 out of 7 days for 60 days.  ProgressTowards Goals: Progressing  Interventions: Therapist utilized CBT, Solution focused brief therapy, and ACT to address anxiety and depression. Therapist provided support and empathy to patient during session. Therapist processed patient's feelings about her break up and how she is working on changing how she views the break up and herself.   Effectiveness: Patient was oriented x4 (person, place, situation, and time). Patient was casually dressed, and appropriately groomed. Patient was alert, engaged, pleasant, and cooperative. Patient noted that her mood has been stable. She is changing the way that she has thought about the break up. She is only accepting her part in the break up and not trying to blame herself for the whole break up. She understands that her expectations for he relationship are reasonable including not tolerating waiting for hours with no phone call for her partner to show up, or a text a day. Patient is working on spending time by herself. She is working on [employer], listening to music, etc by herself. She is going to get out on the weekends by herself. She is going to avoid drinking alcohol due to it being a depressant. Patient is working on herself to feel better about herself, and not settle for less.   Patient engaged in session. Patient responded well to interventions. Patient continues to meet criteria for Major depressive disorder, recurrent episode, moderate with anxious distress. Patient will continue in outpatient therapy due to being the least restrictive service to meet her needs.  Patient made moderate progress on goals.   Suicidal/Homicidal: Nowithout intent/plan  Plan: Return again in 2-4 weeks. Patient will continue to work on her frame of mind and spending time by herself.    Diagnosis: Major depressive disorder, recurrent episode, moderate with anxious distress ([place])  Collaboration of Care: Other sources will be identified.   Patient/Guardian was advised Release of Information must be obtained prior to any record release in order to collaborate their care with an outside provider. Patient/Guardian was advised if they have not already done so to contact the registration department to sign all necessary forms in order for [place] to release information regarding their care.   Consent: Patient/Guardian gives verbal consent for treatment and assignment of benefits for services provided during this visit. Patient/Guardian expressed understanding and agreed to proceed.   Glori Bickers, LCSW 07/17/2022

## 2022-07-19 ENCOUNTER — Telehealth: Payer: 59 | Admitting: Emergency Medicine

## 2022-07-19 DIAGNOSIS — K529 Noninfective gastroenteritis and colitis, unspecified: Secondary | ICD-10-CM

## 2022-07-19 MED ORDER — ONDANSETRON HCL 4 MG PO TABS: 4.0000 mg | ORAL_TABLET | Freq: Three times a day (TID) | ORAL | 0 refills | Status: DC | PRN

## 2022-07-19 NOTE — Progress Notes (Signed)
Virtual Visit Consent   Christy Haley, you are scheduled for a virtual visit with a [facility] provider today. Just as with appointments in the office, your consent must be obtained to participate. Your consent will be active for this visit and any virtual visit you may have with one of our providers in the next 365 days. If you have a MyChart account, a copy of this consent can be sent to you electronically.  As this is a virtual visit, video technology does not allow for your provider to perform a traditional examination. This may limit your provider's ability to fully assess your condition. If your provider identifies any concerns that need to be evaluated in person or the need to arrange testing (such as labs, EKG, etc.), we will make arrangements to do so. Although advances in technology are sophisticated, we cannot ensure that it will always work on either your end or our end. If the connection with a video visit is poor, the visit may have to be switched to a telephone visit. With either a video or telephone visit, we are not always able to ensure that we have a secure connection.  By engaging in this virtual visit, you consent to the provision of healthcare and authorize for your insurance to be billed (if applicable) for the services provided during this visit. Depending on your insurance coverage, you may receive a charge related to this service.  I need to obtain your verbal consent now. Are you willing to proceed with your visit today? Christy Haley has provided verbal consent on 07/19/2022 for a virtual visit (video or telephone). Christy Parsons, NP  Date: 07/19/2022 11:25 AM  Virtual Visit via Video Note   I, Christy Haley, connected with  Christy Haley  ([id number], 08/19/1970) on 07/19/22 at 11:15 AM EST by a video-enabled telemedicine application and verified that I am speaking with the correct person using two identifiers.  Location: Patient: Virtual Visit Location Patient:  Home Provider: Virtual Visit Location Provider: Home Office   I discussed the limitations of evaluation and management by telemedicine and the availability of in person appointments. The patient expressed understanding and agreed to proceed.    History of Present Illness: Christy Haley is a 52 y.o. who identifies as a female who was assigned female at birth, and is being seen today for vomiting and diarrhea.  Patient ate shrimp the evening of 07/17/2022 and then began feeling badly and having symptoms 07/18/2022.  Yesterday patient vomited about 5 times.  Has not vomited since last night at 5 PM.  Yesterday she had 3 episodes of diarrhea today she has had 2 episodes of diarrhea.  She reports the diarrhea is light brown water.  There is no mucus or blood in it.  She has not been out of the country she has not been drinking from contaminated water sources.  It sounds like she did not alter her diet initially and continue to try to eat and drink.  Since yesterday evening, she has not had anything to drink but Gatorade.  The only medicine she took for her symptoms were Tums.  She reports generalized abdominal pain and a feeling like her abdomen is distended.  The pain is not in any 1 particular area.  She also describes cramping when she has diarrhea.  Denies fever.  Reports she had chills yesterday and took Advil yesterday for it.  No one else in the household is sick at this time.  HPI: HPI  Problems:  Patient Active Problem List   Diagnosis Date Noted   Dizziness 06/11/2022   Unilateral primary osteoarthritis, right knee 11/25/2021   Insomnia 11/20/2021   Recurrent major depressive disorder, in partial remission ([place]) 01/12/2017   Generalized anxiety disorder 02/20/2015   IUD (intrauterine device) in place 08/12/2014   Cervical lesion 06/21/2014   Hyperglycemia 04/24/2013   Muscle tiredness 04/12/2013   Alopecia 04/12/2013   Loss of weight 04/12/2013   Hot flushes, perimenopausal 04/12/2013   Iron  deficiency anemia 12/07/2012   Left lumbar radiculitis 12/07/2012   Leukopenia 01/14/2012   GERD 01/06/2012    Allergies:  Allergies  Allergen Reactions   Oxycodone Rash   Medications:  Current Outpatient Medications:    ondansetron (ZOFRAN) 4 MG tablet, Take 1 tablet (4 mg total) by mouth every 8 (eight) hours as needed for nausea or vomiting., Disp: 20 tablet, Rfl: 0   buPROPion (WELLBUTRIN XL) 300 MG 24 hr tablet, Take 1 tablet (300 mg total) by mouth daily., Disp: 90 tablet, Rfl: 1   COLLAGEN PO, Take by mouth., Disp: , Rfl:    Multiple Vitamin (MULTIVITAMIN) tablet, Take 1 tablet by mouth daily., Disp: , Rfl:    traZODone (DESYREL) 50 MG tablet, Take 0.5-1 tablets (25-50 mg total) by mouth at bedtime as needed for sleep., Disp: 90 tablet, Rfl: 1  Current Facility-Administered Medications:    levonorgestrel (MIRENA) 20 MCG/24HR IUD, , Intrauterine, Once, Terrance Mass, MD  Observations/Objective: Patient is well-developed, well-nourished in no acute distress.  Resting comfortably  at home.  Head is normocephalic, atraumatic.  No labored breathing.  Speech is clear and coherent with logical content.  Patient is alert and oriented at baseline.    Assessment and Plan: 1. Gastroenteritis  I prescribed Zofran.  I recommended Imodium.  Recommended clear liquid diet until she feels completely better.  Reviewed reasons for seeking a higher level of care.  Follow Up Instructions: I discussed the assessment and treatment plan with the patient. The patient was provided an opportunity to ask questions and all were answered. The patient agreed with the plan and demonstrated an understanding of the instructions.  A copy of instructions were sent to the patient via MyChart unless otherwise noted below.   The patient was advised to call back or seek an in-person evaluation if the symptoms worsen or if the condition fails to improve as anticipated.  Time:  I spent 10 minutes with the  patient via telehealth technology discussing the above problems/concerns.    Carvel Getting, NP

## 2022-07-19 NOTE — Patient Instructions (Signed)
  Malachy Moan, thank you for joining Carvel Getting, NP for today's virtual visit.  While this provider is not your primary care provider (PCP), if your PCP is located in our provider database this encounter information will be shared with them immediately following your visit.   [facility] account gives you access to today's visit and all your visits, tests, and labs performed at [facility] " click here if you don't have a [facility] account or go to [email or web address]  Consent: (Patient) Christy Haley provided verbal consent for this virtual visit at the beginning of the encounter.  Current Medications:  Current Outpatient Medications:    ondansetron (ZOFRAN) 4 MG tablet, Take 1 tablet (4 mg total) by mouth every 8 (eight) hours as needed for nausea or vomiting., Disp: 20 tablet, Rfl: 0   buPROPion (WELLBUTRIN XL) 300 MG 24 hr tablet, Take 1 tablet (300 mg total) by mouth daily., Disp: 90 tablet, Rfl: 1   COLLAGEN PO, Take by mouth., Disp: , Rfl:    Multiple Vitamin (MULTIVITAMIN) tablet, Take 1 tablet by mouth daily., Disp: , Rfl:    traZODone (DESYREL) 50 MG tablet, Take 0.5-1 tablets (25-50 mg total) by mouth at bedtime as needed for sleep., Disp: 90 tablet, Rfl: 1  Current Facility-Administered Medications:    levonorgestrel (MIRENA) 20 MCG/24HR IUD, , Intrauterine, Once, Terrance Mass, MD   Medications ordered in this encounter:  Meds ordered this encounter  Medications   ondansetron (ZOFRAN) 4 MG tablet    Sig: Take 1 tablet (4 mg total) by mouth every 8 (eight) hours as needed for nausea or vomiting.    Dispense:  20 tablet    Refill:  0     *If you need refills on other medications prior to your next appointment, please contact your pharmacy*  Follow-Up: Call back or seek an in-person evaluation if the symptoms worsen or if the condition fails to improve as anticipated.  [facility] [phone]  Other  Instructions Purchase Imodium and take it for diarrhea according to package instructions.  Focus on clear liquids only at this time.  Do not try to eat solid food until you are feeling much better.  See the attached handout on clear liquid diet.  Use the Zofran (ondansetron) nausea medicine if needed for nausea or vomiting.   If you have been instructed to have an in-person evaluation today at a local Urgent Care facility, please use the link below. It will take you to a list of all of our available [facility], including address, phone number and hours of operation. Please do not delay care.  [facility]  If you or a family member do not have a primary care provider, use the link below to schedule a visit and establish care. When you choose a [facility] primary care physician or advanced practice provider, you gain a long-term partner in health. Find a Primary Care Provider  Learn more about [facility]'s in-office and virtual care options: [facility]

## 2022-07-26 ENCOUNTER — Ambulatory Visit (INDEPENDENT_AMBULATORY_CARE_PROVIDER_SITE_OTHER): Payer: 59 | Admitting: Licensed Clinical Social Worker

## 2022-07-26 DIAGNOSIS — F331 Major depressive disorder, recurrent, moderate: Secondary | ICD-10-CM | POA: Diagnosis not present
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
## 2022-07-29 ENCOUNTER — Other Ambulatory Visit: Payer: Self-pay | Admitting: Nurse Practitioner

## 2022-07-29 DIAGNOSIS — F3341 Major depressive disorder, recurrent, in partial remission: Secondary | ICD-10-CM

## 2022-07-29 DIAGNOSIS — F411 Generalized anxiety disorder: Secondary | ICD-10-CM

## 2022-07-29 NOTE — Telephone Encounter (Signed)
Chart supports Rx Last OV: 05/2022 Next OV: not scheduled   

## 2022-08-06 ENCOUNTER — Telehealth: Payer: Self-pay

## 2022-08-06 DIAGNOSIS — Z1231 Encounter for screening mammogram for malignant neoplasm of breast: Secondary | ICD-10-CM

## 2022-08-06 NOTE — Telephone Encounter (Signed)
MM order placed for BCG, per patient request.

## 2022-08-09 ENCOUNTER — Other Ambulatory Visit: Payer: Self-pay | Admitting: Nurse Practitioner

## 2022-08-13 ENCOUNTER — Ambulatory Visit (HOSPITAL_COMMUNITY): Payer: Commercial Managed Care - HMO | Admitting: Licensed Clinical Social Worker

## 2022-08-26 ENCOUNTER — Ambulatory Visit (INDEPENDENT_AMBULATORY_CARE_PROVIDER_SITE_OTHER): Payer: 59 | Admitting: Nurse Practitioner

## 2022-08-26 ENCOUNTER — Encounter: Payer: Self-pay | Admitting: Nurse Practitioner

## 2022-08-26 VITALS — BP 116/68 | HR 92 | Temp 101.1°F | Ht 63.25 in | Wt 141.0 lb

## 2022-08-26 DIAGNOSIS — R509 Fever, unspecified: Secondary | ICD-10-CM

## 2022-08-26 DIAGNOSIS — J101 Influenza due to other identified influenza virus with other respiratory manifestations: Secondary | ICD-10-CM

## 2022-08-26 LAB — POCT INFLUENZA A/B
Influenza A, POC: POSITIVE — AB
Influenza B, POC: NEGATIVE

## 2022-08-26 LAB — POC COVID19 BINAXNOW: SARS Coronavirus 2 Ag: NEGATIVE

## 2022-08-26 MED ORDER — OSELTAMIVIR PHOSPHATE 75 MG PO CAPS: 75.0000 mg | ORAL_CAPSULE | Freq: Two times a day (BID) | ORAL | 0 refills | Status: DC

## 2022-08-26 NOTE — Progress Notes (Signed)
Acute Office Visit  Subjective:     Patient ID: Christy Haley, female    DOB: 08/23/1970, 52 y.o.   MRN: [id number]  Chief Complaint  Patient presents with   Fever    Since Tuesday, sorethroat, body aches, chest pain, ears hurt    Patient is in today for sore throat, body aches, and ear pain for the last 2 days days.   UPPER RESPIRATORY TRACT INFECTION  Fever: yes - 102.4 Cough: yes Shortness of breath: no Wheezing: no Chest pain: yes, with cough Chest tightness: no Chest congestion: no Nasal congestion: yes Runny nose: yes Post nasal drip: no Sneezing: no Sore throat: yes Swollen glands: no Sinus pressure: no Headache: yes Face pain: no Toothache: no Ear pain: yes bilateral Ear pressure: no bilateral Eyes red/itching:no Eye drainage/crusting: no  Vomiting: no Rash: no Fatigue: yes Sick contacts: no Strep contacts: no  Context: worse Recurrent sinusitis: no Relief with OTC cold/cough medications: no  Treatments attempted: tylenol, ibuprofen  ROS See pertinent positives and negatives per HPI.     Objective:    BP 116/68 (BP Location: Right Arm)   Pulse 92   Temp (!) 101.1 F (38.4 C) (Temporal)   Ht 5' 3.25" (1.607 m)   Wt 141 lb (64 kg)   SpO2 95%   BMI 24.78 kg/m    Physical Exam Vitals and nursing note reviewed.  Constitutional:      General: She is not in acute distress.    Appearance: Normal appearance.  HENT:     Head: Normocephalic.     Right Ear: Tympanic membrane, ear canal and external ear normal.     Left Ear: Tympanic membrane, ear canal and external ear normal.     Nose:     Right Sinus: No maxillary sinus tenderness or frontal sinus tenderness.     Left Sinus: No maxillary sinus tenderness or frontal sinus tenderness.     Mouth/Throat:     Pharynx: Posterior oropharyngeal erythema present. No oropharyngeal exudate.  Eyes:     Conjunctiva/sclera: Conjunctivae normal.  Cardiovascular:     Rate and Rhythm: Normal rate and  regular rhythm.     Pulses: Normal pulses.     Heart sounds: Normal heart sounds.  Pulmonary:     Effort: Pulmonary effort is normal.     Breath sounds: Normal breath sounds.  Musculoskeletal:     Cervical back: Normal range of motion and neck supple. No tenderness.  Lymphadenopathy:     Cervical: No cervical adenopathy.  Skin:    General: Skin is warm.  Neurological:     General: No focal deficit present.     Mental Status: She is alert and oriented to person, place, and time.  Psychiatric:        Mood and Affect: Mood normal.        Behavior: Behavior normal.        Thought Content: Thought content normal.        Judgment: Judgment normal.     Results for orders placed or performed in visit on 08/26/22  POCT Influenza A/B  Result Value Ref Range   Influenza A, POC Positive (A) Negative   Influenza B, POC Negative Negative  POC COVID-19 BinaxNow  Result Value Ref Range   SARS Coronavirus 2 Ag Negative Negative        Assessment & Plan:   Problem List Items Addressed This Visit   None Visit Diagnoses     Influenza A    -  Primary   Treat with tamiflu 35m BID x5 days. Encourage fluids, rest. Work note given.   Relevant Medications   oseltamivir (TAMIFLU) 75 MG capsule   Fever, unspecified fever cause       Covid negative, flu A positive. See plan above. Alternate tylenol and ibuprofen every 3 hours for fever and body aches.   Relevant Orders   POCT Influenza A/B (Completed)   POC COVID-19 BinaxNow (Completed)       Meds ordered this encounter  Medications   oseltamivir (TAMIFLU) 75 MG capsule    Sig: Take 1 capsule (75 mg total) by mouth 2 (two) times daily.    Dispense:  10 capsule    Refill:  0    Return if symptoms worsen or fail to improve.  LCharyl Dancer NP

## 2022-08-26 NOTE — Patient Instructions (Signed)
It was great to see you!  Start tamiflu twice a day for 5 days.  Keep taking tylenol and ibuprofen as needed for fever and body aches.   Keep drinking plenty of fluids and get rest.  You are contagious until you haven't had a fever for 2 days  Let's follow-up if your symptoms worsen or don't improve.   Take care,  Vance Peper, NP

## 2022-08-27 ENCOUNTER — Other Ambulatory Visit: Payer: Self-pay

## 2022-08-27 ENCOUNTER — Encounter: Payer: Self-pay | Admitting: Nurse Practitioner

## 2022-08-27 DIAGNOSIS — J101 Influenza due to other identified influenza virus with other respiratory manifestations: Secondary | ICD-10-CM

## 2022-08-27 MED ORDER — OSELTAMIVIR PHOSPHATE 75 MG PO CAPS: 75.0000 mg | ORAL_CAPSULE | Freq: Two times a day (BID) | ORAL | 0 refills | Status: DC

## 2022-09-08 ENCOUNTER — Ambulatory Visit (INDEPENDENT_AMBULATORY_CARE_PROVIDER_SITE_OTHER): Payer: PRIVATE HEALTH INSURANCE | Admitting: Licensed Clinical Social Worker

## 2022-09-08 DIAGNOSIS — F331 Major depressive disorder, recurrent, moderate: Secondary | ICD-10-CM

## 2022-09-08 NOTE — Progress Notes (Signed)
   THERAPIST PROGRESS NOTE  Session Time: 8:00 am-8:55 am  Type of Therapy: Individual Therapy  Session#8  Purpose of Session: Lucy will manage mood and anxiety as evidenced by improving self esteem, challenging anxious and depressive thoughts, managing racing thoughts/overthinking, cope with loss, reducing people pleasing, and improving sleep for 5 out of 7 days for 60 days.  ProgressTowards Goals: Progressing  Interventions: Therapist utilized CBT, Solution focused brief therapy, and ACT to address anxiety and depression. Therapist provided support and empathy to patient during session. Therapist administered the PHQ9 and GAD7 to patient during session. Therapist explored patient's triggers for mood and worked with patient to identify ways to reduce overthinking and improve self esteem. Therapist reviewed patient's values and what they identified as important to them.   Effectiveness: Patient was oriented x4 (person, place, situation, and time). Patient was casually dressed, and appropriately groomed. Patient was alert, engaged, pleasant, and cooperative. Patient completed a PHQ9 with a score of 11 indicating moderate depressive symptoms present. Patient completed a GAD7 with a score of 7 indicating mild anxious symptoms present. Patient feels like she is isolating more. She has had times where she could spend time with a friend but has not had the desire to do it. Patient has been thinking about the last relationship and thinking about the things she did in the last relationship. Patient noted that she will have "good days" where she says positive things to herself in the morning and acknowledges her growth such as "I had the courage to walk away from an unhealthy situation." Patient was given 100 Affirmations and was instructed to identify a few to say to herself daily to help with the daily mindset and to navigate daily stressors including affirmations such as "The past has no power over me  anymore." Patient felt like this would be very helpful for her. Patient was reminded of her values such as "Daughters, God, Family, stability, security, emotional health, happiness on her own, and friends." She understood as she makes decisions that go toward her values she will feel more confident in her decisions.    Patient engaged in session. Patient responded well to interventions. Patient continues to meet criteria for Major depressive disorder, recurrent episode, moderate with anxious distress. Patient will continue in outpatient therapy due to being the least restrictive service to meet her needs. Patient made moderate progress on goals.   Suicidal/Homicidal: Nowithout intent/plan  Plan: Return again in 2-4 weeks.   Diagnosis: Major depressive disorder, recurrent episode, moderate with anxious distress ([place])  Collaboration of Care: Other sources will be identified.   Patient/Guardian was advised Release of Information must be obtained prior to any record release in order to collaborate their care with an outside provider. Patient/Guardian was advised if they have not already done so to contact the registration department to sign all necessary forms in order for [place] to release information regarding their care.   Consent: Patient/Guardian gives verbal consent for treatment and assignment of benefits for services provided during this visit. Patient/Guardian expressed understanding and agreed to proceed.   Glori Bickers, LCSW 09/08/2022

## 2022-09-14 ENCOUNTER — Other Ambulatory Visit: Payer: Self-pay | Admitting: Obstetrics & Gynecology

## 2022-09-14 DIAGNOSIS — Z1231 Encounter for screening mammogram for malignant neoplasm of breast: Secondary | ICD-10-CM

## 2022-09-16 ENCOUNTER — Encounter: Payer: Self-pay | Admitting: Radiology

## 2022-09-17 ENCOUNTER — Ambulatory Visit (INDEPENDENT_AMBULATORY_CARE_PROVIDER_SITE_OTHER): Payer: PRIVATE HEALTH INSURANCE | Admitting: Licensed Clinical Social Worker

## 2022-09-17 DIAGNOSIS — F331 Major depressive disorder, recurrent, moderate: Secondary | ICD-10-CM

## 2022-09-17 NOTE — Progress Notes (Signed)
   THERAPIST PROGRESS NOTE  Session Time: 8:00 am-8:55 am  Type of Therapy: Individual Therapy  Session#9  Purpose of Session: [place] will manage mood and anxiety as evidenced by improving self esteem, challenging anxious and depressive thoughts, managing racing thoughts/overthinking, cope with loss, reducing people pleasing, and improving sleep for 5 out of 7 days for 60 days.  ProgressTowards Goals: Progressing  Interventions: Therapist utilized CBT, Solution focused brief therapy, and ACT to address anxiety and depression. Therapist provided support and empathy to patient during session. Therapist administered the PHQ9 and GAD7 to patient during session. Therapist worked with patient on coping with loneliness on Sat. Therapist provided psychoeducation to patient on assertive vs aggressive vs passive communication. Therapist worked with patient on using assertive communication to express her needs or set her boundaries.   Effectiveness: Patient was oriented x4 (person, place, situation, and time). Patient was casually dressed, and appropriately groomed. Patient was alert, engaged, pleasant, and cooperative. Patient completed a PHQ9 with a score of 4 indicating minimal or no depressive symptoms present. Patient completed a GAD7 with a score of 1 indicating minimal or no anxious symptoms present. Patient's daughter went out of town. She took her daughter to the airport and then after she felt sad the following day (Sat.). Patient felt down because her daughter was gone but went out with a friend. Her friend could tell that she was sad and called her out on it. Patient noted that she was open with her friend that she was feeling down and missing having someone. Patient noted that she is working on not people pleasing. She is learning to speak up. Patient will tell someone "no" if they do something she doesn't like or that makes her feel uncomfortable but she doesn't stated clearly what they are doing  she doesn't like. Patient understood assertive vs aggressive vs passive communication. She was provided a handout with the information and additional steps to take to speak more assertive.    Patient engaged in session. Patient responded well to interventions. Patient continues to meet criteria for Major depressive disorder, recurrent episode, moderate with anxious distress. Patient will continue in outpatient therapy due to being the least restrictive service to meet her needs. Patient made moderate progress on goals.     Suicidal/Homicidal: Nowithout intent/plan  Plan: Return again in 2-4 weeks.   Diagnosis: No diagnosis found.  Collaboration of Care: Other sources will be identified.   Patient/Guardian was advised Release of Information must be obtained prior to any record release in order to collaborate their care with an outside provider. Patient/Guardian was advised if they have not already done so to contact the registration department to sign all necessary forms in order for [place] to release information regarding their care.   Consent: Patient/Guardian gives verbal consent for treatment and assignment of benefits for services provided during this visit. Patient/Guardian expressed understanding and agreed to proceed.   Glori Bickers, LCSW 09/17/2022

## 2022-09-30 ENCOUNTER — Telehealth: Payer: Self-pay

## 2022-09-30 ENCOUNTER — Other Ambulatory Visit: Payer: Self-pay

## 2022-09-30 ENCOUNTER — Other Ambulatory Visit: Payer: Self-pay | Admitting: Nurse Practitioner

## 2022-09-30 DIAGNOSIS — B009 Herpesviral infection, unspecified: Secondary | ICD-10-CM

## 2022-09-30 MED ORDER — VALACYCLOVIR HCL 500 MG PO TABS: 500.0000 mg | ORAL_TABLET | Freq: Two times a day (BID) | ORAL | 0 refills | Status: DC

## 2022-09-30 NOTE — Telephone Encounter (Signed)
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 She is experiencing another outbreak and requests RX. Pharmacy is set.

## 2022-09-30 NOTE — Telephone Encounter (Signed)
Valtrex 500 mg BID x 3-5 days as needed sent.

## 2022-10-01 ENCOUNTER — Ambulatory Visit (INDEPENDENT_AMBULATORY_CARE_PROVIDER_SITE_OTHER): Payer: PRIVATE HEALTH INSURANCE | Admitting: Licensed Clinical Social Worker

## 2022-10-01 DIAGNOSIS — F331 Major depressive disorder, recurrent, moderate: Secondary | ICD-10-CM

## 2022-10-01 MED ORDER — VALACYCLOVIR HCL 500 MG PO TABS: 500.0000 mg | ORAL_TABLET | Freq: Two times a day (BID) | ORAL | 0 refills | Status: DC

## 2022-10-01 NOTE — Progress Notes (Signed)
   THERAPIST PROGRESS NOTE  Session Time: 8:00 am-8:55 am  Type of Therapy: Individual Therapy  Session#10  Purpose of Session: Christy Haley will manage mood and anxiety as evidenced by improving self esteem, challenging anxious and depressive thoughts, managing racing thoughts/overthinking, cope with loss, reducing people pleasing, and improving sleep for 5 out of 7 days for 60 days.  ProgressTowards Goals: Progressing  Interventions: Therapist utilized CBT, Solution focused brief therapy, and ACT to address anxiety and depression. Therapist provided support and empathy to patient during session. Therapist administered the PHQ9 and GAD7 to patient during session. Therapist worked with patient on self forgiveness.  Effectiveness: Patient was oriented x4 (person, place, situation, and time). Patient was casually dressed, and appropriately groomed. Patient was alert, engaged, pleasant, and cooperative. Patient completed a PHQ9 with a score of 3 indicating minimal or no depressive symptoms present. Patient completed a GAD7 with a score of 0 indicating minimal or no anxious symptoms present. After the last session she picked her daughter up from the airport. She realized the difference from when her daughter is not home and she enjoyed the difference/time by herself. Patient has been thinking forvgiveness. Patient has forgiven her mother and has asked for her daughters forgiveness. Patient understood that part of self forgiveness is living up to her personal values. She feels like she is being the person she wants to be.    Patient engaged in session. Patient responded well to interventions. Patient continues to meet criteria for Major depressive disorder, recurrent episode, moderate with anxious distress. Patient will continue in outpatient therapy due to being the least restrictive service to meet her needs. Patient made moderate progress.     Suicidal/Homicidal: Nowithout intent/plan  Plan: Return  again in 2-4 weeks.   Diagnosis: Major depressive disorder, recurrent episode, moderate with anxious distress ([place])  Collaboration of Care: Other sources will be identified.   Patient/Guardian was advised Release of Information must be obtained prior to any record release in order to collaborate their care with an outside provider. Patient/Guardian was advised if they have not already done so to contact the registration department to sign all necessary forms in order for [place] to release information regarding their care.   Consent: Patient/Guardian gives verbal consent for treatment and assignment of benefits for services provided during this visit. Patient/Guardian expressed understanding and agreed to proceed.   Glori Bickers, LCSW 10/01/2022

## 2022-10-01 NOTE — Addendum Note (Signed)
Addended by: Judy Pimple D on: 10/01/2022 10:15 AM   Modules accepted: Orders

## 2022-10-01 NOTE — Telephone Encounter (Signed)
FYI. Pt called to report that the rx was not at the [facility].   Pt informed that rx was sent to [facility] that was on file for her.  Pt states insurance will not allow her to use [facility] any longer. [facility] and [facility] removed from pt's EMR per her request. Will send to provider for final review.

## 2022-10-12 ENCOUNTER — Ambulatory Visit (HOSPITAL_COMMUNITY): Payer: 59 | Admitting: Licensed Clinical Social Worker

## 2022-10-12 ENCOUNTER — Encounter (HOSPITAL_COMMUNITY): Payer: Self-pay

## 2022-10-25 ENCOUNTER — Ambulatory Visit: Admission: RE | Admit: 2022-10-25 | Payer: 59 | Source: Ambulatory Visit

## 2022-10-25 ENCOUNTER — Other Ambulatory Visit: Payer: Self-pay | Admitting: Nurse Practitioner

## 2022-10-25 DIAGNOSIS — Z1231 Encounter for screening mammogram for malignant neoplasm of breast: Secondary | ICD-10-CM

## 2022-10-26 ENCOUNTER — Other Ambulatory Visit (INDEPENDENT_AMBULATORY_CARE_PROVIDER_SITE_OTHER): Payer: 59

## 2022-10-26 ENCOUNTER — Encounter: Payer: Self-pay | Admitting: Physician Assistant

## 2022-10-26 ENCOUNTER — Ambulatory Visit (INDEPENDENT_AMBULATORY_CARE_PROVIDER_SITE_OTHER): Payer: 59 | Admitting: Physician Assistant

## 2022-10-26 DIAGNOSIS — R0781 Pleurodynia: Secondary | ICD-10-CM

## 2022-10-26 MED ORDER — MELOXICAM 7.5 MG PO TABS: 7.5000 mg | ORAL_TABLET | Freq: Every day | ORAL | 0 refills | Status: DC

## 2022-10-26 NOTE — Progress Notes (Signed)
Office Visit Note   Patient: Christy Haley           Date of Birth: [birth year]           MRN: [id number] Visit Date: 10/26/2022              Requested by: Gerre Scull, NP [address] [place],  [place] [address] PCP: Gerre Scull, NP   Assessment & Plan: Visit Diagnoses:  1. Rib pain     Plan: Pleasant 52 year old woman with an 8-day history of right rib pain.  This occurred after she was leaning over a washing machine to get close out and put pressure against her right ribs.  Denies any fever chills shortness of breath.  Her exam is fairly benign and she has focal tenderness over the lower right ribs.  Cannot appreciate any fractures.  She did hear a pop at that time and I think findings are more muscular.  Suggested trying a regular dose of meloxicam for the next couple weeks.  She can take Tylenol in between but no ibuprofen.  We also talked about topical Voltaren gel.  Would like for her to refrain from activities that make this worse including twisting and reaching.  She will follow-up in 2 weeks but may cancel this was appointment if she is doing well.  Was given return precautions  Follow-Up Instructions: Return in about 2 weeks (around 11/09/2022).   Orders:  Orders Placed This Encounter  Procedures   XR Ribs Unilateral Right   No orders of the defined types were placed in this encounter.     Procedures: No procedures performed   Clinical Data: No additional findings.   Subjective: Chief Complaint  Patient presents with   Rib Pain    HPI pleasant 52 year old woman with 8-day history of right lower rib pain.  This occurred after taking some close out of the washing machine that was tall.  She did lean against the ribs and felt a pop.  Describes her pain is mild to moderate.  She has been taking aspirin for relief.  Denies any fever chills or shortness of breath  Review of Systems  All other systems reviewed and are  negative.    Objective: Vital Signs: There were no vitals taken for this visit.  Physical Exam Constitutional:      Appearance: Normal appearance.  Pulmonary:     Effort: Pulmonary effort is normal.  Skin:    General: Skin is warm and dry.  Neurological:     General: No focal deficit present.     Mental Status: She is alert.     Ortho Exam Examination she appears well.  She has no dyspnea.  She has good rib expansion with deep breathing.  Has focal pain over the lower right ribs.  No ecchymosis associated with this.  She has full range of motion of her arms though when she extends her right arm it does reproduce some of the symptoms in her lower rib cage.  No other tenderness to palpation no crepitus appreciated she is neurovascular intact Specialty Comments:  No specialty comments available.  Imaging: XR Ribs Unilateral Right  Result Date: 10/26/2022 Radiographs of her right ribs and chest demonstrate no acute fractures at the area of pain.  No appreciable soft tissue abnormalities.  Vascular markings of the lungs do reach to the periphery    PMFS History: Patient Active Problem List   Diagnosis Date Noted   Dizziness 06/11/2022  Unilateral primary osteoarthritis, right knee 11/25/2021   Insomnia 11/20/2021   Recurrent major depressive disorder, in partial remission 01/12/2017   Generalized anxiety disorder 02/20/2015   IUD (intrauterine device) in place 08/12/2014   Cervical lesion 06/21/2014   Hyperglycemia 04/24/2013   Muscle tiredness 04/12/2013   Alopecia 04/12/2013   Loss of weight 04/12/2013   Hot flushes, perimenopausal 04/12/2013   Iron deficiency anemia 12/07/2012   Left lumbar radiculitis 12/07/2012   Leukopenia 01/14/2012   GERD 01/06/2012   Past Medical History:  Diagnosis Date   Anxiety    Depression    GERD (gastroesophageal reflux disease)     Family History  Problem Relation Age of Onset   Arthritis Mother    Hypertension Mother     Diabetes Maternal Grandmother     Past Surgical History:  Procedure Laterality Date   AUGMENTATION MAMMAPLASTY  10/09/2015   breast lift and implants    INTRAUTERINE DEVICE INSERTION  2010   Mirena   KNEE SURGERY Right    PELVIC LAPAROSCOPY     ovarian cystectomy   WRIST SURGERY Left 06/21/2019   plate/    Social History   Occupational History   Occupation: Psychologist, clinical: UNEMPLOYED  Tobacco Use   Smoking status: Never   Smokeless tobacco: Never  Vaping Use   Vaping Use: Never used  Substance and Sexual Activity   Alcohol use: Not Currently    Comment: occ   Drug use: No   Sexual activity: Not Currently    Partners: Male    Birth control/protection: I.U.D.    Comment: Mirena IUD inserted 11-06-19

## 2022-10-28 ENCOUNTER — Encounter: Payer: Self-pay | Admitting: Nurse Practitioner

## 2022-10-28 ENCOUNTER — Ambulatory Visit (INDEPENDENT_AMBULATORY_CARE_PROVIDER_SITE_OTHER): Payer: PRIVATE HEALTH INSURANCE | Admitting: Licensed Clinical Social Worker

## 2022-10-28 ENCOUNTER — Ambulatory Visit (INDEPENDENT_AMBULATORY_CARE_PROVIDER_SITE_OTHER): Payer: 59 | Admitting: Nurse Practitioner

## 2022-10-28 ENCOUNTER — Other Ambulatory Visit: Payer: Self-pay | Admitting: Nurse Practitioner

## 2022-10-28 VITALS — BP 102/64 | HR 61 | Temp 97.6°F | Ht 63.25 in | Wt 140.8 lb

## 2022-10-28 DIAGNOSIS — B009 Herpesviral infection, unspecified: Secondary | ICD-10-CM

## 2022-10-28 DIAGNOSIS — F331 Major depressive disorder, recurrent, moderate: Secondary | ICD-10-CM | POA: Diagnosis not present

## 2022-10-28 DIAGNOSIS — F411 Generalized anxiety disorder: Secondary | ICD-10-CM

## 2022-10-28 DIAGNOSIS — F3341 Major depressive disorder, recurrent, in partial remission: Secondary | ICD-10-CM | POA: Diagnosis not present

## 2022-10-28 DIAGNOSIS — Z23 Encounter for immunization: Secondary | ICD-10-CM

## 2022-10-28 MED ORDER — BUPROPION HCL ER (XL) 300 MG PO TB24: 300.0000 mg | ORAL_TABLET | Freq: Every day | ORAL | 1 refills | Status: DC

## 2022-10-28 NOTE — Telephone Encounter (Signed)
Med refill request: valtrex 500 mg tab. Take 1 tab PO BID PRN.  Last AEX: 11/19/21 -ML Next AEX: 11/25/22 -ML Last MMG (if hormonal med) N/A  Hx HSV  Refill authorized: Please Advise?

## 2022-10-28 NOTE — Progress Notes (Signed)
Established Patient Office Visit  Subjective   Patient ID: Christy Haley, female    DOB: 07/11/1971  Age: 52 y.o. MRN: [id number]  Chief Complaint  Patient presents with   Medication Management    Refill of Wellbutrin     HPI  Christy Haley is here to follow-up on depression.   She states that she has been going to a therapist for the last 9 months and is taking Wellbutrin XL  daily. She is doing well and would like to consider decreasing the medication in the future. She denies SI/HI.      10/28/2022    4:42 PM 08/26/2022    4:50 PM 06/10/2022    3:47 PM 02/02/2022   10:33 AM 01/26/2022    3:26 PM  Depression screen PHQ 2/9  Decreased Interest 0 Down, Depressed, Hopeless PHQ - 2 Score Altered sleeping 0 1  1   Tired, decreased energy Change in appetite 1 1  0   Feeling bad or failure about yourself  Trouble concentrating 0 1  1   Moving slowly or fidgety/restless 0 1  0   Suicidal thoughts 0 0  0   PHQ-9 Score Difficult doing work/chores Somewhat difficult Somewhat difficult  Somewhat difficult      Information is confidential and restricted. Go to Review Flowsheets to unlock data.      10/28/2022    4:42 PM 08/26/2022    4:50 PM 02/02/2022   10:33 AM 11/20/2021    4:18 PM  GAD 7 : Generalized Anxiety Score  Nervous, Anxious, on Edge Control/stop worrying Worry too much - different things Trouble relaxing 0 Restless 0 Easily annoyed or irritable 0 Afraid - awful might happen 0 0 0 0  Total GAD 7 Score Anxiety Difficulty Somewhat difficult Somewhat difficult Somewhat difficult Somewhat difficult    ROS See pertinent positives and negatives per HPI.    Objective:     BP 102/64 (BP Location: Left Arm)   Pulse 61   Temp 97.6 F (36.4 C)   Ht 5' 3.25" (1.607 m)   Wt 140 lb 12.8 oz (63.9 kg)   SpO2 98%   BMI 24.74 kg/m      Physical Exam Vitals and nursing note reviewed.  Constitutional:      General: She is not in acute distress.    Appearance: Normal appearance.  HENT:     Head: Normocephalic.  Eyes:     Conjunctiva/sclera: Conjunctivae normal.  Cardiovascular:     Rate and Rhythm: Normal rate and regular rhythm.     Pulses: Normal pulses.     Heart sounds: Normal heart sounds.  Pulmonary:     Effort: Pulmonary effort is normal.     Breath sounds: Normal breath sounds.  Musculoskeletal:     Cervical back: Normal range of motion.  Skin:    General: Skin is warm.  Neurological:     General: No focal deficit present.     Mental Status: She is alert and oriented to person, place, and time.  Psychiatric:        Mood and Affect: Mood  normal.        Behavior: Behavior normal.        Thought Content: Thought content normal.        Judgment: Judgment normal.      Assessment & Plan:   Problem List Items Addressed This Visit       Other   Generalized anxiety disorder    Chronic, stable. Continue Wellbutrin XL  daily along with have her continue therapy sessions. Follow-up in 6 months.       Relevant Medications   buPROPion (WELLBUTRIN XL) 300 MG 24 hr tablet   Recurrent major depressive disorder, in partial remission - Primary    Chronic, stable. Continue Wellbutrin XL  daily along with have her continue therapy sessions. Follow-up in 6 months.       Relevant Medications   buPROPion (WELLBUTRIN XL) 300 MG 24 hr tablet   Other Visit Diagnoses     Immunization due       Shingrix #1 given today   Relevant Orders   Varicella-zoster vaccine IM (Completed)       Return in about 4 months (around 02/27/2023) for CPE.    Gerre Scull, NP

## 2022-10-28 NOTE — Patient Instructions (Signed)
It was great to see you!  I have refilled your wellbutrin.   Let's follow-up in 4-5 months, sooner if you have concerns.  If a referral was placed today, you will be contacted for an appointment. Please note that routine referrals can sometimes take up to 3-4 weeks to process. Please call our office if you haven't heard anything after this time frame.  Take care,  Rodman Pickle, NP
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
## 2022-10-29 ENCOUNTER — Encounter: Payer: Self-pay | Admitting: Nurse Practitioner

## 2022-10-29 NOTE — Assessment & Plan Note (Signed)
Chronic, stable. Continue Wellbutrin XL  daily along with have her continue therapy sessions. Follow-up in 6 months.

## 2022-10-29 NOTE — Assessment & Plan Note (Signed)
Chronic, stable. Continue Wellbutrin XL 300mg daily along with have her continue therapy sessions. Follow-up in 6 months.  

## 2022-11-09 ENCOUNTER — Ambulatory Visit: Payer: 59 | Admitting: Physician Assistant

## 2022-11-11 ENCOUNTER — Ambulatory Visit (HOSPITAL_COMMUNITY): Payer: 59 | Admitting: Licensed Clinical Social Worker

## 2022-11-23 ENCOUNTER — Ambulatory Visit (INDEPENDENT_AMBULATORY_CARE_PROVIDER_SITE_OTHER): Payer: PRIVATE HEALTH INSURANCE | Admitting: Licensed Clinical Social Worker

## 2022-11-23 DIAGNOSIS — F331 Major depressive disorder, recurrent, moderate: Secondary | ICD-10-CM
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
## 2022-11-25 ENCOUNTER — Encounter: Payer: Self-pay | Admitting: Obstetrics & Gynecology

## 2022-11-25 ENCOUNTER — Other Ambulatory Visit (HOSPITAL_COMMUNITY)
Admission: RE | Admit: 2022-11-25 | Discharge: 2022-11-25 | Disposition: A | Discharge status: Home / Self Care | Payer: 59 | Source: Ambulatory Visit | Attending: Obstetrics & Gynecology | Admitting: Obstetrics & Gynecology

## 2022-11-25 ENCOUNTER — Ambulatory Visit (INDEPENDENT_AMBULATORY_CARE_PROVIDER_SITE_OTHER): Payer: 59 | Admitting: Obstetrics & Gynecology

## 2022-11-25 VITALS — BP 110/76 | HR 66 | Ht 61.75 in | Wt 142.0 lb

## 2022-11-25 DIAGNOSIS — Z01419 Encounter for gynecological examination (general) (routine) without abnormal findings: Secondary | ICD-10-CM

## 2022-11-25 DIAGNOSIS — Z30431 Encounter for routine checking of intrauterine contraceptive device: Secondary | ICD-10-CM | POA: Diagnosis not present

## 2022-11-25 DIAGNOSIS — B009 Herpesviral infection, unspecified: Secondary | ICD-10-CM

## 2022-11-25 MED ORDER — VALACYCLOVIR HCL 500 MG PO TABS: 500.0000 mg | ORAL_TABLET | Freq: Every day | ORAL | 4 refills | Status: DC

## 2022-11-25 NOTE — Progress Notes (Signed)
Christy Haley 03-Dec-1970 [id number]   History:    52 y.o. G3P2A1L2 Divorced.  Daughters 75 and 54 yo.   RP:  Established patient presenting for annual gyn exam    HPI: Well on Mirena IUD x 10/2019.  No BTB.  No pelvic pain.  Abstinent.  Last Pap negative 11/2021.  HPV HR Neg 2020.  HPV 16-18-45 negative.  Colpo 10/2017 No dysplasia.  Pap reflex today. Had a genital HSV primo infection in 05/2022.  On Valacyclovir prophylaxis.  2 recurrences since 05/2022.  Urine/BMs Normal. Breasts normal, bilateral augmentation with recent revision because of encapsulation. Mammo 06/2021 Negative.  Mammo scheduled 11/29/22.  BMI 26.18.  Good fitness and Healthy nutrition.  No depressive Sx on Wellbutrin.  Followed by Psychotherapist.  Health labs with Fam MD.   Past medical history,surgical history, family history and social history were all reviewed and documented in the EPIC chart.  Gynecologic History No LMP recorded. (Menstrual status: IUD).  Obstetric History OB History  Gravida Para Term Preterm AB Living  3 2 0   1 2  SAB IAB Ectopic Multiple Live Births  1       2    # Outcome Date GA Lbr Len/2nd Weight Sex Delivery Anes PTL Lv  3 SAB           2 Para     F Vag-Spont     1 Para     F Vag-Spont        ROS: A ROS was performed and pertinent positives and negatives are included in the history. GENERAL: No fevers or chills. HEENT: No change in vision, no earache, sore throat or sinus congestion. NECK: No pain or stiffness. CARDIOVASCULAR: No chest pain or pressure. No palpitations. PULMONARY: No shortness of breath, cough or wheeze. GASTROINTESTINAL: No abdominal pain, nausea, vomiting or diarrhea, melena or bright red blood per rectum. GENITOURINARY: No urinary frequency, urgency, hesitancy or dysuria. MUSCULOSKELETAL: No joint or muscle pain, no back pain, no recent trauma. DERMATOLOGIC: No rash, no itching, no lesions. ENDOCRINE: No polyuria, polydipsia, no heat or cold intolerance. No recent  change in weight. HEMATOLOGICAL: No anemia or easy bruising or bleeding. NEUROLOGIC: No headache, seizures, numbness, tingling or weakness. PSYCHIATRIC: No depression, no loss of interest in normal activity or change in sleep pattern.     Exam:   BP 110/76   Pulse 66   Ht 5' 1.75" (1.568 m)   Wt 142 lb (64.4 kg)   SpO2 99%   BMI 26.18 kg/m   Body mass index is 26.18 kg/m.  General appearance : Well developed well nourished female. No acute distress HEENT: Eyes: no retinal hemorrhage or exudates,  Neck supple, trachea midline, no carotid bruits, no thyroidmegaly Lungs: Clear to auscultation, no rhonchi or wheezes, or rib retractions  Heart: Regular rate and rhythm, no murmurs or gallops Breast:Examined in sitting and supine position were symmetrical in appearance, no palpable masses or tenderness,  no skin retraction, no nipple inversion, no nipple discharge, no skin discoloration, no axillary or supraclavicular lymphadenopathy Abdomen: no palpable masses or tenderness, no rebound or guarding Extremities: no edema or skin discoloration or tenderness  Pelvic: Vulva: Normal             Vagina: No gross lesions or discharge  Cervix: No gross lesions or discharge.  [facility]UD strings visible at [facility].  Pap reflex done.  Uterus  AV, normal size, shape and consistency, non-tender and mobile  Adnexa  Without masses or tenderness  Anus: Normal   Assessment/Plan:  52 y.o. female for annual exam   1. Encounter for routine gynecological examination with Papanicolaou smear of cervix Well on Mirena IUD x 10/2019.  No BTB.  No pelvic pain.  Abstinent. Last Pap negative 11/2021.  HPV HR Neg 2020.  HPV 16-18-45 negative.  Colpo 10/2017 No dysplasia.  Pap reflex today. Had a genital HSV primo infection in 05/2022.  On Valacyclovir prophylaxis.  2 recurrences since 05/2022.  Urine/BMs Normal. Breasts normal, bilateral augmentation with recent revision because of encapsulation. Mammo 06/2021 Negative.  Mammo  scheduled 11/29/22.  BMI 26.18.  Good fitness and Healthy nutrition.  No depressive Sx on Wellbutrin.  Followed by Psychotherapist.  Health labs with Fam MD. - Cytology - PAP( [place])  2. Encounter for routine checking of intrauterine contraceptive device (IUD) Well on Mirena IUD x 10/2019.  No BTB.  No pelvic pain.  Abstinent. IUD in good position.  3. HSV (herpes simplex virus) infection Valacyclovir prophylaxis recommended.  Will double the dose x 5 days for recurrences. - valACYclovir (VALTREX) 500 MG tablet; Take 1 tablet (500 mg total) by mouth daily. Take 1 tab daily for prophylaxis   Genia Del MD, 8:21 AM

## 2022-12-01 LAB — CYTOLOGY - PAP: Diagnosis: NEGATIVE

## 2022-12-09 ENCOUNTER — Ambulatory Visit (INDEPENDENT_AMBULATORY_CARE_PROVIDER_SITE_OTHER): Payer: PRIVATE HEALTH INSURANCE | Admitting: Licensed Clinical Social Worker

## 2022-12-09 DIAGNOSIS — F331 Major depressive disorder, recurrent, moderate: Secondary | ICD-10-CM | POA: Diagnosis not present

## 2022-12-09 NOTE — Progress Notes (Signed)
   THERAPIST PROGRESS NOTE  Session Time: 8:00 am-8:45 am  Type of Therapy: Individual Therapy  Session#13  Purpose of Session: Akili will manage mood and anxiety as evidenced by improving self esteem, challenging anxious and depressive thoughts, managing racing thoughts/overthinking, cope with loss, reducing people pleasing, and improving sleep for 5 out of 7 days for 60 days.  ProgressTowards Goals: Progressing  Interventions: Therapist utilized CBT, Solution focused brief therapy, and ACT to address anxiety and depression. Therapist provided support and empathy to patient during session. Therapist administered the PHQ9 and GAD7 to patient during session. Therapist processed patient's feelings to identify triggers for anxiety and worked with patient on how to talk to her daughter/reduce people pleasing.   Effectiveness: Patient was oriented x4 (person, place, situation, and time). Patient was casually dressed, and appropriately groomed. Patient was alert, engaged, pleasant, and cooperative. Patient completed a PHQ9 with a score of 7 indicating mild depressive symptoms present. Patient completed a GAD7 with a score of 5 indicating mild anxious symptoms present. Patient noted that her daughter graduated college. She encouraged her daughter to invite her father to the graduation. She was nervous about him being there at first but she focused on her daughters graduation rather than him. She felt like she was able to have conversations with her ex and didn't let him change her experience with the graduation. Patient shared that her other daughter has been married for 5 years and recently moved back home due to her husband leaving her. He is going back to the country he moved from due to his parents being "sick." He didn't offer to take her with him, etc. Patient noted that the last time her daughter lived with her she was messy and it raised her anxiety level. She worries this will happen again. Patient  wants to have a conversation with her daughter and express that she wants her to live with her but also she needs to clean up after herself. Patient knows how it feels to go through a divorce but also wants to maintain her peace. Patient practiced what she want to say in session and she is going to make notes to remind herself of what she wants to say. She is not going to say anything this week but she is not going to wait more than two weeks.     Patient engaged in session. Patient responded well to interventions. Patient continues to meet criteria for Major depressive disorder, recurrent episode, moderate with anxious distress. Patient will continue in outpatient therapy due to being the least restrictive service to meet her needs. Patient made moderate progress    Suicidal/Homicidal: Nowithout intent/plan  Plan: Return again in 2-4 weeks.   Diagnosis: Major depressive disorder, recurrent episode, moderate with anxious distress (HCC)  Collaboration of Care: Other sources will be identified.   Patient/Guardian was advised Release of Information must be obtained prior to any record release in order to collaborate their care with an outside provider. Patient/Guardian was advised if they have not already done so to contact the registration department to sign all necessary forms in order for [place] to release information regarding their care.   Consent: Patient/Guardian gives verbal consent for treatment and assignment of benefits for services provided during this visit. Patient/Guardian expressed understanding and agreed to proceed.   Bynum Bellows, LCSW 12/09/2022

## 2022-12-16 ENCOUNTER — Ambulatory Visit
Admission: RE | Admit: 2022-12-16 | Discharge: 2022-12-16 | Disposition: A | Discharge status: Home / Self Care | Payer: 59 | Source: Ambulatory Visit | Attending: Nurse Practitioner | Admitting: Nurse Practitioner

## 2022-12-16 DIAGNOSIS — Z1231 Encounter for screening mammogram for malignant neoplasm of breast: Secondary | ICD-10-CM | POA: Insufficient documentation

## 2022-12-22 ENCOUNTER — Telehealth: Payer: Self-pay | Admitting: Nurse Practitioner

## 2022-12-22 NOTE — Telephone Encounter (Signed)
Pt called and said you asked her to give you a call. Please give the pt a call back

## 2022-12-22 NOTE — Telephone Encounter (Signed)
I return patient's call

## 2022-12-23 ENCOUNTER — Ambulatory Visit (INDEPENDENT_AMBULATORY_CARE_PROVIDER_SITE_OTHER): Payer: PRIVATE HEALTH INSURANCE | Admitting: Licensed Clinical Social Worker

## 2022-12-23 DIAGNOSIS — F331 Major depressive disorder, recurrent, moderate: Secondary | ICD-10-CM | POA: Diagnosis not present

## 2022-12-23 NOTE — Progress Notes (Signed)
   THERAPIST PROGRESS NOTE  Session Time: 8:00 am-8:45 am  Type of Therapy: Individual Therapy  Session#14  Purpose of Session: Christy Haley will manage mood and anxiety as evidenced by improving self esteem, challenging anxious and depressive thoughts, managing racing thoughts/overthinking, cope with loss, reducing people pleasing, and improving sleep for 5 out of 7 days for 60 days.  ProgressTowards Goals: Progressing  Interventions: Therapist utilized CBT, Solution focused brief therapy, and ACT to address anxiety and depression. Therapist provided support and empathy to patient during session. Therapist administered the PHQ9 and GAD7 to patient during session. Therapist processed patient's feelings. Therapist worked with patient to identify factors that impact sleep  Effectiveness: Patient was oriented x4 (person, place, situation, and time). Patient was casually dressed, and appropriately groomed. Patient was alert, engaged, pleasant, and cooperative. Patient completed a PHQ9 with a score of 4 indicating minimal or no depressive symptoms present. Patient completed a GAD7 with a score of 5 indicating mild anxious symptoms present. Patient spoke to her daughter about cleaning up and helping around the house. They had a good conversation, and her daughter has been trying more. Patient noted that she has not been sleeping well, has had difficulty with memory, and less energy. Patient has been sleeping in her daughter's room and her daughters have been sharing patient's room. Patient noted that the bed is comfortable, and it is soft but it is not her room. She has been waking up frequently. Patient gets worried about not sleeping well which in turns makes her not sleep well. Patient is going to try to "control" the bedroom environment to improve sleep. Patient feels like she doesn't have the energy to do things she enjoys like go for a walk. Patient has not been cooking due to her daughters being at home and  cooking more. She wants to get back to cooking more.     Patient engaged in session. Patient responded well to interventions. Patient continues to meet criteria for Major depressive disorder, recurrent episode, moderate with anxious distress. Patient will continue in outpatient therapy due to being the least restrictive service to meet her needs. Patient made moderate progress    Suicidal/Homicidal: Nowithout intent/plan  Plan: Return again in 2-4 weeks.   Diagnosis: Major depressive disorder, recurrent episode, moderate with anxious distress (HCC)  Collaboration of Care: Other sources will be identified.   Patient/Guardian was advised Release of Information must be obtained prior to any record release in order to collaborate their care with an outside provider. Patient/Guardian was advised if they have not already done so to contact the registration department to sign all necessary forms in order for [place] to release information regarding their care.   Consent: Patient/Guardian gives verbal consent for treatment and assignment of benefits for services provided during this visit. Patient/Guardian expressed understanding and agreed to proceed.   Bynum Bellows, LCSW 12/23/2022

## 2023-01-03 ENCOUNTER — Encounter: Payer: Self-pay | Admitting: Nurse Practitioner

## 2023-01-03 DIAGNOSIS — Z1211 Encounter for screening for malignant neoplasm of colon: Secondary | ICD-10-CM

## 2023-01-06 ENCOUNTER — Ambulatory Visit (INDEPENDENT_AMBULATORY_CARE_PROVIDER_SITE_OTHER): Payer: PRIVATE HEALTH INSURANCE | Admitting: Licensed Clinical Social Worker

## 2023-01-06 DIAGNOSIS — F331 Major depressive disorder, recurrent, moderate: Secondary | ICD-10-CM | POA: Diagnosis not present

## 2023-01-06 NOTE — Progress Notes (Signed)
   THERAPIST PROGRESS NOTE  Session Time: 8:05 am-8:51 am  Type of Therapy: Individual Therapy  Session#15  Purpose of Session: Venus will manage mood and anxiety as evidenced by improving self esteem, challenging anxious and depressive thoughts, managing racing thoughts/overthinking, cope with loss, reducing people pleasing, and improving sleep for 5 out of 7 days for 60 days.  ProgressTowards Goals: Progressing  Interventions: Therapist utilized CBT, Solution focused brief therapy, and ACT to address anxiety and depression. Therapist provided support and empathy to patient during session. Therapist administered the PHQ9 and GAD7 to patient during session. Therapist worked with patient on processing her feelings to identify triggers for mood and/or anxiety. Therapist worked with patient on how she challenged her depressive thoughts.    Effectiveness: Patient was oriented x4 (person, place, situation, and time). Patient was casually dressed, and appropriately groomed. Patient was alert, engaged, pleasant, and cooperative. Patient completed a PHQ9 with a score of 5 indicating mild depressive symptoms present. Patient completed a GAD7 with a score of 4 indicating minimal or no anxious symptoms present. Patient's sleep has improved but she is still feeling tired and not as energized. Patient has not been attending church due to feeling tired and helping her daughter clean out her apartment. Patient got a call from a member of her church who commented on her not attending church and then told her that she needs to change her attitude, or she won't get a husband. Patient has been serving at church and was only attending when she had to serve. This is what her friend commented on. Patient knows that she is introverted and doesn't talk as much as others or share as much but she is not rude. She was hurt and frustrated by these comments from her friend at church but chose to not let them negatively impact her  because she doesn't see them as a true representation of her. Patient has changed her thought process and sticking to her values.     Patient engaged in session. Patient responded well to interventions. Patient continues to meet criteria for Major depressive disorder, recurrent episode, moderate with anxious distress. Patient will continue in outpatient therapy due to being the least restrictive service to meet her needs. Patient made moderate progress    Suicidal/Homicidal: Nowithout intent/plan  Plan: Return again in 2-4 weeks.   Diagnosis: Major depressive disorder, recurrent episode, moderate with anxious distress (HCC)  Collaboration of Care: Other sources will be identified.   Patient/Guardian was advised Release of Information must be obtained prior to any record release in order to collaborate their care with an outside provider. Patient/Guardian was advised if they have not already done so to contact the registration department to sign all necessary forms in order for [place] to release information regarding their care.   Consent: Patient/Guardian gives verbal consent for treatment and assignment of benefits for services provided during this visit. Patient/Guardian expressed understanding and agreed to proceed.   Bynum Bellows, LCSW 01/06/2023

## 2023-01-20 ENCOUNTER — Ambulatory Visit (INDEPENDENT_AMBULATORY_CARE_PROVIDER_SITE_OTHER): Payer: PRIVATE HEALTH INSURANCE | Admitting: Licensed Clinical Social Worker

## 2023-01-20 DIAGNOSIS — F331 Major depressive disorder, recurrent, moderate: Secondary | ICD-10-CM

## 2023-01-20 NOTE — Progress Notes (Signed)
THERAPIST PROGRESS NOTE  Session Time: 8:00 am-8:50 am  Type of Therapy: Individual Therapy  Session#16  Purpose of Session: Elky will manage mood and anxiety as evidenced by improving self esteem, challenging anxious and depressive thoughts, managing racing thoughts/overthinking, cope with loss, reducing people pleasing, and improving sleep for 5 out of 7 days for 60 days.  ProgressTowards Goals: Progressing  Interventions: Therapist utilized CBT, Solution focused brief therapy, and ACT to address anxiety and depression. Therapist provided support and empathy to patient during session. Therapist administered the PHQ9 and GAD7 to patient during session. Therapist worked with patient on anxious thoughts, and reducing people pleasing.    Effectiveness: Patient was oriented x4 (person, place, situation, and time). Patient was casually dressed, and appropriately groomed. Patient was alert, engaged, pleasant, and cooperative. Patient completed a PHQ9 with a score of 5 indicating mild depressive symptoms present. Patient completed a GAD7 with a score of 3 indicating minimal or no anxious symptoms present. Patient noted that things have been up and down since last session. She is still going to the gym still. Patient is working on helping her daughter clean out her apartment. She is doing this on Sunday which is normally her day off. 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 Patient noted the husband in the home asked her about a mark on their antique table. She didn't notice it the last time she was there. She noted he continued to repeat/ask about the spot and she said the same thing as before but after a couple of times got a tone. He noted that he wasn't accusing her of making the mark. Patient felt bad about getting a tone but also was annoyed that he kept saying the same thing over and over again. She noted the last time she was in the home there  was a Insurance underwriter and the cat was roaming free. She thought maybe the cat got on the table and the home owner said the cat never gets on the table. 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 She wonders if the Insurance underwriter was question. Patient was able to identify a situation where she was able to not respond to a situation that upset her. She went to a sip and paint with a friend. There was a guy that they knew there who was older but never married and her friend asked him why he was never married. They guy started to say some negative generalizations about women and patient was getting frustrated. This time she chose to smile and not respond because it would have been negative. She also recognized this was his opinion not the true and not a reflection on all women or herself. She started to personalize but stopped herself.       Patient engaged in session. Patient responded well to interventions. Patient continues to meet criteria for Major depressive disorder, recurrent episode, moderate with anxious distress. Patient will continue in outpatient therapy due to being the least restrictive service to meet her needs. Patient made moderate progress  Suicidal/Homicidal: Nowithout intent/plan  Plan: Return again in 2-4 weeks.   Diagnosis: Major depressive disorder, recurrent episode, moderate with anxious distress (HCC)  Collaboration of Care: Other sources will be identified.   Patient/Guardian was advised Release of Information must be obtained prior to any record release in order to collaborate their care with  an outside provider. Patient/Guardian was advised if they have not already done so to contact the registration department to sign all necessary forms in order for [place] to release information regarding their care.   Consent: Patient/Guardian gives verbal consent for treatment and assignment of benefits for services provided during this visit.  Patient/Guardian expressed understanding and agreed to proceed.   Bynum Bellows, LCSW 01/20/2023

## 2023-02-09 ENCOUNTER — Ambulatory Visit (INDEPENDENT_AMBULATORY_CARE_PROVIDER_SITE_OTHER): Payer: PRIVATE HEALTH INSURANCE | Admitting: Licensed Clinical Social Worker

## 2023-02-09 DIAGNOSIS — F331 Major depressive disorder, recurrent, moderate: Secondary | ICD-10-CM | POA: Diagnosis not present

## 2023-02-09 NOTE — Progress Notes (Signed)
   THERAPIST PROGRESS NOTE  Session Time: 8:00 am-8:45 am  Type of Therapy: Individual Therapy  Session#17  Purpose of Session: Christy Haley will manage mood and anxiety as evidenced by improving self esteem, challenging anxious and depressive thoughts, managing racing thoughts/overthinking, cope with loss, reducing people pleasing, and improving sleep for 5 out of 7 days for 60 days.  ProgressTowards Goals: Progressing  Interventions: Therapist utilized CBT, Solution focused brief therapy, and ACT to address anxiety and depression. Therapist provided support and empathy to patient during session. Therapist administered the PHQ9 and GAD7 to patient during session. Therapist worked with patient on racing thoughts and overthinking that leads to defensiveness.    Effectiveness: Patient was oriented x4 (person, place, situation, and time). Patient was casually dressed, and appropriately groomed. Patient was alert, engaged, pleasant, and cooperative. Patient completed a PHQ9 with a score of 5 indicating mild depressive symptoms present. Patient completed a GAD7 with a score of 4 indicating minimal or no anxious symptoms present.  Patient noted that she has been tired and not sleeping well. Patient had one situation where she got "heated" with another person but didn't get a tone or rude. It was a discussion of religion and education system. Patient had a friend blaming everything on the education system and patient was saying that things start in the home. Patient is working on her responses. She also went to a baby shower where her sister was present but she didn't speak to her sister. On one hand she was happy she went, and on the other hand she was sad that her ego stopped her from speaking to her sister. She also recognizes she was trying to protect her peace. She doesn't want the relationship to be strained and she is working on getting better with her responses, etc so that she doesn't get pulled into an  argument with his sister every time after her sister says something negative. Patient is still working on her internal narrative that can think negative about herself or get defensive with others.       Patient engaged in session. Patient responded well to interventions. Patient continues to meet criteria for Major depressive disorder, recurrent episode, moderate with anxious distress. Patient will continue in outpatient therapy due to being the least restrictive service to meet her needs. Patient made moderate progress  Suicidal/Homicidal: Nowithout intent/plan  Plan: Return again in 2-4 weeks.   Diagnosis: Major depressive disorder, recurrent episode, moderate with anxious distress (HCC)  Collaboration of Care: Other sources will be identified.   Patient/Guardian was advised Release of Information must be obtained prior to any record release in order to collaborate their care with an outside provider. Patient/Guardian was advised if they have not already done so to contact the registration department to sign all necessary forms in order for [place] to release information regarding their care.   Consent: Patient/Guardian gives verbal consent for treatment and assignment of benefits for services provided during this visit. Patient/Guardian expressed understanding and agreed to proceed.   Bynum Bellows, LCSW 02/09/2023

## 2023-02-24 ENCOUNTER — Ambulatory Visit (INDEPENDENT_AMBULATORY_CARE_PROVIDER_SITE_OTHER): Payer: PRIVATE HEALTH INSURANCE | Admitting: Licensed Clinical Social Worker

## 2023-02-24 DIAGNOSIS — F331 Major depressive disorder, recurrent, moderate: Secondary | ICD-10-CM | POA: Diagnosis not present

## 2023-02-24 NOTE — Progress Notes (Signed)
   THERAPIST PROGRESS NOTE  Session Time: 8:00 am-8:45 am  Type of Therapy: Individual Therapy  Session#18  Purpose of Session: Christy Haley will manage mood and anxiety as evidenced by improving self esteem, challenging anxious and depressive thoughts, managing racing thoughts/overthinking, cope with loss, reducing people pleasing, and improving sleep for 5 out of 7 days for 60 days.  ProgressTowards Goals: Progressing  Interventions: Therapist utilized CBT, Solution focused brief therapy, and ACT to address anxiety and depression. Therapist provided support and empathy to patient during session. Therapist administered the PHQ9 and GAD7 to patient during session. Therapist had patient identify what has gone well since the last session related to her self esteem and thoughts.    Effectiveness: Patient was oriented x4 (person, place, situation, and time). Patient was casually dressed, and appropriately groomed. Patient was alert, engaged, pleasant, and cooperative. Patient completed a PHQ9 with a score of 4 indicating minimal or no depressive symptoms present. Patient completed a GAD7 with a score of 6 indicating mild anxious symptoms present.  Patient noted that her mood has been more stable but her anxiety has been slightly increased. Patient was invited to a birthday party for her niece but she has decided not to go. Her sister has invited her ex husbands and his family to the party. She is not ready at this point to go to face her ex but plans going to a family gathering in the future. Patient has been working on herself. She has been thinking about how she views herself. She feels like she is more confident than before and views herself in a better light than before. She feels like she is taking care of herself more than before. She noted that her daughters have not been cleaning up as much around the home. Patient is trying to be respectful and support but not take on her daughters problems.     Patient engaged in session. Patient responded well to interventions. Patient continues to meet criteria for Major depressive disorder, recurrent episode, moderate with anxious distress. Patient will continue in outpatient therapy due to being the least restrictive service to meet her needs. Patient made moderate progress  Suicidal/Homicidal: Nowithout intent/plan  Plan: Return again in 2-4 weeks.   Diagnosis: Major depressive disorder, recurrent episode, moderate with anxious distress (HCC)  Collaboration of Care: Other sources will be identified.   Patient/Guardian was advised Release of Information must be obtained prior to any record release in order to collaborate their care with an outside provider. Patient/Guardian was advised if they have not already done so to contact the registration department to sign all necessary forms in order for [place] to release information regarding their care.   Consent: Patient/Guardian gives verbal consent for treatment and assignment of benefits for services provided during this visit. Patient/Guardian expressed understanding and agreed to proceed.   Christy Bellows, LCSW 02/24/2023

## 2023-03-10 ENCOUNTER — Ambulatory Visit (INDEPENDENT_AMBULATORY_CARE_PROVIDER_SITE_OTHER): Payer: PRIVATE HEALTH INSURANCE | Admitting: Licensed Clinical Social Worker

## 2023-03-10 DIAGNOSIS — F331 Major depressive disorder, recurrent, moderate: Secondary | ICD-10-CM

## 2023-03-10 NOTE — Progress Notes (Signed)
   THERAPIST PROGRESS NOTE  Session Time: 8:00 am-8:51 am  Type of Therapy: Individual Therapy  Session#19  Purpose of Session: Christy Haley will manage mood and anxiety as evidenced by improving self esteem, challenging anxious and depressive thoughts, managing racing thoughts/overthinking, cope with loss, reducing people pleasing, and improving sleep for 5 out of 7 days for 60 days.  ProgressTowards Goals: Progressing  Interventions: Therapist utilized CBT, Solution focused brief therapy, and ACT to address anxiety and depression. Therapist provided support and empathy to patient during session. Therapist administered the PHQ9 and GAD7 to patient during session. Therapist worked with patient on her relational interactions including being unapproachable and projecting her views on others.    Effectiveness: Patient was oriented x4 (person, place, situation, and time). Patient was casually dressed, and appropriately groomed. Patient was alert, engaged, pleasant, and cooperative. Patient completed a PHQ9 with a score of 4 indicating minimal or no depressive symptoms present. Patient completed a GAD7 with a score of 4 indicating mild anxious symptoms present. Patient noted that she has had anxiety after talking to her sister. Patient realized that she made comments or assumptions toward her sister. She was fearful that she was upsetting her sister and she could lose the friendship. Patient has recognized that she has not been open to others and interacting with them which she feels like people didn't approach her. Patient is trying to work on this. Patient was provided information about emotional bids from Christy Haley and how people small gestures: eye contact, smiles, etc can be bids for emotional connection. Patient is going to work on being more open to interactions with others and not project her thoughts/feelings on others.    Patient engaged in session. Patient responded well to interventions. Patient  continues to meet criteria for Major depressive disorder, recurrent episode, moderate with anxious distress. Patient will continue in outpatient therapy due to being the least restrictive service to meet her needs. Patient made moderate progress.  Suicidal/Homicidal: Nowithout intent/plan  Plan: Return again in 2-4 weeks.   Diagnosis: Major depressive disorder, recurrent episode, moderate with anxious distress (HCC)  Collaboration of Care: Other sources will be identified.   Patient/Guardian was advised Release of Information must be obtained prior to any record release in order to collaborate their care with an outside provider. Patient/Guardian was advised if they have not already done so to contact the registration department to sign all necessary forms in order for [place] to release information regarding their care.   Consent: Patient/Guardian gives verbal consent for treatment and assignment of benefits for services provided during this visit. Patient/Guardian expressed understanding and agreed to proceed.   Bynum Bellows, LCSW 03/10/2023

## 2023-03-23 ENCOUNTER — Telehealth: Payer: Self-pay

## 2023-03-23 ENCOUNTER — Other Ambulatory Visit (INDEPENDENT_AMBULATORY_CARE_PROVIDER_SITE_OTHER): Payer: 59

## 2023-03-23 ENCOUNTER — Ambulatory Visit (INDEPENDENT_AMBULATORY_CARE_PROVIDER_SITE_OTHER): Payer: 59 | Admitting: Orthopaedic Surgery

## 2023-03-23 ENCOUNTER — Encounter: Payer: Self-pay | Admitting: Orthopaedic Surgery

## 2023-03-23 DIAGNOSIS — M1711 Unilateral primary osteoarthritis, right knee: Secondary | ICD-10-CM | POA: Diagnosis not present

## 2023-03-23 MED ORDER — METHYLPREDNISOLONE ACETATE 40 MG/ML IJ SUSP
40.0000 mg | INTRAMUSCULAR | Status: AC | PRN
Start: 2023-03-23 — End: 2023-03-23
  Administered 2023-03-23: 40 mg via INTRA_ARTICULAR

## 2023-03-23 MED ORDER — LIDOCAINE HCL 1 % IJ SOLN
2.0000 mL | INTRAMUSCULAR | Status: AC | PRN
Start: 2023-03-23 — End: 2023-03-23
  Administered 2023-03-23: 2 mL

## 2023-03-23 MED ORDER — BUPIVACAINE HCL 0.5 % IJ SOLN
2.0000 mL | INTRAMUSCULAR | Status: AC | PRN
Start: 2023-03-23 — End: 2023-03-23
  Administered 2023-03-23: 2 mL via INTRA_ARTICULAR

## 2023-03-23 NOTE — Progress Notes (Signed)
Office Visit Note   Patient: Christy Haley           Date of Birth: [birth year]           MRN: [id number] Visit Date: 03/23/2023              Requested by: Gerre Scull, NP [address] [place],  [place] [address] PCP: Gerre Scull, NP   Assessment & Plan: Visit Diagnoses:  1. Unilateral primary osteoarthritis, right knee     Plan: Impression is right knee arthritis flareup.  Today, we discussed various treatment options to include right knee cortisone injection versus viscosupplementation injection both of which have helped in the past.  She would like to undergo cortisone injection today and get approval for gel injection.  She will follow-up with [place] once approved.  Call with concerns or questions in the meantime.  This patient is diagnosed with osteoarthritis of the knee(s).    Radiographs show evidence of joint space narrowing, osteophytes, subchondral sclerosis and/or subchondral cysts.  This patient has knee pain which interferes with functional and activities of daily living.    This patient has experienced inadequate response, adverse effects and/or intolerance with conservative treatments such as acetaminophen, NSAIDS, topical creams, physical therapy or regular exercise, knee bracing and/or weight loss.   This patient has experienced inadequate response or has a contraindication to intra articular steroid injections for at least 3 months.   This patient is not scheduled to have a total knee replacement within 6 months of starting treatment with viscosupplementation.   Follow-Up Instructions: Return for f/u once approved for visco inj.   Orders:  Orders Placed This Encounter  Procedures   XR KNEE 3 VIEW RIGHT   No orders of the defined types were placed in this encounter.     Procedures: Large Joint Inj: R knee on 03/23/2023 9:30 AM Indications: pain Details: 22 G needle  Arthrogram: No  Medications: 40 mg methylPREDNISolone acetate 40 MG/ML;  2 mL lidocaine 1 %; 2 mL bupivacaine 0.5 % Consent was given by the patient. Patient was prepped and draped in the usual sterile fashion.       Clinical Data: No additional findings.   Subjective: Chief Complaint  Patient presents with   Right Knee - Pain    HPI patient is a 52 year old female who comes in today with recurrent right knee pain.  History of osteoarthritis.  She was seen by Delane Ginger last June where her right knee was aspirated and injected with viscosupplementation.  This helped quite a bit for about 9 months.  Her symptoms have returned and have become worse.  The pain is to the lateral aspect.  Symptoms are worse with walking as well as really any activity.  She uses ice without significant relief.  She does tell me she has had a cortisone injection in the past which helps with the pain but not the grinding.  Review of Systems as detailed in HPI.  All others reviewed and are negative.   Objective: Vital Signs: There were no vitals taken for this visit.  Physical Exam well-developed well-nourished female no acute distress.  Alert and oriented x 3.  Ortho Exam right knee exam: Trace effusion.  Range of motion 0 to 125 degrees.  Slight tenderness to the lateral and medial joint lines.  Moderate patellofemoral crepitus.  She is neurovascularly intact distally.  Specialty Comments:  No specialty comments available.  Imaging: XR KNEE 3 VIEW RIGHT  Result Date: 03/23/2023  Moderate tricompartmental degenerative changes    PMFS History: Patient Active Problem List   Diagnosis Date Noted   Dizziness 06/11/2022   Unilateral primary osteoarthritis, right knee 11/25/2021   Insomnia 11/20/2021   Recurrent major depressive disorder, in partial remission (HCC) 01/12/2017   Generalized anxiety disorder 02/20/2015   IUD (intrauterine device) in place 08/12/2014   Cervical lesion 06/21/2014   Hyperglycemia 04/24/2013   Muscle tiredness 04/12/2013   Alopecia 04/12/2013    Loss of weight 04/12/2013   Hot flushes, perimenopausal 04/12/2013   Iron deficiency anemia 12/07/2012   Left lumbar radiculitis 12/07/2012   Leukopenia 01/14/2012   GERD 01/06/2012   Past Medical History:  Diagnosis Date   Anxiety    Depression    GERD (gastroesophageal reflux disease)     Family History  Problem Relation Age of Onset   Arthritis Mother    Hypertension Mother    Diabetes Maternal Grandmother     Past Surgical History:  Procedure Laterality Date   AUGMENTATION MAMMAPLASTY  10/09/2015   breast lift and implants    INTRAUTERINE DEVICE INSERTION  2010   Mirena, removed & re-inserted 11-06-19   KNEE SURGERY Right    PELVIC LAPAROSCOPY     ovarian cystectomy   WRIST SURGERY Left 06/21/2019   plate/    Social History   Occupational History   Occupation: Psychologist, clinical: UNEMPLOYED  Tobacco Use   Smoking status: Never   Smokeless tobacco: Never  Vaping Use   Vaping status: Never Used  Substance and Sexual Activity   Alcohol use: Yes    Comment: occ   Drug use: No   Sexual activity: Not Currently    Partners: Male    Birth control/protection: I.U.D.    Comment: Mirena IUD inserted 11-06-19

## 2023-03-23 NOTE — Telephone Encounter (Signed)
R knee visco injection approval please

## 2023-03-28 ENCOUNTER — Ambulatory Visit: Payer: 59 | Admitting: Nurse Practitioner

## 2023-03-28 ENCOUNTER — Encounter: Payer: Self-pay | Admitting: Nurse Practitioner

## 2023-03-28 VITALS — BP 118/78 | HR 62 | Temp 97.0°F | Ht 61.75 in | Wt 142.8 lb

## 2023-03-28 DIAGNOSIS — Z1211 Encounter for screening for malignant neoplasm of colon: Secondary | ICD-10-CM

## 2023-03-28 DIAGNOSIS — F3341 Major depressive disorder, recurrent, in partial remission: Secondary | ICD-10-CM | POA: Diagnosis not present

## 2023-03-28 DIAGNOSIS — F411 Generalized anxiety disorder: Secondary | ICD-10-CM | POA: Diagnosis not present

## 2023-03-28 DIAGNOSIS — Z23 Encounter for immunization: Secondary | ICD-10-CM | POA: Diagnosis not present

## 2023-03-28 DIAGNOSIS — R5383 Other fatigue: Secondary | ICD-10-CM | POA: Diagnosis not present

## 2023-03-28 MED ORDER — BUPROPION HCL ER (XL) 300 MG PO TB24
300.0000 mg | ORAL_TABLET | Freq: Every day | ORAL | 1 refills | Status: AC
Start: 2023-03-28 — End: ?

## 2023-03-28 NOTE — Progress Notes (Signed)
Established Patient Office Visit  Subjective   Patient ID: Christy Haley, female    DOB: 02-05-1971  Age: 52 y.o. MRN: [id number]  Chief Complaint  Patient presents with   Medication Management    Follow up with Rx Refills, Shingles Vaccine, Referral to Gastro    HPI  Christy Haley is here to follow-up on depression and anxiety.   She is still following with a therapist regularly, which she said has been very helpful.  Her depression anxiety are well-controlled right now.  She is also taking Wellbutrin 300 mg daily.  She denies panic attacks, SI/HI.  She is wondering if she can potentially decrease her Wellbutrin to 150 mg in the next few months.  She has been having issues with fatigue.  Her therapist and her are trying to figure it out as if it is related to depression or something else.  She feels like her depression has been doing a lot better and does not feel like this is the cause.  She is interested in lab work to see if anything is abnormal.    ROS See pertinent positives and negatives per HPI.    Objective:     BP 118/78 (BP Location: Left Arm)   Pulse 62   Temp (!) 97 F (36.1 C)   Ht 5' 1.75" (1.568 m)   Wt 142 lb 12.8 oz (64.8 kg)   SpO2 99%   BMI 26.33 kg/m  BP Readings from Last 3 Encounters:  03/28/23 118/78  11/25/22 110/76  10/28/22 102/64   Wt Readings from Last 3 Encounters:  03/28/23 142 lb 12.8 oz (64.8 kg)  11/25/22 142 lb (64.4 kg)  10/28/22 140 lb 12.8 oz (63.9 kg)      Physical Exam Vitals and nursing note reviewed.  Constitutional:      General: She is not in acute distress.    Appearance: Normal appearance.  HENT:     Head: Normocephalic.  Eyes:     Conjunctiva/sclera: Conjunctivae normal.  Cardiovascular:     Rate and Rhythm: Normal rate and regular rhythm.     Pulses: Normal pulses.     Heart sounds: Normal heart sounds.  Pulmonary:     Effort: Pulmonary effort is normal.     Breath sounds: Normal breath sounds.   Musculoskeletal:     Cervical back: Normal range of motion.  Skin:    General: Skin is warm.  Neurological:     General: No focal deficit present.     Mental Status: She is alert and oriented to person, place, and time.  Psychiatric:        Mood and Affect: Mood normal.        Behavior: Behavior normal.        Thought Content: Thought content normal.        Judgment: Judgment normal.      No results found for any visits on 03/28/23.    The ASCVD Risk score (Arnett DK, et al., 2019) failed to calculate for the following reasons:   Cannot find a previous HDL lab   Cannot find a previous total cholesterol lab    Assessment & Plan:   Problem List Items Addressed This Visit       Other   Generalized anxiety disorder - Primary    Chronic, stable.  Continue Wellbutrin XL 300 mg daily.  Encouraged her to reach out if she would like to try to decrease her dose  Continue following with therapist as well.  Follow-up in 6 months.      Relevant Medications   buPROPion (WELLBUTRIN XL) 300 MG 24 hr tablet   Recurrent major depressive disorder, in partial remission (HCC)    Chronic, stable.  Continue Wellbutrin XL 300 mg daily.  Encouraged her to reach out if she would like to try to decrease her dose  Continue following with therapist as well.  Follow-up in 6 months.      Relevant Medications   buPROPion (WELLBUTRIN XL) 300 MG 24 hr tablet   Other Visit Diagnoses     Fatigue, unspecified type       Check CMP, CBC, TSH, vitamin D, and vitamin B12. Encourage regular sleep and exercise.   Relevant Orders   CBC with Differential/Platelet   Comprehensive metabolic panel   Vitamin B12   VITAMIN D 25 Hydroxy (Vit-D Deficiency, Fractures)   TSH   Immunization due       Second shingrix given today   Relevant Orders   Varicella-zoster vaccine IM (Completed)   Screen for colon cancer       Referral placed to GI for colonoscopy   Relevant Orders   Ambulatory referral to  Gastroenterology       Return in about 6 months (around 09/25/2023) for CPE.    Gerre Scull, NP

## 2023-03-28 NOTE — Assessment & Plan Note (Signed)
Chronic, stable.  Continue Wellbutrin XL 300 mg daily.  Encouraged her to reach out if she would like to try to decrease her dose  Continue following with therapist as well.  Follow-up in 6 months.

## 2023-03-28 NOTE — Patient Instructions (Signed)
It was great to see you!  We are checking your labs today and will let you know the results via mychart/phone.   Let's follow-up in 6 months, sooner if you have concerns.  If a referral was placed today, you will be contacted for an appointment. Please note that routine referrals can sometimes take up to 3-4 weeks to process. Please call our office if you haven't heard anything after this time frame.  Take care,  Rodman Pickle, NP

## 2023-03-29 LAB — TSH: TSH: 1.2 u[IU]/mL (ref 0.35–5.50)

## 2023-03-29 LAB — CBC WITH DIFFERENTIAL/PLATELET
Basophils Absolute: 0 10*3/uL (ref 0.0–0.1)
Basophils Relative: 0.6 % (ref 0.0–3.0)
Eosinophils Absolute: 0 10*3/uL (ref 0.0–0.7)
Eosinophils Relative: 1.1 % (ref 0.0–5.0)
HCT: 36.3 % (ref 36.0–46.0)
Hemoglobin: 11.8 g/dL — ABNORMAL LOW (ref 12.0–15.0)
Lymphocytes Relative: 25.7 % (ref 12.0–46.0)
Lymphs Abs: 1 10*3/uL (ref 0.7–4.0)
MCHC: 32.6 g/dL (ref 30.0–36.0)
MCV: 84.8 fl (ref 78.0–100.0)
Monocytes Absolute: 0.3 10*3/uL (ref 0.1–1.0)
Monocytes Relative: 7 % (ref 3.0–12.0)
Neutro Abs: 2.6 10*3/uL (ref 1.4–7.7)
Neutrophils Relative %: 65.6 % (ref 43.0–77.0)
Platelets: 311 10*3/uL (ref 150.0–400.0)
RBC: 4.28 Mil/uL (ref 3.87–5.11)
RDW: 13 % (ref 11.5–15.5)
WBC: 3.9 10*3/uL — ABNORMAL LOW (ref 4.0–10.5)

## 2023-03-29 LAB — VITAMIN B12: Vitamin B-12: 1155 pg/mL — ABNORMAL HIGH (ref 211–911)

## 2023-03-29 LAB — COMPREHENSIVE METABOLIC PANEL
ALT: 17 U/L (ref 0–35)
AST: 18 U/L (ref 0–37)
Albumin: 4.4 g/dL (ref 3.5–5.2)
Alkaline Phosphatase: 53 U/L (ref 39–117)
BUN: 18 mg/dL (ref 6–23)
CO2: 27 meq/L (ref 19–32)
Calcium: 9.8 mg/dL (ref 8.4–10.5)
Chloride: 104 meq/L (ref 96–112)
Creatinine, Ser: 0.82 mg/dL (ref 0.40–1.20)
GFR: 82.55 mL/min (ref >60.00)
Glucose, Bld: 87 mg/dL (ref 70–99)
Potassium: 4 meq/L (ref 3.5–5.1)
Sodium: 139 meq/L (ref 135–145)
Total Bilirubin: 0.4 mg/dL (ref 0.2–1.2)
Total Protein: 6.6 g/dL (ref 6.0–8.3)

## 2023-03-29 LAB — VITAMIN D 25 HYDROXY (VIT D DEFICIENCY, FRACTURES): VITD: 20.57 ng/mL — ABNORMAL LOW (ref 30.00–100.00)

## 2023-03-29 NOTE — Telephone Encounter (Signed)
Called and left a VM advising patient that [facility] does not cover gel injections and advised about TriVisc and to CB to discuss if she would like to proceed.

## 2023-03-31 ENCOUNTER — Ambulatory Visit (INDEPENDENT_AMBULATORY_CARE_PROVIDER_SITE_OTHER): Payer: PRIVATE HEALTH INSURANCE | Admitting: Licensed Clinical Social Worker

## 2023-03-31 DIAGNOSIS — F331 Major depressive disorder, recurrent, moderate: Secondary | ICD-10-CM
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
## 2023-04-14 ENCOUNTER — Ambulatory Visit (HOSPITAL_COMMUNITY): Payer: PRIVATE HEALTH INSURANCE | Admitting: Licensed Clinical Social Worker

## 2023-04-14 DIAGNOSIS — F331 Major depressive disorder, recurrent, moderate: Secondary | ICD-10-CM | POA: Diagnosis not present

## 2023-04-14 NOTE — Progress Notes (Signed)
   THERAPIST PROGRESS NOTE  Session Time: 8:00 am-8:45 am  Type of Therapy: Individual Therapy  Session#20  Purpose of Session: Christy Haley will manage mood and anxiety as evidenced by improving self esteem, challenging anxious and depressive thoughts, managing racing thoughts/overthinking, cope with loss, reducing people pleasing, and improving sleep for 5 out of 7 days for 60 days.  ProgressTowards Goals: Progressing  Interventions: Therapist utilized CBT, Solution focused brief therapy, and ACT to address anxiety and depression. Therapist provided support and empathy to patient during session. Therapist administered the PHQ9 and GAD7 to patient during session. Therapist worked with patient on her communication which impacts her mood and self esteem.    Effectiveness: Patient was oriented x4 (person, place, situation, and time). Patient was casually dressed, and appropriately groomed. Patient was alert, engaged, pleasant, and cooperative. Patient completed a PHQ9 with a score of 3 indicating minimal or no depressive symptoms present. Patient completed a GAD7 with a score of 3 indicating mild anxious symptoms present. Patient has had low energy. She is sleeping better though. Patient has been working on her responses to others. She had a conversation with a friend from church who was telling her that she listened to a sermon on relationships and she told patient she wasn't doing things right to find a "man." Patient was telling her that she didn't feel the same way about what she said and the friend from church told her she didn't have God in her heart. Patient was frustrated by this. She is trying to work on her responses. She allowed herself to get angry and she is wanting to have control on her responses. Patient is going to continue to work on her responses.     Patient engaged in session. Patient responded well to interventions. Patient continues to meet criteria for Major depressive disorder,  recurrent episode, moderate with anxious distress. Patient will continue in outpatient therapy due to being the least restrictive service to meet her needs. Patient made moderate progress.  Suicidal/Homicidal: Nowithout intent/plan  Plan: Return again in 2-4 weeks.   Diagnosis: Major depressive disorder, recurrent episode, moderate with anxious distress (HCC)  Collaboration of Care: Other sources will be identified.   Patient/Guardian was advised Release of Information must be obtained prior to any record release in order to collaborate their care with an outside provider. Patient/Guardian was advised if they have not already done so to contact the registration department to sign all necessary forms in order for [place] to release information regarding their care.   Consent: Patient/Guardian gives verbal consent for treatment and assignment of benefits for services provided during this visit. Patient/Guardian expressed understanding and agreed to proceed.   Bynum Bellows, LCSW 04/14/2023

## 2023-04-28 ENCOUNTER — Ambulatory Visit (INDEPENDENT_AMBULATORY_CARE_PROVIDER_SITE_OTHER): Payer: PRIVATE HEALTH INSURANCE | Admitting: Licensed Clinical Social Worker

## 2023-04-28 DIAGNOSIS — F331 Major depressive disorder, recurrent, moderate: Secondary | ICD-10-CM

## 2023-04-29 NOTE — Progress Notes (Signed)
   THERAPIST PROGRESS NOTE  Session Time: 8:00 am-8:45 am  Type of Therapy: Individual Therapy  Session#21  Purpose of Session: Tieara will manage mood and anxiety as evidenced by improving self esteem, challenging anxious and depressive thoughts, managing racing thoughts/overthinking, cope with loss, reducing people pleasing, and improving sleep for 5 out of 7 days for 60 days.  ProgressTowards Goals: Progressing  Interventions: Therapist utilized CBT, Solution focused brief therapy, and ACT to address anxiety and depression. Therapist provided support and empathy to patient during session. Therapist administered the PHQ9 and GAD7 to patient during session. Therapist worked with patient on her anxious and depressive thoughts by identify cognitive distortions such as jumping to conclusions.    Effectiveness: Patient was oriented x4 (person, place, situation, and time). Patient was casually dressed, and appropriately groomed. Patient was alert, engaged, pleasant, and cooperative. Patient completed a PHQ9 with a score of 3 indicating minimal or no depressive symptoms present. Patient completed a GAD7 with a score of 3 indicating mild anxious symptoms present. Patient feels like things have been ok. Her brother came into town and she was able to show him and his wife around. Patient's brother asked her to come to the home of her sister's home whom she has a strained relationship. Patient went and interacted with her sister. Patient feels like she jumped to conclusions when she was around her sister. She thought that her sister would say something negative about her so patient said something "negative" about herself before her sister could. Patient realized after that her sister never said anything or even seemed like she was going to. Patient said that next time she would pause and listen before she reacted. Patient also noted that if she talks to her again and her sister is critical she is going to  pause, not react, and tell her what works for her sister may not work for her.    Patient engaged in session. Patient responded well to interventions. Patient continues to meet criteria for Major depressive disorder, recurrent episode, moderate with anxious distress. Patient will continue in outpatient therapy due to being the least restrictive service to meet her needs. Patient made moderate progress.  Suicidal/Homicidal: Nowithout intent/plan  Plan: Return again in 2-4 weeks.   Diagnosis: Major depressive disorder, recurrent episode, moderate with anxious distress (HCC)  Collaboration of Care: Other sources will be identified.   Patient/Guardian was advised Release of Information must be obtained prior to any record release in order to collaborate their care with an outside provider. Patient/Guardian was advised if they have not already done so to contact the registration department to sign all necessary forms in order for [place] to release information regarding their care.   Consent: Patient/Guardian gives verbal consent for treatment and assignment of benefits for services provided during this visit. Patient/Guardian expressed understanding and agreed to proceed.   Bynum Bellows, LCSW 04/29/2023

## 2023-05-11 ENCOUNTER — Telehealth: Payer: Self-pay | Admitting: Orthopaedic Surgery

## 2023-05-11 NOTE — Telephone Encounter (Signed)
Talked with patient concerning gel injection and submitted for TriVisc, right knee

## 2023-05-11 NOTE — Telephone Encounter (Signed)
Patient called. Would like to know how much the other injection would be? Her cb# is [phone]

## 2023-05-20 ENCOUNTER — Ambulatory Visit (INDEPENDENT_AMBULATORY_CARE_PROVIDER_SITE_OTHER): Payer: PRIVATE HEALTH INSURANCE | Admitting: Licensed Clinical Social Worker

## 2023-05-20 DIAGNOSIS — F331 Major depressive disorder, recurrent, moderate: Secondary | ICD-10-CM

## 2023-05-20 NOTE — Progress Notes (Unsigned)
THERAPIST PROGRESS NOTE  Session Time: 8:01 am-8:45 am  Type of Therapy: Individual Therapy  Session#22  Purpose of Session: Ciri will manage mood and anxiety as evidenced by improving self esteem, challenging anxious and depressive thoughts, managing racing thoughts/overthinking, cope with loss, reducing people pleasing, and improving sleep for 5 out of 7 days for 60 days.  ProgressTowards Goals: Progressing  Interventions: Therapist utilized CBT, Solution focused brief therapy, and ACT to address anxiety and depression. Therapist provided support and empathy to patient during session. Therapist administered the PHQ9 and GAD7 to patient during session. Therapist worked with patient on managing thoughts/overthinking and improving self esteem.    Effectiveness: Patient was oriented x4 (person, place, situation, and time). Patient was casually dressed, and appropriately groomed. Patient was alert, engaged, pleasant, and cooperative. Patient completed a PHQ9 with a score of 10 indicating moderate depressive symptoms present. Patient completed a GAD7 with a score of 8 indicating mild anxious symptoms present. Patient noted that things have been going ok. She has noticed that she takes things personally when she doesn't need to. Patient understood personalization is a cognitive distortion. Patient shared experiences where a friend felt like she didn't give her support she needed when she was going through her divorce. Patient was able to recognize this and is trying to do better. She can recognize when she needs to change something but is also going to try to avoid getting angry if someone says something she doesn't agree with or makes a comment to her. She is going to remind herself that it is their opinion, and take a breath before she responds. She can feel anger in her body and the breathing would help calm her nervous system down.    Patient engaged in session. Patient responded well to  interventions. Patient continues to meet criteria for Major depressive disorder, recurrent episode, moderate with anxious distress. Patient will continue in outpatient therapy due to being the least restrictive service to meet her needs. Patient made moderate progress.     05/20/2023    8:07 AM 04/28/2023    8:08 AM 04/14/2023    8:11 AM  Depression screen PHQ 2/9  Decreased Interest 2 1 1   Down, Depressed, Hopeless 2 1 1   PHQ - 2 Score 4 2 2   Altered sleeping 2 0 0  Tired, decreased energy 2 1 1   Change in appetite 1 0 0  Feeling bad or failure about yourself  0 0 0  Trouble concentrating 1 1 1   Moving slowly or fidgety/restless 0 0 0  Suicidal thoughts 0 0 0  PHQ-9 Score 10 4 4        05/20/2023    8:08 AM 04/28/2023    8:08 AM 04/14/2023    8:12 AM 03/28/2023    3:02 PM  GAD 7 : Generalized Anxiety Score  Nervous, Anxious, on Edge 1 1 1 1   Control/stop worrying 1 1 1 1   Worry too much - different things 1 1 1 1   Trouble relaxing 1 0 1 1  Restless 1 0 0 0  Easily annoyed or irritable 2 1 1  0  Afraid - awful might happen 1 0 0 0  Total GAD 7 Score 8 4 5 4   Anxiety Difficulty Somewhat difficult Somewhat difficult Somewhat difficult Somewhat difficult      Suicidal/Homicidal: Nowithout intent/plan  Plan: Return again in 2-4 weeks.   Diagnosis: Major depressive disorder, recurrent episode, moderate with anxious distress (HCC)  Collaboration of Care: Other sources will  be identified.   Patient/Guardian was advised Release of Information must be obtained prior to any record release in order to collaborate their care with an outside provider. Patient/Guardian was advised if they have not already done so to contact the registration department to sign all necessary forms in order for [place] to release information regarding their care.   Consent: Patient/Guardian gives verbal consent for treatment and assignment of benefits for services provided during this visit. Patient/Guardian  expressed understanding and agreed to proceed.   Bynum Bellows, LCSW 05/20/2023

## 2023-05-26 ENCOUNTER — Telehealth: Payer: Self-pay

## 2023-05-26 DIAGNOSIS — M1711 Unilateral primary osteoarthritis, right knee: Secondary | ICD-10-CM

## 2023-05-26 NOTE — Telephone Encounter (Signed)
Called and left a VM for patient to CB to schedule for 3 appts.with Dr. Roda Shutters or Mardella Layman.  See referrals tab for gel information

## 2023-06-03 ENCOUNTER — Ambulatory Visit (INDEPENDENT_AMBULATORY_CARE_PROVIDER_SITE_OTHER): Payer: PRIVATE HEALTH INSURANCE | Admitting: Licensed Clinical Social Worker

## 2023-06-03 DIAGNOSIS — F331 Major depressive disorder, recurrent, moderate: Secondary | ICD-10-CM

## 2023-06-04 NOTE — Progress Notes (Signed)
THERAPIST PROGRESS NOTE  Session Time: 8:10 am-8:50 am  Type of Therapy: Individual Therapy  Session#23  Purpose of Session: Ilissa will manage mood and anxiety as evidenced by improving self esteem, challenging anxious and depressive thoughts, managing racing thoughts/overthinking, cope with loss, reducing people pleasing, and improving sleep for 5 out of 7 days for 60 days.  ProgressTowards Goals: Progressing  Interventions: Therapist utilized CBT, Solution focused brief therapy, and ACT to address anxiety and depression. Therapist provided support and empathy to patient during session. Therapist administered the PHQ9 and GAD7 to patient during session. Therapist worked with patient to challenge anxious and depressive thoughts by facing her thoughts.    Effectiveness: Patient was oriented x4 (person, place, situation, and time). Patient was casually dressed, and appropriately groomed. Patient was alert, engaged, pleasant, and cooperative. Patient completed a PHQ9 with a score of 5 indicating mild depressive symptoms present. Patient completed a GAD7 with a score of 7 indicating mild anxious symptoms present. Patient noted that she has been doing pretty well. She had a situation where she cooked 2 turkeys for a church gathering as well as some other dishes. Patient noted she took everything but the turkeys into the church due to them being too heavy on the tray for her. She was looking for a man to carry them in for her and she found one. When she went in she saw the man had dropped her turkeys on the ground and several people were cleaning their clothes as well as the floor up. Patient felt very embarrassed and went to sit in her car for about 20 minutes She thought about leaving due to feeling a mix of emotions including anxiety, anger, and hurt. After a period of time she was able to go back in and enjoy the meal even though she was a little more quiet than normal. Patient wasn't sure if she did  the right thing. She feels like she avoids situations and puts her feelings in a "box." Patient understood that this time she put the irrational thoughts in the box, allowed herself to feel, and was able to return to the situation rather than leave. Patient is continuing to work on facing her feelings.    Patient engaged in session. Patient responded well to interventions. Patient continues to meet criteria for Major depressive disorder, recurrent episode, moderate with anxious distress. Patient will continue in outpatient therapy due to being the least restrictive service to meet her needs. Patient made moderate progress.     06/03/2023    8:19 AM 05/20/2023    8:07 AM 04/28/2023    8:08 AM  Depression screen PHQ 2/9  Decreased Interest 1 2 1   Down, Depressed, Hopeless 1 2 1   PHQ - 2 Score 2 4 2   Altered sleeping 1 2 0  Tired, decreased energy 1 2 1   Change in appetite 1 1 0  Feeling bad or failure about yourself  0 0 0  Trouble concentrating 0 1 1  Moving slowly or fidgety/restless 0 0 0  Suicidal thoughts 0 0 0  PHQ-9 Score 5 10 4        06/03/2023    8:19 AM 05/20/2023    8:08 AM 04/28/2023    8:08 AM 04/14/2023    8:12 AM  GAD 7 : Generalized Anxiety Score  Nervous, Anxious, on Edge 1 1 1 1   Control/stop worrying 1 1 1 1   Worry too much - different things 1 1 1 1   Trouble relaxing 1  1 0 1  Restless 1 1 0 0  Easily annoyed or irritable 1 2 1 1   Afraid - awful might happen 1 1 0 0  Total GAD 7 Score 7 8 4 5   Anxiety Difficulty Somewhat difficult Somewhat difficult Somewhat difficult Somewhat difficult      Suicidal/Homicidal: Nowithout intent/plan  Plan: Return again in 2-4 weeks.   Diagnosis: Major depressive disorder, recurrent episode, moderate with anxious distress (HCC)  Collaboration of Care: Other sources will be identified.   Patient/Guardian was advised Release of Information must be obtained prior to any record release in order to collaborate their care with  an outside provider. Patient/Guardian was advised if they have not already done so to contact the registration department to sign all necessary forms in order for [place] to release information regarding their care.   Consent: Patient/Guardian gives verbal consent for treatment and assignment of benefits for services provided during this visit. Patient/Guardian expressed understanding and agreed to proceed.   Bynum Bellows, LCSW 06/04/2023

## 2023-06-15 ENCOUNTER — Ambulatory Visit: Payer: 59 | Admitting: Orthopaedic Surgery

## 2023-06-16 ENCOUNTER — Ambulatory Visit (HOSPITAL_COMMUNITY): Payer: PRIVATE HEALTH INSURANCE | Admitting: Licensed Clinical Social Worker

## 2023-06-23 ENCOUNTER — Ambulatory Visit: Payer: 59 | Admitting: Orthopaedic Surgery

## 2023-06-23 DIAGNOSIS — M1711 Unilateral primary osteoarthritis, right knee: Secondary | ICD-10-CM | POA: Diagnosis not present

## 2023-06-23 MED ORDER — SODIUM HYALURONATE (VISCOSUP) 25 MG/2.5ML IX SOSY
25.0000 mg | PREFILLED_SYRINGE | INTRA_ARTICULAR | Status: AC | PRN
Start: 2023-06-23 — End: 2023-06-23
  Administered 2023-06-23: 25 mg via INTRA_ARTICULAR

## 2023-06-23 NOTE — Progress Notes (Signed)
   Procedure Note  Patient: Christy Haley             Date of Birth: Sep 23, 1970           MRN: [id number]             Visit Date: 06/23/2023  Procedures: Visit Diagnoses:  1. Primary osteoarthritis of right knee    Luigina is here today for first round of TriVisc injection.  She tolerated this well.  Follow-up next week for second round.  Large Joint Inj: R knee on 06/23/2023 8:21 AM Indications: pain Details: 22 G needle  Arthrogram: No  Medications: 25 mg Sodium Hyaluronate (Viscosup) 25 MG/2.5ML Outcome: tolerated well, no immediate complications Patient was prepped and draped in the usual sterile fashion.

## 2023-06-29 DIAGNOSIS — M1711 Unilateral primary osteoarthritis, right knee: Secondary | ICD-10-CM | POA: Diagnosis not present

## 2023-06-29 NOTE — Progress Notes (Unsigned)
   Procedure Note  Patient: Christy Haley             Date of Birth: October 30, 1970           MRN: [id number]             Visit Date: 06/30/2023  Procedures: Visit Diagnoses:  1. Primary osteoarthritis of right knee    [name] comes in today for 2nd trivisc injection.  Large Joint Inj: R knee on 06/29/2023 4:46 PM Indications: pain Details: 22 G needle  Arthrogram: No  Medications: 25 mg Sodium Hyaluronate (Viscosup) 25 MG/2.5ML Outcome: tolerated well, no immediate complications Patient was prepped and draped in the usual sterile fashion.

## 2023-06-30 ENCOUNTER — Encounter: Payer: Self-pay | Admitting: Orthopaedic Surgery

## 2023-06-30 ENCOUNTER — Ambulatory Visit (INDEPENDENT_AMBULATORY_CARE_PROVIDER_SITE_OTHER): Payer: 59 | Admitting: Orthopaedic Surgery

## 2023-06-30 DIAGNOSIS — M1711 Unilateral primary osteoarthritis, right knee: Secondary | ICD-10-CM

## 2023-06-30 MED ORDER — SODIUM HYALURONATE (VISCOSUP) 25 MG/2.5ML IX SOSY
25.0000 mg | PREFILLED_SYRINGE | INTRA_ARTICULAR | Status: AC | PRN
Start: 2023-06-29 — End: 2023-06-29
  Administered 2023-06-29: 25 mg via INTRA_ARTICULAR

## 2023-07-06 DIAGNOSIS — M1711 Unilateral primary osteoarthritis, right knee: Secondary | ICD-10-CM | POA: Diagnosis not present

## 2023-07-06 NOTE — Progress Notes (Unsigned)
   Procedure Note  Patient: Christy Haley             Date of Birth: [birth year]           MRN: [id number]             Visit Date: 07/07/2023  Procedures: Visit Diagnoses:  1. Unilateral primary osteoarthritis, right knee    Virgina underwent third trivisc injection today.  Large Joint Inj: R knee on 07/06/2023 4:32 PM Indications: pain Details: 22 G needle  Arthrogram: No  Medications: 25 mg Sodium Hyaluronate (Viscosup) 25 MG/2.5ML Outcome: tolerated well, no immediate complications Patient was prepped and draped in the usual sterile fashion.

## 2023-07-07 ENCOUNTER — Encounter: Payer: Self-pay | Admitting: Orthopaedic Surgery

## 2023-07-07 ENCOUNTER — Ambulatory Visit: Payer: 59 | Admitting: Orthopaedic Surgery

## 2023-07-07 DIAGNOSIS — M1711 Unilateral primary osteoarthritis, right knee: Secondary | ICD-10-CM

## 2023-07-07 MED ORDER — SODIUM HYALURONATE (VISCOSUP) 25 MG/2.5ML IX SOSY
25.0000 mg | PREFILLED_SYRINGE | INTRA_ARTICULAR | Status: AC | PRN
Start: 2023-07-06 — End: 2023-07-06
  Administered 2023-07-06: 25 mg via INTRA_ARTICULAR

## 2023-07-14 ENCOUNTER — Ambulatory Visit (INDEPENDENT_AMBULATORY_CARE_PROVIDER_SITE_OTHER): Payer: Medicaid Other | Admitting: Licensed Clinical Social Worker

## 2023-07-14 DIAGNOSIS — F331 Major depressive disorder, recurrent, moderate: Secondary | ICD-10-CM | POA: Diagnosis not present

## 2023-07-14 NOTE — Progress Notes (Signed)
 THERAPIST PROGRESS NOTE  Session Time: 8:03 am-8:45 am  Type of Therapy: Individual Therapy  Session#24  Purpose of Session: Sandie will manage mood and anxiety as evidenced by improving self esteem, challenging anxious and depressive thoughts, managing racing thoughts/overthinking, cope with loss, reducing people pleasing, and improving sleep for 5 out of 7 days for 60 days.  ProgressTowards Goals: Progressing  Interventions: Therapist utilized CBT, Solution focused brief therapy, and ACT to address anxiety and depression. Therapist provided support and empathy to patient during session. Therapist administered the PHQ9 and GAD7 to patient during session. Therapist worked with patient on improving self esteem and challenging anxious and depressive thoughts through sharing emotions/affection with daughters.    Effectiveness: Patient was oriented x4 (person, place, situation, and time). Patient was casually dressed, and appropriately groomed. Patient was alert, engaged, pleasant, and cooperative. Patient completed a PHQ9 and GAD7. Patient noted that things have been up and down. Patient noted she went to see her mother. It went well but she feels like she doesn't express her emotions to her mother in person but feels like she buys her things to compensate. She noticed that she doesn't show or express her feelings to her oldest daughter. Patient wants to change this. She didn't grow up with her mother showing her affection and she questions if her mother loves her. She doesn't want her daughters to feel the same way. Patient is going to start expressing and showing emotions through affection and asking her daughters how their day went. She understood this is a way to change generational patterns, help her daughters feel loved, and to help her forgive herself.    Patient engaged in session. Patient responded well to interventions. Patient continues to meet criteria for Major depressive disorder,  recurrent episode, moderate with anxious distress. Patient will continue in outpatient therapy due to being the least restrictive service to meet her needs. Patient made moderate progress.     07/14/2023    8:08 AM 06/03/2023    8:19 AM 05/20/2023    8:07 AM  Depression screen PHQ 2/9  Decreased Interest 1 1 2   Down, Depressed, Hopeless 1 1 2   PHQ - 2 Score 2 2 4   Altered sleeping 2 1 2   Tired, decreased energy 1 1 2   Change in appetite 1 1 1   Feeling bad or failure about yourself  1 0 0  Trouble concentrating 1 0 1  Moving slowly or fidgety/restless 1 0 0  Suicidal thoughts 0 0 0  PHQ-9 Score 9 5 10        07/14/2023    8:08 AM 06/03/2023    8:19 AM 05/20/2023    8:08 AM 04/28/2023    8:08 AM  GAD 7 : Generalized Anxiety Score  Nervous, Anxious, on Edge 2 1 1 1   Control/stop worrying 1 1 1 1   Worry too much - different things 1 1 1 1   Trouble relaxing 1 1 1  0  Restless 1 1 1  0  Easily annoyed or irritable 2 1 2 1   Afraid - awful might happen 0 1 1 0  Total GAD 7 Score 8 7 8 4   Anxiety Difficulty Somewhat difficult Somewhat difficult Somewhat difficult Somewhat difficult      Suicidal/Homicidal: Nowithout intent/plan  Plan: Return again in 2-4 weeks.   Diagnosis: Major depressive disorder, recurrent episode, moderate with anxious distress (HCC)  Collaboration of Care: Other sources will be identified.   Patient/Guardian was advised Release of Information must be obtained prior  to any record release in order to collaborate their care with an outside provider. Patient/Guardian was advised if they have not already done so to contact the registration department to sign all necessary forms in order for us  to release information regarding their care.   Consent: Patient/Guardian gives verbal consent for treatment and assignment of benefits for services provided during this visit. Patient/Guardian expressed understanding and agreed to proceed.   Fonda Conroy, LCSW 07/14/2023

## 2023-07-19 ENCOUNTER — Ambulatory Visit: Payer: Medicaid Other | Admitting: Nurse Practitioner

## 2023-07-19 ENCOUNTER — Telehealth: Payer: Self-pay | Admitting: Nurse Practitioner

## 2023-07-19 ENCOUNTER — Encounter: Payer: Self-pay | Admitting: Nurse Practitioner

## 2023-07-19 VITALS — BP 120/88 | HR 55 | Temp 97.6°F | Ht 61.75 in | Wt 144.6 lb

## 2023-07-19 DIAGNOSIS — Z1211 Encounter for screening for malignant neoplasm of colon: Secondary | ICD-10-CM | POA: Diagnosis not present

## 2023-07-19 DIAGNOSIS — K529 Noninfective gastroenteritis and colitis, unspecified: Secondary | ICD-10-CM | POA: Insufficient documentation

## 2023-07-19 NOTE — Progress Notes (Signed)
 Acute Office Visit  Subjective:     Patient ID: Christy Haley, female    DOB: Jul 23, 1970, 53 y.o.   MRN: [id number]  Chief Complaint  Patient presents with   Stomach Ache and Cramping    For 2 weeks with nausea    HPI Patient is in today for abdominal pain for the last 9 days.  Discussed the use of AI scribe software for clinical note transcription with the patient, who gave verbal consent to proceed.  History of Present Illness   The patient, presents with a stomachache for the last 9 days. The pain is located in the middle of the abdomen and is associated with cramping, nausea, and occasional diarrhea. The patient reports feeling hungry despite the discomfort and nausea, and sometimes experiences diarrhea after eating. She has been maintaining a diet of chicken soup for the past week, and has been staying hydrated. The patient also reports bloating and has vomited twice over the course of the week. The onset of symptoms was after a meal out, where she consumed pasta and wine. The patient has not taken any over-the-counter medications for her symptoms. She denies fevers, recent travel, and sick contacts.      ROS See pertinent positives and negatives per HPI.     Objective:    BP 120/88 (BP Location: Left Arm, Patient Position: Sitting, Cuff Size: Small)   Pulse (!) 55   Temp 97.6 F (36.4 C)   Ht 5' 1.75 (1.568 m)   Wt 144 lb 9.6 oz (65.6 kg)   SpO2 100%   BMI 26.66 kg/m    Physical Exam Vitals and nursing note reviewed.  Constitutional:      General: She is not in acute distress.    Appearance: Normal appearance.  HENT:     Head: Normocephalic.  Eyes:     Conjunctiva/sclera: Conjunctivae normal.  Cardiovascular:     Rate and Rhythm: Normal rate and regular rhythm.     Pulses: Normal pulses.     Heart sounds: Normal heart sounds.  Pulmonary:     Effort: Pulmonary effort is normal.     Breath sounds: Normal breath sounds.  Abdominal:     General: Abdomen  is flat. Bowel sounds are normal.     Palpations: Abdomen is soft.     Tenderness: There is abdominal tenderness in the periumbilical area. There is no guarding or rebound. Negative signs include Murphy's sign and McBurney's sign.     Hernia: No hernia is present.  Musculoskeletal:     Cervical back: Normal range of motion.  Skin:    General: Skin is warm.  Neurological:     General: No focal deficit present.     Mental Status: She is alert and oriented to person, place, and time.  Psychiatric:        Mood and Affect: Mood normal.        Behavior: Behavior normal.        Thought Content: Thought content normal.        Judgment: Judgment normal.       Assessment & Plan:   Problem List Items Addressed This Visit       Digestive   Gastroenteritis - Primary   She presents with epigastric pain, nausea, vomiting, and diarrhea for over a week, without fever, recent travel, or sick contacts, could be viral vs foodborne. Despite no improvement with a bland diet and hydration, we will start Zofran  8mg  every 8 hours as needed for  nausea, Pepto-Bismol as needed for stomach pain, and Imodium after episodes of diarrhea. She should continue with the bland diet and hydration. We will collect a stool sample for culture, c-diff, and O&P. If symptoms persist, we will consider imaging.         Relevant Orders   Cdiff NAA+O+P+Stool Culture   Other Visit Diagnoses       Screen for colon cancer       Referral placed to GI for screening colonoscopy   Relevant Orders   Ambulatory referral to Gastroenterology       No orders of the defined types were placed in this encounter.   Return if symptoms worsen or fail to improve.  Tinnie DELENA Harada, NP

## 2023-07-19 NOTE — Telephone Encounter (Signed)
 FYI: This call has been transferred to triage nurse: the Triage Nurse. Once the result note has been entered staff can address the message at that time.  Patient called in with the following symptoms:  Red Word:abdominal pain and throwing up after eating for the past two days. Pt scheduled appt via mychart for 07/21/23 with [name]. I called and spoke with pt, her pain level is at an 8.   Please advise at Mobile [phone] (mobile)  Message is routed to Provider Pool.

## 2023-07-19 NOTE — Assessment & Plan Note (Signed)
 She presents with epigastric pain, nausea, vomiting, and diarrhea for over a week, without fever, recent travel, or sick contacts, could be viral vs foodborne. Despite no improvement with a bland diet and hydration, we will start Zofran  8mg  every 8 hours as needed for nausea, Pepto-Bismol as needed for stomach pain, and Imodium after episodes of diarrhea. She should continue with the bland diet and hydration. We will collect a stool sample for culture, c-diff, and O&P. If symptoms persist, we will consider imaging.

## 2023-07-19 NOTE — Telephone Encounter (Signed)
 Noted.

## 2023-07-19 NOTE — Patient Instructions (Addendum)
 It was great to see you!  Start zofran  every 8 hours as needed for nausea  Start peptobismol as needed for stomach pain. This can turn your stool black  You can take imodium after having an episode of diarrhea  Keep drinking fluids  Eat bland foods, plain chicken, bananas, toast, applesauce, rice, etc.   Bring back a stool sample to our office Monday-Friday 8am-5pm (ideally not between 12pm-1pm)  Let's follow-up if your not getting better next week  Take care,  Tinnie Harada, NP

## 2023-07-21 ENCOUNTER — Ambulatory Visit: Payer: Medicaid Other | Admitting: Nurse Practitioner

## 2023-07-21 ENCOUNTER — Other Ambulatory Visit: Payer: Medicaid Other

## 2023-07-21 DIAGNOSIS — K529 Noninfective gastroenteritis and colitis, unspecified: Secondary | ICD-10-CM

## 2023-07-26 LAB — CDIFF NAA+O+P+STOOL CULTURE
Toxigenic C. Difficile by PCR: NEGATIVE
Toxigenic C. Difficile by PCR: NEGATIVE
Toxigenic C. Difficile by PCR: NEGATIVE

## 2023-08-03 ENCOUNTER — Encounter: Payer: Self-pay | Admitting: Pediatrics

## 2023-09-02 ENCOUNTER — Telehealth: Payer: Self-pay | Admitting: *Deleted

## 2023-09-02 NOTE — Telephone Encounter (Signed)
 Attempt to reach pt for pre-visit. LM with call back #.  Will attempt to reach again in 5 min due to no other # listed in profile  Second attempt to reach pt for pre-vist unsuccessful. LM with facility # for pt to call back. Instructed pt to call # given by end of the day and reschedule the pre-visit  with RN or the scheduled procedure will be canceled.

## 2023-09-02 NOTE — Progress Notes (Unsigned)
 Pt's name and DOB verified at the beginning of the pre-visit wit 2 identifiers   Pt denies any difficulty with ambulating,sitting, laying down or rolling side to side  Pt has no issues with ambulation   Pt has no issues moving head neck or swallowing  No egg or soy allergy known to patient   No issues known to pt with past sedation with any surgeries or procedures  Pt denies having issues being intubated  Patient denies ever being intubated  No FH of Malignant Hyperthermia  Pt is not on diet pills or shots  Pt is not on home 02   Pt is not on blood thinners   Pt denies issues with constipation   Pt has frequent issues with constipation RN instructed pt to use Miralax per bottles instructions a week before prep days. Pt states they will  Pt is not on dialysis  Pt denise any abnormal heart rhythms   Pt denies any upcoming cardiac testing  Patient's chart reviewed by Cathlyn Parsons CNRA prior to pre-visit and patient appropriate for the [facility].  Pre-visit completed and red dot placed by patient's name on their procedure day (on provider's schedule).    Visit by phone  Pt states weight is  IInstructions reviewed. Pt given [facility], [facility] main # and MD on call # prior to instructions.  Pt states understanding of instructions. Instructed to review again prior to procedure. Pt states they will.   Informed pt that they will receive a text or  call from [facility] regarding there prep med.

## 2023-09-06 ENCOUNTER — Telehealth: Payer: Self-pay | Admitting: *Deleted

## 2023-09-06 ENCOUNTER — Ambulatory Visit: Payer: Medicaid Other

## 2023-09-06 NOTE — Progress Notes (Unsigned)
 Pt's name and DOB verified at the beginning of the pre-visit wit 2 identifiers  Permission given to speak with  Pt denies any difficulty with ambulating,sitting, laying down or rolling side to side  Pt has no issues with ambulation   Pt has no issues moving head neck or swallowing  No egg or soy allergy known to patient   No issues known to pt with past sedation with any surgeries or procedures  Pt denies having issues being intubated  Patient denies ever being intubated  No FH of Malignant Hyperthermia  Pt is not on diet pills or shots  Pt is not on home 02   Pt is not on blood thinners   Pt denies issues with constipation   Pt has frequent issues with constipation RN instructed pt to use Miralax per bottles instructions a week before prep days. Pt states they will  Pt is not on dialysis  Pt denise any abnormal heart rhythms   Pt denies any upcoming cardiac testing  Patient's chart reviewed by Cathlyn Parsons CNRA prior to pre-visit and patient appropriate for the [facility].  Pre-visit completed and red dot placed by patient's name on their procedure day (on provider's schedule).     Visit by phone  Pt states weight is    IInstructions reviewed. Pt given [facility], [facility] main # and MD on call # prior to instructions.  Pt states understanding of instructions. Instructed to review again prior to procedure. Pt states they will.   Informed pt that they will receive a text or  call from [facility] regarding there prep med.

## 2023-09-06 NOTE — Telephone Encounter (Signed)
 Attempt to reach pt for pre-visit. LM with call back #.  Will attempt to reach again in 5 min due to no other # listed in profile  Second attempt to reach pt for pre-vist unsuccessful. LM with facility # for pt to call back. Instructed pt to call # given by end of the day and reschedule the pre-visit  with RN or the scheduled procedure will be canceled.

## 2023-09-07 ENCOUNTER — Ambulatory Visit (INDEPENDENT_AMBULATORY_CARE_PROVIDER_SITE_OTHER): Payer: Medicaid Other | Admitting: Licensed Clinical Social Worker

## 2023-09-07 DIAGNOSIS — F331 Major depressive disorder, recurrent, moderate: Secondary | ICD-10-CM | POA: Diagnosis not present

## 2023-09-07 NOTE — Progress Notes (Signed)
 THERAPIST PROGRESS NOTE  Session Time: 8:05 am-8:45 am  Type of Therapy: Individual Therapy  Session#25  Purpose of Session: Navi will manage mood and anxiety as evidenced by improving self esteem, challenging anxious and depressive thoughts, managing racing thoughts/overthinking, cope with loss, reducing people pleasing, and improving sleep for 5 out of 7 days for 60 days.  ProgressTowards Goals: Progressing  Interventions: Therapist utilized CBT, Solution focused brief therapy, and ACT to address anxiety and depression. Therapist provided support and empathy to patient during session. Therapist administered the PHQ9 and GAD7 to patient during session.    Effectiveness: Patient was oriented x4 (person, place, situation, and time). Patient was casually dressed, and appropriately groomed. Patient was alert, engaged, pleasant, and cooperative. Patient completed a PHQ9 and GAD7. Patient went to [place] for a a week and a half. She didn't take her medicine with her. Patient went to stay with a friend's cousin. She was ready to get back home. When she got back home, she got sick with the flu. Patient didn't take her medicine while she was sick. She is still feeling down. Patient continues to struggle with confidence. She doesn't feel like she is capable to finishing her GED and getting a better job. She has a self defeating narrative in her mind that comes from the negative messages she got in childhood and in her marriage. She is trying to change those by stepping out of her comfort zone. Patient is going to start working on her internal narrative to change how she views herself.    Patient engaged in session. Patient responded well to interventions. Patient continues to meet criteria for Major depressive disorder, recurrent episode, moderate with anxious distress. Patient will continue in outpatient therapy due to being the least restrictive service to meet her needs. Patient made moderate  progress.     09/07/2023    8:10 AM 07/14/2023    8:08 AM 06/03/2023    8:19 AM  Depression screen PHQ 2/9  Decreased Interest 1 1 1   Down, Depressed, Hopeless 2 1 1   PHQ - 2 Score 3 2 2   Altered sleeping 1 2 1   Tired, decreased energy 1 1 1   Change in appetite 0 1 1  Feeling bad or failure about yourself  1 1 0  Trouble concentrating 1 1 0  Moving slowly or fidgety/restless 1 1 0  Suicidal thoughts 0 0 0  PHQ-9 Score 8 9 5        09/07/2023    8:11 AM 07/14/2023    8:08 AM 06/03/2023    8:19 AM 05/20/2023    8:08 AM  GAD 7 : Generalized Anxiety Score  Nervous, Anxious, on Edge 1 2 1 1   Control/stop worrying 1 1 1 1   Worry too much - different things 1 1 1 1   Trouble relaxing 1 1 1 1   Restless 1 1 1 1   Easily annoyed or irritable 1 2 1 2   Afraid - awful might happen 1 0 1 1  Total GAD 7 Score 7 8 7 8   Anxiety Difficulty Somewhat difficult Somewhat difficult Somewhat difficult Somewhat difficult      Suicidal/Homicidal: Nowithout intent/plan  Plan: Return again in 2-4 weeks.   Diagnosis: Major depressive disorder, recurrent episode, moderate with anxious distress (HCC)  Collaboration of Care: Other sources will be identified.   Patient/Guardian was advised Release of Information must be obtained prior to any record release in order to collaborate their care with an outside provider. Patient/Guardian was advised  if they have not already done so to contact the registration department to sign all necessary forms in order for [place] to release information regarding their care.   Consent: Patient/Guardian gives verbal consent for treatment and assignment of benefits for services provided during this visit. Patient/Guardian expressed understanding and agreed to proceed.   Bynum Bellows, LCSW 09/07/2023

## 2023-09-08 ENCOUNTER — Ambulatory Visit (AMBULATORY_SURGERY_CENTER): Payer: Medicaid Other

## 2023-09-08 VITALS — Ht 62.0 in | Wt 140.0 lb

## 2023-09-08 DIAGNOSIS — Z1211 Encounter for screening for malignant neoplasm of colon: Secondary | ICD-10-CM

## 2023-09-08 MED ORDER — SUFLAVE 178.7 G PO SOLR
1.0000 | Freq: Once | ORAL | 0 refills | Status: AC
Start: 2023-09-08 — End: 2023-09-08

## 2023-09-08 NOTE — Progress Notes (Signed)
 Pt's name and DOB verified at the beginning of the pre-visit wit 2 identifiers  Pt denies any difficulty with ambulating,sitting, laying down or rolling side to side  Pt has no issues with ambulation   Pt has no issues moving head neck or swallowing  No egg or soy allergy known to patient   No issues known to pt with past sedation with any surgeries or procedures  Pt denies having issues being intubated  No FH of Malignant Hyperthermia  Pt is not on diet pills or shots  Pt is not on home 02   Pt is not on blood thinners   Pt denies issues with constipation   Pt is not on dialysis  Pt denise any abnormal heart rhythms   Pt denies any upcoming cardiac testing  Chart not reviewed by CRNA prior to PV  Visit by phone  Pt states weight is 140 lb  IInstructions reviewed. Pt given  [facility] main # and MD on call # prior to instructions.  Pt states understanding of instructions. Instructed to review again prior to procedure. Pt states they will.

## 2023-09-13 NOTE — Progress Notes (Signed)
 [facility] History and Physical   Primary Care Physician:  Gerre Scull, NP   Reason for Procedure:  Colon cancer screening  Plan:    Screening colonoscopy  HPI: Christy Haley is a 53 y.o. female undergoing screening colonoscopy for colon cancer screening.  This is the patient's first colonoscopy.  No family history of colorectal cancer or polyps.  Denies current symptoms of change in bowel habits or rectal bleeding.   Past Medical History:  Diagnosis Date   Anemia    Anxiety    Depression    GERD (gastroesophageal reflux disease)     Past Surgical History:  Procedure Laterality Date   AUGMENTATION MAMMAPLASTY  10/09/2015   breast lift and implants    INTRAUTERINE DEVICE INSERTION  2010   Mirena, removed & re-inserted 11-06-19   KNEE SURGERY Right    PELVIC LAPAROSCOPY     ovarian cystectomy   WRIST SURGERY Left 06/21/2019   plate/     Prior to Admission medications   Medication Sig Start Date End Date Taking? Authorizing Provider  buPROPion (WELLBUTRIN XL) 300 MG 24 hr tablet Take 1 tablet (300 mg total) by mouth daily. 03/28/23   McElwee, Jake Church, NP  COLLAGEN PO Take by mouth.    [provider]  ferrous sulfate 324 MG TBEC Take 324 mg by mouth. As needed    [provider]  Multiple Vitamin (MULTIVITAMIN) tablet Take 1 tablet by mouth daily.    [provider]  valACYclovir (VALTREX) 500 MG tablet Take 1 tablet (500 mg total) by mouth daily. Take 1 tab daily for prophylaxis 11/25/22   Genia Del, MD    Current Outpatient Medications  Medication Sig Dispense Refill   buPROPion (WELLBUTRIN XL) 300 MG 24 hr tablet Take 1 tablet (300 mg total) by mouth daily. 90 tablet 1   COLLAGEN PO Take by mouth.     ferrous sulfate 324 MG TBEC Take 324 mg by mouth. As needed     Multiple Vitamin (MULTIVITAMIN) tablet Take 1 tablet by mouth daily.     valACYclovir (VALTREX) 500 MG tablet Take 1 tablet (500 mg total) by mouth  daily. Take 1 tab daily for prophylaxis 90 tablet 4   Current Facility-Administered Medications  Medication Dose Route Frequency Provider Last Rate Last Admin   0.9 %  sodium chloride infusion  500 mL Intravenous Once Kiaya Haliburton, Durene Romans, MD       levonorgestrel [facility]) 20 MCG/24HR IUD   Intrauterine Once Ok Edwards, MD        Allergies as of 09/16/2023 - Review Complete 09/16/2023  Allergen Reaction Noted   Oxycodone Rash 11/03/2015    Family History  Problem Relation Age of Onset   Arthritis Mother    Hypertension Mother    Diabetes Maternal Grandmother    Colon polyps Neg Hx    Colon cancer Neg Hx    Esophageal cancer Neg Hx    Rectal cancer Neg Hx    Stomach cancer Neg Hx     Social History   Socioeconomic History   Marital status: Divorced    Spouse name: separated   Number of children: 2   Years of education: 12th grade   Highest education level: 9th grade  Occupational History   Occupation: Psychologist, clinical: UNEMPLOYED  Tobacco Use   Smoking status: Never   Smokeless tobacco: Never  Vaping Use   Vaping status: Never Used  Substance and Sexual Activity  Alcohol use: Yes    Comment: occ   Drug use: No   Sexual activity: Not Currently    Partners: Male    Birth control/protection: I.U.D.    Comment: Mirena IUD inserted 11-06-19  Other Topics Concern   Not on file  Social History Narrative   Not on file   Social Drivers of Health   Financial Resource Strain: Low Risk  (10/24/2022)   Overall Financial Resource Strain (CARDIA)    Difficulty of Paying Living Expenses: Not very hard  Food Insecurity: No Food Insecurity (10/24/2022)   Hunger Vital Sign    Worried About Running Out of Food in the Last Year: Never true    Ran Out of Food in the Last Year: Never true  Transportation Needs: No Transportation Needs (10/24/2022)   PRAPARE - Administrator, Civil Service (Medical): No    Lack of Transportation (Non-Medical): No  Physical  Activity: Sufficiently Active (10/24/2022)   Exercise Vital Sign    Days of Exercise per Week: 6 days    Minutes of Exercise per Session: 60 min  Stress: Stress Concern Present (10/24/2022)   [employer] of Occupational Health - Occupational Stress Questionnaire    Feeling of Stress : To some extent  Social Connections: Moderately Isolated (10/24/2022)   Social Connection and Isolation Panel [NHANES]    Frequency of Communication with Friends and Family: More than three times a week    Frequency of Social Gatherings with Friends and Family: Once a week    Attends Religious Services: More than 4 times per year    Active Member of [employer] or Organizations: No    Attends Engineer, structural: Not on file    Marital Status: Divorced  Intimate Partner Violence: Not on file    Review of Systems:  All other review of systems negative except as mentioned in the HPI.  Physical Exam: Vital signs BP (!) 100/57   Pulse 71   Temp 97.7 F (36.5 C)   Ht 5\' 2"  (1.575 m)   Wt 140 lb (63.5 kg)   SpO2 100%   BMI 25.61 kg/m   General:   Alert,  Well-developed, well-nourished, pleasant and cooperative in NAD Airway:  Mallampati 1 Lungs:  Clear throughout to auscultation.   Heart:  Regular rate and rhythm; no murmurs, clicks, rubs,  or gallops. Abdomen:  Soft, nontender and nondistended. Normal bowel sounds.   Neuro/Psych:  Normal mood and affect. A and O x 3  Maren Beach, MD [facility]

## 2023-09-16 ENCOUNTER — Ambulatory Visit: Payer: Medicaid Other | Admitting: Pediatrics

## 2023-09-16 ENCOUNTER — Encounter: Payer: Medicaid Other | Admitting: Pediatrics

## 2023-09-16 ENCOUNTER — Encounter: Payer: Self-pay | Admitting: Pediatrics

## 2023-09-16 VITALS — BP 108/80 | HR 61 | Temp 97.7°F | Resp 10 | Ht 62.0 in | Wt 140.0 lb

## 2023-09-16 DIAGNOSIS — D125 Benign neoplasm of sigmoid colon: Secondary | ICD-10-CM | POA: Diagnosis not present

## 2023-09-16 DIAGNOSIS — K648 Other hemorrhoids: Secondary | ICD-10-CM

## 2023-09-16 DIAGNOSIS — Z1211 Encounter for screening for malignant neoplasm of colon: Secondary | ICD-10-CM

## 2023-09-16 DIAGNOSIS — D122 Benign neoplasm of ascending colon: Secondary | ICD-10-CM

## 2023-09-16 MED ORDER — SODIUM CHLORIDE 0.9 % IV SOLN
500.0000 mL | Freq: Once | INTRAVENOUS | Status: DC
Start: 2023-09-16 — End: 2023-09-16

## 2023-09-16 NOTE — Progress Notes (Signed)
 Sedate, gd SR, tolerated procedure well, VSS, report to RN

## 2023-09-16 NOTE — Op Note (Signed)
 [facility] Patient Name: Christy Haley Procedure Date: 09/16/2023 8:23 AM MRN: [id number] Endoscopist: Maren Beach , MD, [id number] Age: 53 Referring MD:  Date of Birth: 04-09-1971 Gender: Female Account #: [id number] Procedure:                Colonoscopy Indications:              Screening for colorectal malignant neoplasm, This                            is the patient's first colonoscopy Medicines:                Monitored Anesthesia Care Procedure:                Pre-Anesthesia Assessment:                           - Prior to the procedure, a History and Physical                            was performed, and patient medications and                            allergies were reviewed. The patient's tolerance of                            previous anesthesia was also reviewed. The risks                            and benefits of the procedure and the sedation                            options and risks were discussed with the patient.                            All questions were answered, and informed consent                            was obtained. Prior Anticoagulants: The patient has                            taken no anticoagulant or antiplatelet agents. ASA                            Grade Assessment: II - A patient with mild systemic                            disease. After reviewing the risks and benefits,                            the patient was deemed in satisfactory condition to                            undergo the procedure.  After obtaining informed consent, the colonoscope                            was passed under direct vision. Throughout the                            procedure, the patient's blood pressure, pulse, and                            oxygen saturations were monitored continuously. The                            Olympus Scope SN: [id number] was introduced through                            the anus and advanced to  the cecum, identified by                            appendiceal orifice and ileocecal valve. The                            colonoscopy was performed without difficulty. The                            patient tolerated the procedure well. The quality                            of the bowel preparation was good. The ileocecal                            valve, appendiceal orifice, and rectum were                            photographed. Scope In: 8:31:29 AM Scope Out: 8:46:49 AM Scope Withdrawal Time: 0 hours 10 minutes 51 seconds  Total Procedure Duration: 0 hours 15 minutes 20 seconds  Findings:                 The perianal and digital rectal examinations were                            normal. Pertinent negatives include normal                            sphincter tone and no palpable rectal lesions.                           Two sessile polyps were found in the sigmoid colon                            and ascending colon. The polyps were 4 to 5 mm in                            size. These polyps were removed with a cold  snare.                            Resection and retrieval were complete.                           Internal hemorrhoids were found during retroflexion. Complications:            No immediate complications. Estimated blood loss:                            Minimal. Estimated Blood Loss:     Estimated blood loss was minimal. Impression:               - Two 4 to 5 mm polyps in the sigmoid colon and in                            the ascending colon, removed with a cold snare.                            Resected and retrieved.                           - Internal hemorrhoids. Recommendation:           - Discharge patient to home (ambulatory).                           - Await pathology results.                           - Repeat colonoscopy for surveillance based on                            pathology results.                           - The findings and recommendations  were discussed                            with the patient's family.                           - Return to referring physician.                           - Patient has a contact number available for                            emergencies. The signs and symptoms of potential                            delayed complications were discussed with the                            patient. Return to normal activities tomorrow.  Written discharge instructions were provided to the                            patient. Maren Beach, MD 09/16/2023 8:51:10 AM This report has been signed electronically.

## 2023-09-16 NOTE — Patient Instructions (Signed)
Handout given on polyps.  YOU HAD AN ENDOSCOPIC PROCEDURE TODAY AT THE [facility]:   Refer to the procedure report that was given to you for any specific questions about what was found during the examination.  If the procedure report does not answer your questions, please call your gastroenterologist to clarify.  If you requested that your care partner not be given the details of your procedure findings, then the procedure report has been included in a sealed envelope for you to review at your convenience later.  YOU SHOULD EXPECT: Some feelings of bloating in the abdomen. Passage of more gas than usual.  Walking can help get rid of the air that was put into your GI tract during the procedure and reduce the bloating. If you had a lower endoscopy (such as a colonoscopy or flexible sigmoidoscopy) you may notice spotting of blood in your stool or on the toilet paper. If you underwent a bowel prep for your procedure, you may not have a normal bowel movement for a few days.  Please Note:  You might notice some irritation and congestion in your nose or some drainage.  This is from the oxygen used during your procedure.  There is no need for concern and it should clear up in a day or so.  SYMPTOMS TO REPORT IMMEDIATELY:  Following lower endoscopy (colonoscopy or flexible sigmoidoscopy):  Excessive amounts of blood in the stool  Significant tenderness or worsening of abdominal pains  Swelling of the abdomen that is new, acute  Fever of 100F or higher   For urgent or emergent issues, a gastroenterologist can be reached at any hour by calling [phone]. Do not use MyChart messaging for urgent concerns.    DIET:  We do recommend a small meal at first, but then you may proceed to your regular diet.  Drink plenty of fluids but you should avoid alcoholic beverages for 24 hours.  ACTIVITY:  You should plan to take it easy for the rest of today and you should NOT DRIVE or use heavy  machinery until tomorrow (because of the sedation medicines used during the test).    FOLLOW UP: Our staff will call the number listed on your records the next business day following your procedure.  We will call around 7:15- 8:00 am to check on you and address any questions or concerns that you may have regarding the information given to you following your procedure. If we do not reach you, we will leave a message.     If any biopsies were taken you will be contacted by phone or by letter within the next 1-3 weeks.  Please call us at [phone] if you have not heard about the biopsies in 3 weeks.    SIGNATURES/CONFIDENTIALITY: You and/or your care partner have signed paperwork which will be entered into your electronic medical record.  These signatures attest to the fact that that the information above on your After Visit Summary has been reviewed and is understood.  Full responsibility of the confidentiality of this discharge information lies with you and/or your care-partner. 

## 2023-09-16 NOTE — Progress Notes (Signed)
 Pt's states no medical or surgical changes since previsit or office visit.

## 2023-09-16 NOTE — Progress Notes (Signed)
 Called to room to assist during endoscopic procedure.  Patient ID and intended procedure confirmed with present staff. Received instructions for my participation in the procedure from the performing physician.

## 2023-09-19 ENCOUNTER — Telehealth: Payer: Self-pay

## 2023-09-19 NOTE — Telephone Encounter (Signed)
 No answer after follow up call. Voice message left.

## 2023-09-20 ENCOUNTER — Encounter: Payer: Self-pay | Admitting: Pediatrics

## 2023-09-20 LAB — SURGICAL PATHOLOGY

## 2023-09-21 ENCOUNTER — Ambulatory Visit (INDEPENDENT_AMBULATORY_CARE_PROVIDER_SITE_OTHER): Payer: PRIVATE HEALTH INSURANCE | Admitting: Licensed Clinical Social Worker

## 2023-09-21 DIAGNOSIS — F331 Major depressive disorder, recurrent, moderate: Secondary | ICD-10-CM | POA: Diagnosis not present

## 2023-09-21 NOTE — Progress Notes (Unsigned)
 THERAPIST PROGRESS NOTE  Session Time: 8:07 am-8:48 am  Type of Therapy: Individual Therapy  Session#26  Purpose of Session: Ly will manage mood and anxiety as evidenced by improving self esteem, challenging anxious and depressive thoughts, managing racing thoughts/overthinking, cope with loss, reducing people pleasing, and improving sleep for 5 out of 7 days for 60 days.  ProgressTowards Goals: Progressing  Interventions: Therapist utilized CBT, Solution focused brief therapy, and ACT to address anxiety and depression. Therapist provided support and empathy to patient during session. Therapist administered the PHQ9 and GAD7 to patient during session. Therapist worked with patient on challenging overthinking.    Effectiveness: Patient was oriented x4 (person, place, situation, and time). Patient was casually dressed, and appropriately groomed. Patient was alert, engaged, pleasant, and cooperative. Patient completed a PHQ9 and GAD7. Patient noted that she gets triggered with the feeling of being controlled. She noted that her daughters and friend wanted to add patient to "Life 360" which tracks location. Patient's friend took her phone and added herself. Her daughters have made comments about where she has been or her coming back late. Patient admits they say it for fun but she takes offense. Patient was able to identify another reason other than control why her daughters and friend might want her to have "life 360" which they are worried about her and want her to be safe. Patient is going to take a step back when she has these thoughts to reality check them.    Patient engaged in session. Patient responded well to interventions. Patient continues to meet criteria for Major depressive disorder, recurrent episode, moderate with anxious distress. Patient will continue in outpatient therapy due to being the least restrictive service to meet her needs. Patient made moderate progress.      09/21/2023    8:10 AM 09/07/2023    8:10 AM 07/14/2023    8:08 AM  Depression screen PHQ 2/9  Decreased Interest 1 1 1   Down, Depressed, Hopeless 1 2 1   PHQ - 2 Score 2 3 2   Altered sleeping 1 1 2   Tired, decreased energy 1 1 1   Change in appetite 1 0 1  Feeling bad or failure about yourself  1 1 1   Trouble concentrating 1 1 1   Moving slowly or fidgety/restless 1 1 1   Suicidal thoughts 0 0 0  PHQ-9 Score 8 8 9        09/21/2023    8:11 AM 09/07/2023    8:11 AM 07/14/2023    8:08 AM 06/03/2023    8:19 AM  GAD 7 : Generalized Anxiety Score  Nervous, Anxious, on Edge 2 1 2 1   Control/stop worrying 1 1 1 1   Worry too much - different things 1 1 1 1   Trouble relaxing 1 1 1 1   Restless 1 1 1 1   Easily annoyed or irritable 1 1 2 1   Afraid - awful might happen 0 1 0 1  Total GAD 7 Score 7 7 8 7   Anxiety Difficulty Somewhat difficult Somewhat difficult Somewhat difficult Somewhat difficult      Suicidal/Homicidal: Nowithout intent/plan  Plan: Return again in 2-4 weeks.   Diagnosis: Major depressive disorder, recurrent episode, moderate with anxious distress (HCC)  Collaboration of Care: Other sources will be identified.   Patient/Guardian was advised Release of Information must be obtained prior to any record release in order to collaborate their care with an outside provider. Patient/Guardian was advised if they have not already done so to contact the registration  department to sign all necessary forms in order for [place] to release information regarding their care.   Consent: Patient/Guardian gives verbal consent for treatment and assignment of benefits for services provided during this visit. Patient/Guardian expressed understanding and agreed to proceed.   Bynum Bellows, LCSW 09/21/2023

## 2024-04-30 ENCOUNTER — Other Ambulatory Visit: Payer: Self-pay | Admitting: Nurse Practitioner

## 2024-04-30 DIAGNOSIS — Z1231 Encounter for screening mammogram for malignant neoplasm of breast: Secondary | ICD-10-CM

## 2024-05-01 ENCOUNTER — Ambulatory Visit
Admission: RE | Admit: 2024-05-01 | Discharge: 2024-05-01 | Disposition: A | Discharge status: Home / Self Care | Source: Ambulatory Visit | Attending: Nurse Practitioner | Admitting: Nurse Practitioner

## 2024-05-01 DIAGNOSIS — Z1231 Encounter for screening mammogram for malignant neoplasm of breast: Secondary | ICD-10-CM

## 2024-05-03 ENCOUNTER — Ambulatory Visit: Payer: Self-pay | Admitting: Nurse Practitioner

## 2024-05-14 ENCOUNTER — Encounter: Payer: Self-pay | Admitting: Radiology

## 2024-06-28 ENCOUNTER — Other Ambulatory Visit: Payer: Self-pay | Admitting: Nurse Practitioner

## 2024-06-28 DIAGNOSIS — F3341 Major depressive disorder, recurrent, in partial remission: Secondary | ICD-10-CM

## 2024-06-28 DIAGNOSIS — F411 Generalized anxiety disorder: Secondary | ICD-10-CM

## 2024-06-28 NOTE — Telephone Encounter (Signed)
 Requesting: BUPROPION  XL 300MG  TABLETS  Last Visit: 07/19/2023 Next Visit: 07/30/2024 Last Refill: 03/28/2023  Please Advise

## 2024-07-10 ENCOUNTER — Ambulatory Visit (INDEPENDENT_AMBULATORY_CARE_PROVIDER_SITE_OTHER): Admitting: Radiology

## 2024-07-10 ENCOUNTER — Encounter: Payer: Self-pay | Admitting: Radiology

## 2024-07-10 VITALS — BP 114/68 | HR 71 | Ht 61.0 in | Wt 131.0 lb

## 2024-07-10 DIAGNOSIS — Z975 Presence of (intrauterine) contraceptive device: Secondary | ICD-10-CM

## 2024-07-10 DIAGNOSIS — Z1331 Encounter for screening for depression: Secondary | ICD-10-CM | POA: Diagnosis not present

## 2024-07-10 DIAGNOSIS — Z01419 Encounter for gynecological examination (general) (routine) without abnormal findings: Secondary | ICD-10-CM | POA: Diagnosis not present

## 2024-07-10 DIAGNOSIS — B009 Herpesviral infection, unspecified: Secondary | ICD-10-CM | POA: Diagnosis not present

## 2024-07-10 MED ORDER — VALACYCLOVIR HCL 500 MG PO TABS: 500.0000 mg | ORAL_TABLET | Freq: Every day | ORAL | 4 refills | Status: AC

## 2024-07-10 NOTE — Progress Notes (Signed)
 "  Christy Haley 1971/07/03 [id number]   History:  53 y.o. G3P2 presents for annual exam. Has not been taking valtrex  for suppression, has been having outbreaks every other month.   Gynecologic History No LMP recorded. (Menstrual status: IUD). Period Cycle (Days):  (amenorrhea with Mirena ) Contraception/Family planning: IUD Sexually active: yes Last Pap: 2024. Results were: normal Last mammogram: 10/25. Results were: normal  Obstetric History OB History  Gravida Para Term Preterm AB Living  3 2 0  1 2  SAB IAB Ectopic Multiple Live Births  1    2    # Outcome Date GA Lbr Len/2nd Weight Sex Type Anes PTL Lv  3 SAB           2 Para     F Vag-Spont     1 Para     F Vag-Spont          07/10/2024    1:33 PM 09/21/2023    8:10 AM 09/07/2023    8:10 AM  Depression screen PHQ 2/9  Decreased Interest 1    Down, Depressed, Hopeless 1    PHQ - 2 Score 2    Altered sleeping 1    Tired, decreased energy 1    Change in appetite 1    Feeling bad or failure about yourself  1    Trouble concentrating 0    Moving slowly or fidgety/restless 0    Suicidal thoughts 0    PHQ-9 Score 6    Difficult doing work/chores Not difficult at all       Information is confidential and restricted. Go to Review Flowsheets to unlock data.     The following portions of the patient's history were reviewed and updated as appropriate: allergies, current medications, past family history, past medical history, past social history, past surgical history, and problem list.  Review of Systems  All other systems reviewed and are negative.   Past medical history, past surgical history, family history and social history were all reviewed and documented in the EPIC chart.  Exam:  Vitals:   07/10/24 1330  BP: 114/68  Pulse: 71  SpO2: 99%  Weight: 131 lb (59.4 kg)  Height: 5' 1 (1.549 m)   Body mass index is 24.75 kg/m.  Physical Exam Vitals and nursing note reviewed. Exam conducted with a chaperone  present.  Constitutional:      Appearance: Normal appearance. She is normal weight.  HENT:     Head: Normocephalic and atraumatic.  Neck:     Thyroid : No thyroid  mass, thyromegaly or thyroid  tenderness.  Cardiovascular:     Rate and Rhythm: Regular rhythm.     Heart sounds: Normal heart sounds.  Pulmonary:     Effort: Pulmonary effort is normal.     Breath sounds: Normal breath sounds.  Chest:  Breasts:    Breasts are symmetrical.     Right: Normal. No inverted nipple, mass, nipple discharge, skin change or tenderness.     Left: Normal. No inverted nipple, mass, nipple discharge, skin change or tenderness.  Abdominal:     General: Abdomen is flat. Bowel sounds are normal.     Palpations: Abdomen is soft.  Genitourinary:    General: Normal vulva.     Vagina: Normal. No vaginal discharge, bleeding or lesions.     Cervix: Normal. No discharge or lesion.     Uterus: Normal. Not enlarged and not tender.      Adnexa: Right adnexa normal and left adnexa normal.  Right: No mass, tenderness or fullness.         Left: No mass, tenderness or fullness.    Lymphadenopathy:     Upper Body:     Right upper body: No axillary adenopathy.     Left upper body: No axillary adenopathy.  Skin:    General: Skin is warm and dry.  Neurological:     Mental Status: She is alert and oriented to person, place, and time.  Psychiatric:        Mood and Affect: Mood normal.        Thought Content: Thought content normal.        Judgment: Judgment normal.      Darice Hoit, CMA present for exam  Assessment/Plan:   1. Well woman exam with routine gynecological exam (Primary) Pap 2027 Mammo yearly  2. HSV (herpes simplex virus) infection Valtrex  suppressive therapy, Rx sent.  3. IUD (intrauterine device) in place May remain until 2029  4. Depression screen Managed by outside provider    Return in about 1 year (around 07/10/2025) for Annual.  GINETTE COZIER B WHNP-BC 2:06 PM  07/10/2024 "

## 2024-07-27 NOTE — Progress Notes (Signed)
 "  BP 94/62 (BP Location: Left Arm, Patient Position: Sitting, Cuff Size: Normal)   Pulse 63   Temp (!) 96.9 F (36.1 C)   Ht 5' 1 (1.549 m)   Wt 132 lb 3.2 oz (60 kg)   SpO2 100%   BMI 24.98 kg/m    Subjective:    Patient ID: Christy Haley, female    DOB: 1970-12-10, 54 y.o.   MRN: [id number]  CC: Chief Complaint  Patient presents with   Annual Exam    With fasting labs, Rx refill, GYN concerns    HPI: Christy Haley is a 54 y.o. female presenting on 07/30/2024 for comprehensive medical examination. Current medical complaints include:none  She currently lives with: alone Menopausal Symptoms: no  Depression and Anxiety Screen done today and results listed below:     07/30/2024    8:21 AM 07/10/2024    1:33 PM 09/21/2023    8:10 AM 09/07/2023    8:10 AM 07/14/2023    8:08 AM  Depression screen PHQ 2/9  Decreased Interest 1 1     Down, Depressed, Hopeless 2 1     PHQ - 2 Score 3 2     Altered sleeping 2 1     Tired, decreased energy 2 1     Change in appetite 1 1     Feeling bad or failure about yourself  0 1     Trouble concentrating 1 0     Moving slowly or fidgety/restless 0 0     Suicidal thoughts 0 0     PHQ-9 Score 9 6     Difficult doing work/chores  Not difficult at all        Information is confidential and restricted. Go to Review Flowsheets to unlock data.      07/30/2024    8:21 AM 09/21/2023    8:11 AM 09/07/2023    8:11 AM 07/14/2023    8:08 AM  GAD 7 : Generalized Anxiety Score  Nervous, Anxious, on Edge 2     Control/stop worrying 1     Worry too much - different things 1     Trouble relaxing 2     Restless 1     Easily annoyed or irritable 1     Afraid - awful might happen 0     Total GAD 7 Score 8     Anxiety Difficulty         Information is confidential and restricted. Go to Review Flowsheets to unlock data.    The patient does not have a history of falls. I did not complete a risk assessment for falls. A plan of care for falls was not  documented.   Past Medical History:  Past Medical History:  Diagnosis Date   Anemia    Anxiety    Depression    Genital herpes    GERD (gastroesophageal reflux disease)     Surgical History:  Past Surgical History:  Procedure Laterality Date   AUGMENTATION MAMMAPLASTY  10/09/2015   breast lift and implants    INTRAUTERINE DEVICE INSERTION  2010   Mirena , removed & re-inserted 11-06-19   KNEE SURGERY Right    PELVIC LAPAROSCOPY     ovarian cystectomy   WRIST SURGERY Left 06/21/2019   plate/     Medications:  Current Outpatient Medications on File Prior to Visit  Medication Sig   COLLAGEN PO Take by mouth.   ferrous sulfate 324 MG TBEC Take 324 mg by mouth.  As needed   Multiple Vitamin (MULTIVITAMIN) tablet Take 1 tablet by mouth daily.   valACYclovir  (VALTREX ) 500 MG tablet Take 1 tablet (500 mg total) by mouth daily. Take 1 tab daily for prophylaxis   Current Facility-Administered Medications on File Prior to Visit  Medication   levonorgestrel  (MIRENA ) 20 MCG/24HR IUD    Allergies:  Allergies[1]  Social History:  Social History   Socioeconomic History   Marital status: Divorced    Spouse name: separated   Number of children: 2   Years of education: 12th grade   Highest education level: 10th grade  Occupational History   Occupation: Psychologist, Clinical: UNEMPLOYED  Tobacco Use   Smoking status: Never    Passive exposure: Never   Smokeless tobacco: Never  Vaping Use   Vaping status: Never Used  Substance and Sexual Activity   Alcohol use: Yes    Comment: occ   Drug use: Never   Sexual activity: Not Currently    Partners: Male    Birth control/protection: I.U.D.    Comment: Mirena  IUD inserted 11-06-19  Other Topics Concern   Not on file  Social History Narrative   Not on file   Social Drivers of Health   Tobacco Use: Low Risk (07/30/2024)   Patient History    Smoking Tobacco Use: Never    Smokeless Tobacco Use: Never    Passive Exposure:  Never  Financial Resource Strain: Medium Risk (07/29/2024)   Overall Financial Resource Strain (CARDIA)    Difficulty of Paying Living Expenses: Somewhat hard  Food Insecurity: No Food Insecurity (07/29/2024)   Epic    Worried About Programme Researcher, Broadcasting/film/video in the Last Year: Never true    Ran Out of Food in the Last Year: Never true  Transportation Needs: No Transportation Needs (07/29/2024)   Epic    Lack of Transportation (Medical): No    Lack of Transportation (Non-Medical): No  Physical Activity: Sufficiently Active (07/29/2024)   Exercise Vital Sign    Days of Exercise per Week: 6 days    Minutes of Exercise per Session: 60 min  Stress: Stress Concern Present (07/29/2024)   [employer] of Occupational Health - Occupational Stress Questionnaire    Feeling of Stress: To some extent  Social Connections: Moderately Integrated (07/29/2024)   Social Connection and Isolation Panel    Frequency of Communication with Friends and Family: Three times a week    Frequency of Social Gatherings with Friends and Family: Once a week    Attends Religious Services: More than 4 times per year    Active Member of Clubs or Organizations: Yes    Attends Banker Meetings: 1 to 4 times per year    Marital Status: Divorced  Intimate Partner Violence: Not on file  Depression (PHQ2-9): Medium Risk (07/30/2024)   Depression (PHQ2-9)    PHQ-2 Score: 9  Alcohol Screen: Low Risk (10/24/2022)   Alcohol Screen    Last Alcohol Screening Score (AUDIT): 2  Housing: Unknown (07/29/2024)   Epic    Unable to Pay for Housing in the Last Year: No    Number of Times Moved in the Last Year: Not on file    Homeless in the Last Year: No  Utilities: Not on file  Health Literacy: Not on file   Tobacco Use History[2] Social History   Substance and Sexual Activity  Alcohol Use Yes   Comment: occ    Family History:  Family History  Problem  Relation Age of Onset   Arthritis Mother    Hypertension Mother     Diabetes Maternal Grandmother    Colon polyps Neg Hx    Colon cancer Neg Hx    Esophageal cancer Neg Hx    Rectal cancer Neg Hx    Stomach cancer Neg Hx     Past medical history, surgical history, medications, allergies, family history and social history reviewed with patient today and changes made to appropriate areas of the chart.   Review of Systems  Constitutional: Negative.   HENT: Negative.    Eyes: Negative.   Respiratory: Negative.    Cardiovascular: Negative.   Gastrointestinal: Negative.   Genitourinary: Negative.   Musculoskeletal: Negative.   Skin: Negative.   Neurological: Negative.   Psychiatric/Behavioral:  Positive for depression. The patient is not nervous/anxious.    All other ROS negative except what is listed above and in the HPI.      Objective:    BP 94/62 (BP Location: Left Arm, Patient Position: Sitting, Cuff Size: Normal)   Pulse 63   Temp (!) 96.9 F (36.1 C)   Ht 5' 1 (1.549 m)   Wt 132 lb 3.2 oz (60 kg)   SpO2 100%   BMI 24.98 kg/m   Wt Readings from Last 3 Encounters:  07/30/24 132 lb 3.2 oz (60 kg)  07/10/24 131 lb (59.4 kg)  09/16/23 140 lb (63.5 kg)    Physical Exam Vitals and nursing note reviewed.  Constitutional:      General: She is not in acute distress.    Appearance: Normal appearance.  HENT:     Head: Normocephalic and atraumatic.     Right Ear: Tympanic membrane, ear canal and external ear normal.     Left Ear: Tympanic membrane, ear canal and external ear normal.     Mouth/Throat:     Mouth: Mucous membranes are moist.     Pharynx: No posterior oropharyngeal erythema.  Eyes:     Conjunctiva/sclera: Conjunctivae normal.  Cardiovascular:     Rate and Rhythm: Normal rate and regular rhythm.     Pulses: Normal pulses.     Heart sounds: Normal heart sounds.  Pulmonary:     Effort: Pulmonary effort is normal.     Breath sounds: Normal breath sounds.  Abdominal:     Palpations: Abdomen is soft.     Tenderness:  There is no abdominal tenderness.  Musculoskeletal:        General: Normal range of motion.     Cervical back: Normal range of motion and neck supple.     Right lower leg: No edema.     Left lower leg: No edema.  Lymphadenopathy:     Cervical: No cervical adenopathy.  Skin:    General: Skin is warm and dry.  Neurological:     General: No focal deficit present.     Mental Status: She is alert and oriented to person, place, and time.     Cranial Nerves: No cranial nerve deficit.     Coordination: Coordination normal.     Gait: Gait normal.  Psychiatric:        Mood and Affect: Mood normal.        Behavior: Behavior normal.        Thought Content: Thought content normal.        Judgment: Judgment normal.     Results for orders placed or performed in visit on 09/16/23  Surgical pathology ([facility])   Collection  Time: 09/16/23 12:00 AM  Result Value Ref Range   SURGICAL PATHOLOGY      SURGICAL PATHOLOGY [facility] [address], [address] [place], [place] [address] Telephone [phone] or [phone] Fax [phone]  REPORT OF SURGICAL PATHOLOGY   Accession #: [id number] Patient Name: JORDANNA, HENDRIE Visit # : [id number]  MRN: [id number] Physician: Suzann Mom DOB/Age 03-Mar-1971 (Age: 7) Gender: F Collected Date: 09/16/2023 Received Date: 09/19/2023  FINAL DIAGNOSIS       1. 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       DATE SIGNED OUT: 09/20/2023 ELECTRONIC SIGNATURE : Belvie Come, John, Pathologist, Electronic Signature  MICROSCOPIC DESCRIPTION  CASE COMMENTS STAINS USED IN DIAGNOSIS: H&E    CLINICAL HISTORY  SPECIMEN(S) OBTAINED 1. Surgical [P], Colon, Sigmoid And Ascending, Polyp (2)  SPECIMEN COMMENTS: 1. Special screening for malignant neoplasms, colon; benign neoplasm of sigmoid colon; benign neoplasm of ascending co lon SPECIMEN CLINICAL  INFORMATION: 1. R/O adenoma    Gross Description 1. Received in formalin are tan, soft tissue fragments that are submitted in toto. Number: 2, Size: 0.3 cm smallest to 0.5 cm largest, (1B) ( TA )        Report signed out from the following location(s) [facility] [address] [place], [place] [address] CLIA #: [id number]  [facility] [address] [place], [place] [address] CLIA #: [id number]       Assessment & Plan:   Problem List Items Addressed This Visit       Other   Routine general medical examination at a health care facility - Primary   Health maintenance reviewed and updated. Discussed nutrition, exercise. Follow-up 1 year.        Generalized anxiety disorder   Chronic, stable.  Continue Wellbutrin  XL 300 mg daily. Continue following with therapist as well.        Relevant Medications   buPROPion  (WELLBUTRIN  XL) 300 MG 24 hr tablet   Recurrent major depressive disorder, in partial remission   Chronic, stable.  Continue Wellbutrin  XL 300 mg daily. Continue following with therapist as well.  Follow-up in 1 year.      Relevant Medications   buPROPion  (WELLBUTRIN  XL) 300 MG 24 hr tablet   Other Visit Diagnoses       Immunization due       Flu vaccine given today   Relevant Orders   Flu vaccine trivalent PF, 6mos and older(Flulaval,Afluria,Fluarix,Fluzone) (Completed)        Follow up plan: Return in about 1 year (around 07/30/2025) for CPE.   LABORATORY TESTING:  - Pap smear: up to date  IMMUNIZATIONS:   - Tdap: Tetanus vaccination status reviewed: declined. - Influenza: Administered today - Pneumovax: Not applicable - Prevnar: Declined - HPV: Up to date - Shingrix  vaccine: Up to date  SCREENING: -Mammogram: Up to date  - Colonoscopy: Up to date  - Bone Density: Not applicable   PATIENT COUNSELING:   Advised to take 1 mg of folate supplement per day if capable of pregnancy.   Sexuality:  Discussed sexually transmitted diseases, partner selection, use of condoms, avoidance of unintended pregnancy  and contraceptive alternatives.   Advised to avoid cigarette smoking.  I discussed with the patient that most people either abstain from alcohol or drink within safe limits (<=14/week and <=4 drinks/occasion for males, <=7/weeks and <= 3 drinks/occasion  for females) and that the risk for alcohol disorders and other health effects rises proportionally with the number of drinks per week and how often a drinker exceeds daily limits.  Discussed cessation/primary prevention of drug use and availability of treatment for abuse.   Diet: Encouraged to adjust caloric intake to maintain  or achieve ideal body weight, to reduce intake of dietary saturated fat and total fat, to limit sodium intake by avoiding high sodium foods and not adding table salt, and to maintain adequate dietary potassium and calcium preferably from fresh fruits, vegetables, and low-fat dairy products.    stressed the importance of regular exercise  Injury prevention: Discussed safety belts, safety helmets, smoke detector, smoking near bedding or upholstery.   Dental health: Discussed importance of regular tooth brushing, flossing, and dental visits.    NEXT PREVENTATIVE PHYSICAL DUE IN 1 YEAR. Return in about 1 year (around 07/30/2025) for CPE.  Tinnie DELENA Harada, NP  I,Emily Lagle,acting as a scribe for [employer], NP.,have documented all relevant documentation on the behalf of Valeree Leidy DELENA Harada, NP.  I, Tinnie DELENA Harada, NP, have reviewed all documentation for this visit. The documentation on 07/30/2024 for the exam, diagnosis, procedures, and orders are all accurate and complete.     [1]  Allergies Allergen Reactions   Oxycodone Rash  [2]  Social History Tobacco Use  Smoking Status Never   Passive exposure: Never  Smokeless Tobacco Never   "

## 2024-07-27 NOTE — Assessment & Plan Note (Addendum)
 Chronic, stable.  Continue Wellbutrin  XL 300 mg daily. Continue following with therapist as well.

## 2024-07-27 NOTE — Assessment & Plan Note (Addendum)
 Chronic, stable.  Continue Wellbutrin  XL 300 mg daily. Continue following with therapist as well.  Follow-up in 1 year.

## 2024-07-30 ENCOUNTER — Ambulatory Visit (INDEPENDENT_AMBULATORY_CARE_PROVIDER_SITE_OTHER): Admitting: Nurse Practitioner

## 2024-07-30 ENCOUNTER — Encounter: Payer: Self-pay | Admitting: Nurse Practitioner

## 2024-07-30 VITALS — BP 94/62 | HR 63 | Temp 96.9°F | Ht 61.0 in | Wt 132.2 lb

## 2024-07-30 DIAGNOSIS — Z23 Encounter for immunization: Secondary | ICD-10-CM

## 2024-07-30 DIAGNOSIS — Z0000 Encounter for general adult medical examination without abnormal findings: Secondary | ICD-10-CM | POA: Diagnosis not present

## 2024-07-30 DIAGNOSIS — F3341 Major depressive disorder, recurrent, in partial remission: Secondary | ICD-10-CM | POA: Diagnosis not present

## 2024-07-30 DIAGNOSIS — F411 Generalized anxiety disorder: Secondary | ICD-10-CM

## 2024-07-30 MED ORDER — BUPROPION HCL ER (XL) 300 MG PO TB24: 300.0000 mg | ORAL_TABLET | Freq: Every day | ORAL | 3 refills | Status: AC

## 2024-07-30 NOTE — Patient Instructions (Signed)
 It was great to see you!  I have refilled your wellbutrin    We will update your flu shot today   Let's follow-up in 1 year, sooner if you have concerns.  If a referral was placed today, you will be contacted for an appointment. Please note that routine referrals can sometimes take up to 3-4 weeks to process. Please call our office if you haven't heard anything after this time frame.  Take care,  Tinnie Harada, NP

## 2024-07-30 NOTE — Assessment & Plan Note (Signed)
 Health maintenance reviewed and updated. Discussed nutrition, exercise. Follow-up 1 year.

## 2024-08-01 ENCOUNTER — Ambulatory Visit: Admitting: Orthopaedic Surgery

## 2024-08-08 ENCOUNTER — Ambulatory Visit: Admitting: Orthopaedic Surgery

## 2024-08-09 ENCOUNTER — Ambulatory Visit (INDEPENDENT_AMBULATORY_CARE_PROVIDER_SITE_OTHER): Admitting: Orthopaedic Surgery

## 2024-08-09 ENCOUNTER — Other Ambulatory Visit: Payer: Self-pay

## 2024-08-09 DIAGNOSIS — M1711 Unilateral primary osteoarthritis, right knee: Secondary | ICD-10-CM | POA: Diagnosis not present

## 2024-08-09 NOTE — Progress Notes (Signed)
 "  Office Visit Note   Patient: Christy Haley           Date of Birth: [birth year]           MRN: [id number] Visit Date: 08/09/2024              Requested by: Nedra Tinnie LABOR, NP [address] [place],  [place] [address] PCP: Nedra Tinnie LABOR, NP   Assessment & Plan: Visit Diagnoses:  1. Primary osteoarthritis of right knee     Plan: Impression is stable right knee osteoarthritis.  Treatment options again reviewed and sounds like she has gotten the most relief from viscosupplementation.  Will get approval for her.  Will call her to schedule a time to come in for an injection.  This patient is diagnosed with osteoarthritis of the knee(s).    Radiographs show evidence of joint space narrowing, osteophytes, subchondral sclerosis and/or subchondral cysts.  This patient has knee pain which interferes with functional and activities of daily living.    This patient has experienced inadequate response, adverse effects and/or intolerance with conservative treatments such as acetaminophen , NSAIDS, topical creams, physical therapy or regular exercise, knee bracing and/or weight loss.   This patient has experienced inadequate response or has a contraindication to intra articular steroid injections for at least 3 months.   This patient is not scheduled to have a total knee replacement within 6 months of starting treatment with viscosupplementation.   Follow-Up Instructions: Return if symptoms worsen or fail to improve.   Orders:  Orders Placed This Encounter  Procedures   Ambulatory request for injection medication   Ambulatory request for injection medication   No orders of the defined types were placed in this encounter.     Procedures: No procedures performed   Clinical Data: No additional findings.   Subjective: Chief Complaint  Patient presents with   Right Knee - Follow-up    Trivisc #3    HPI Patient returns today for follow-up evaluation of right knee  osteoarthritis.  She has questions about PRP injection and other treatment options. Review of Systems  Constitutional: Negative.   HENT: Negative.    Eyes: Negative.   Respiratory: Negative.    Cardiovascular: Negative.   Endocrine: Negative.   Musculoskeletal: Negative.   Neurological: Negative.   Hematological: Negative.   Psychiatric/Behavioral: Negative.    All other systems reviewed and are negative.    Objective: Vital Signs: There were no vitals taken for this visit.  Physical Exam Vitals and nursing note reviewed.  Constitutional:      Appearance: She is well-developed.  HENT:     Head: Atraumatic.     Nose: Nose normal.  Eyes:     Extraocular Movements: Extraocular movements intact.  Cardiovascular:     Pulses: Normal pulses.  Pulmonary:     Effort: Pulmonary effort is normal.  Abdominal:     Palpations: Abdomen is soft.  Musculoskeletal:     Cervical back: Neck supple.  Skin:    General: Skin is warm.     Capillary Refill: Capillary refill takes less than 2 seconds.  Neurological:     Mental Status: She is alert. Mental status is at baseline.  Psychiatric:        Behavior: Behavior normal.        Thought Content: Thought content normal.        Judgment: Judgment normal.     Ortho Exam Examination of right knee shows no joint effusion.  Preserved range  of motion.  Collaterals and cruciates are stable. Specialty Comments:  No specialty comments available.  Imaging: No results found.   PMFS History: Patient Active Problem List   Diagnosis Date Noted   Gastroenteritis 07/19/2023   Dizziness 06/11/2022   Unilateral primary osteoarthritis, right knee 11/25/2021   Insomnia 11/20/2021   Recurrent major depressive disorder, in partial remission 01/12/2017   Generalized anxiety disorder 02/20/2015   IUD (intrauterine device) in place 08/12/2014   Lesion of cervix 06/21/2014   Hyperglycemia 04/24/2013   Muscle tiredness 04/12/2013   Alopecia  04/12/2013   Loss of weight 04/12/2013   Hot flushes, perimenopausal 04/12/2013   Iron deficiency anemia 12/07/2012   Left lumbar radiculitis 12/07/2012   Routine general medical examination at a health care facility 12/07/2012   Leukopenia 01/14/2012   GERD 01/06/2012   Past Medical History:  Diagnosis Date   Anemia    Anxiety    Depression    Genital herpes    GERD (gastroesophageal reflux disease)     Family History  Problem Relation Age of Onset   Arthritis Mother    Hypertension Mother    Diabetes Maternal Grandmother    Colon polyps Neg Hx    Colon cancer Neg Hx    Esophageal cancer Neg Hx    Rectal cancer Neg Hx    Stomach cancer Neg Hx     Past Surgical History:  Procedure Laterality Date   AUGMENTATION MAMMAPLASTY  10/09/2015   breast lift and implants    INTRAUTERINE DEVICE INSERTION  2010   Mirena , removed & re-inserted 11-06-19   KNEE SURGERY Right    PELVIC LAPAROSCOPY     ovarian cystectomy   WRIST SURGERY Left 06/21/2019   plate/    Social History   Occupational History   Occupation: Psychologist, Clinical: UNEMPLOYED  Tobacco Use   Smoking status: Never    Passive exposure: Never   Smokeless tobacco: Never  Vaping Use   Vaping status: Never Used  Substance and Sexual Activity   Alcohol use: Yes    Comment: occ   Drug use: Never   Sexual activity: Not Currently    Partners: Male    Birth control/protection: I.U.D.    Comment: Mirena  IUD inserted 11-06-19        "

## 2025-07-23 ENCOUNTER — Ambulatory Visit: Admitting: Radiology
